# Patient Record
Sex: Male | Born: 1975 | Race: Asian | Hispanic: No | Marital: Married | Smoking: Former smoker
Health system: Southern US, Community
[De-identification: ages and names within clinical notes are randomized; demographics above are authoritative.]

## PROBLEM LIST (undated history)

## (undated) DIAGNOSIS — E785 Hyperlipidemia, unspecified: Secondary | ICD-10-CM

## (undated) DIAGNOSIS — I639 Cerebral infarction, unspecified: Secondary | ICD-10-CM

## (undated) DIAGNOSIS — J45909 Unspecified asthma, uncomplicated: Secondary | ICD-10-CM

## (undated) DIAGNOSIS — E119 Type 2 diabetes mellitus without complications: Secondary | ICD-10-CM

## (undated) HISTORY — PX: APPENDECTOMY: SHX54

## (undated) HISTORY — DX: Cerebral infarction, unspecified: I63.9

## (undated) HISTORY — PX: OPEN REDUCTION SHOULDER DISLOCATION: SUR900

## (undated) HISTORY — DX: Type 2 diabetes mellitus without complications: E11.9

## (undated) HISTORY — DX: Unspecified asthma, uncomplicated: J45.909

## (undated) HISTORY — PX: LEG SURGERY: SHX1003

## (undated) HISTORY — DX: Hyperlipidemia, unspecified: E78.5

---

## 2014-09-23 ENCOUNTER — Ambulatory Visit (INDEPENDENT_AMBULATORY_CARE_PROVIDER_SITE_OTHER): Payer: Managed Care, Other (non HMO) | Admitting: Urgent Care

## 2014-09-23 VITALS — BP 133/86 | HR 64 | Temp 97.9°F | Resp 20 | Ht 70.25 in | Wt 241.5 lb

## 2014-09-23 DIAGNOSIS — Z0000 Encounter for general adult medical examination without abnormal findings: Secondary | ICD-10-CM

## 2014-09-23 DIAGNOSIS — K5900 Constipation, unspecified: Secondary | ICD-10-CM

## 2014-09-23 DIAGNOSIS — M7631 Iliotibial band syndrome, right leg: Secondary | ICD-10-CM

## 2014-09-23 DIAGNOSIS — E669 Obesity, unspecified: Secondary | ICD-10-CM

## 2014-09-23 DIAGNOSIS — Z8679 Personal history of other diseases of the circulatory system: Secondary | ICD-10-CM

## 2014-09-23 LAB — COMPREHENSIVE METABOLIC PANEL
ALK PHOS: 41 U/L (ref 39–117)
ALT: 45 U/L (ref 0–53)
AST: 24 U/L (ref 0–37)
Albumin: 4.4 g/dL (ref 3.5–5.2)
BUN: 9 mg/dL (ref 6–23)
CO2: 30 mEq/L (ref 19–32)
Calcium: 8.9 mg/dL (ref 8.4–10.5)
Chloride: 103 mEq/L (ref 96–112)
Creat: 0.93 mg/dL (ref 0.50–1.35)
GLUCOSE: 97 mg/dL (ref 70–99)
Potassium: 3.9 mEq/L (ref 3.5–5.3)
SODIUM: 139 meq/L (ref 135–145)
Total Bilirubin: 0.5 mg/dL (ref 0.2–1.2)
Total Protein: 6.7 g/dL (ref 6.0–8.3)

## 2014-09-23 LAB — POCT URINALYSIS DIPSTICK
Bilirubin, UA: NEGATIVE
GLUCOSE UA: NEGATIVE
Ketones, UA: NEGATIVE
LEUKOCYTES UA: NEGATIVE
NITRITE UA: NEGATIVE
Protein, UA: NEGATIVE
Spec Grav, UA: 1.01
UROBILINOGEN UA: 0.2
pH, UA: 6

## 2014-09-23 LAB — CBC
HEMATOCRIT: 42.9 % (ref 39.0–52.0)
Hemoglobin: 14 g/dL (ref 13.0–17.0)
MCH: 26.4 pg (ref 26.0–34.0)
MCHC: 32.6 g/dL (ref 30.0–36.0)
MCV: 80.9 fL (ref 78.0–100.0)
MPV: 11.3 fL (ref 8.6–12.4)
Platelets: 202 10*3/uL (ref 150–400)
RBC: 5.3 MIL/uL (ref 4.22–5.81)
RDW: 13.6 % (ref 11.5–15.5)
WBC: 6.6 10*3/uL (ref 4.0–10.5)

## 2014-09-23 LAB — LIPID PANEL
CHOL/HDL RATIO: 6.3 ratio
Cholesterol: 200 mg/dL (ref 0–200)
HDL: 32 mg/dL — ABNORMAL LOW (ref >39)
LDL Cholesterol: 120 mg/dL — ABNORMAL HIGH (ref 0–99)
Triglycerides: 238 mg/dL — ABNORMAL HIGH (ref <150)
VLDL: 48 mg/dL — AB (ref 0–40)

## 2014-09-23 LAB — TSH: TSH: 1.943 u[IU]/mL (ref 0.350–4.500)

## 2014-09-23 NOTE — Progress Notes (Signed)
MRN: [id number]  Subjective:   Brian Reid is a 39 y.o. male presenting for annual physical exam, constipation, thigh pain and blood pressure.  Medical care team includes:  PCP: No PCP Per Patient, last annual physical exam was in 2015 in [place]. Vision: Last eye exam 1 year ago, no visual deficits Dental: Dental cleanings once a year, last time in [place], 2015 Specialists: None  [name] does not have any active problems on his problems list.  Concerns: Constipation - reports intermittent constipation, straining when he defecates, bloating, abdominal fullness/discomfort. Patient states his diet consists of [place] food: rice, curry, some vegetables. Denies history of hemorrhoids, bloody stool, family history of colon cancer.   Thigh pain - reports intermittent lateral right thigh pain, states that it gets worse while running on treadmill. Denies hip pain, back pain, shooting pain, numbness or tingling, previous back, hip or leg surgery. States that he tries to stretch out at times but has not generally felt relief.  History of high blood pressure - reports that ~2 years ago, patient was noted to have high blood pressure. He was under a lot of stress with his mother's passing. States that she had high blood pressure, diabetes and passed away from stroke at 39 y/o. Since then he has had full cardiac work up including ecg, ECHO and stress test all within normal limits. He denies dizziness, chest pain, shob, blurred vision, hematuria, heart racing, palpitations. He is currently taking a non-prescribed medicine from [place], Aten-25. States that this is for blood pressure and measures his BP multiple times a week at night, usually ranges 140's/90's. He is concerned about his blood pressure and whether or not to continue this medication.   Patient used to smoke 1ppd for 10 years, quit 2013. Drinks alcohol socially, 2 drinks per week. Lives with his wife, married since 2008, and has 2  children. Works IT at [employer].  Immunizations: Has gotten flu vaccines yearly, not this year due to moving from [place] to [place], last tetanus in 2015  Brian Reid's current medications include Aten-25. He has No Known Allergies  His past medical history includes hypertension, right shoulder dislocation and  has past surgical history that includes Open reduction shoulder dislocation.  ROS As in subjective.   Objective:   Vitals: BP 133/86 mmHg  Pulse 64  Temp(Src) 97.9 F (36.6 C) (Oral)  Resp 20  Ht 5' 10.25" (1.784 m)  Wt 241 lb 8 oz (109.544 kg)  BMI 34.42 kg/m2  SpO2 100%  Physical Exam  Constitutional: He is oriented to person, place, and time and well-developed, well-nourished, and in no distress.  HENT:  TM's intact bilaterally, no effusions or erythema. Nares patent, nasal turbinates pink and moist. Oropharynx clear, mucous membranes moist, dentition in good repair.   Eyes: Conjunctivae and EOM are normal. Pupils are equal, round, and reactive to light. Right eye exhibits no discharge. Left eye exhibits no discharge. No scleral icterus.  Neck: Normal range of motion. No thyromegaly present.  Cardiovascular: Normal rate, regular rhythm, normal heart sounds and intact distal pulses.  Exam reveals no gallop and no friction rub.   No murmur heard. Pulmonary/Chest: Effort normal and breath sounds normal. No stridor. No respiratory distress. He has no wheezes. He has no rales. He exhibits no tenderness.  Abdominal: Soft. Bowel sounds are normal. He exhibits no distension and no mass. There is no tenderness.  Genitourinary:  Patient declined GU exam.  Musculoskeletal: Normal range of motion. He exhibits  no edema or tenderness.  Lymphadenopathy:    He has no cervical adenopathy.  Neurological: He is alert and oriented to person, place, and time. He has normal reflexes.  Skin: Skin is warm and dry. No rash noted. No erythema.  Psychiatric: Mood and affect normal.   Results for orders  placed or performed in visit on 09/23/14 (from the past 24 hour(s))  CBC     Status: None   Collection Time: 09/23/14 10:31 AM  Result Value Ref Range   WBC 6.6 4.0 - 10.5 K/uL   RBC 5.30 4.22 - 5.81 MIL/uL   Hemoglobin 14.0 13.0 - 17.0 g/dL   HCT 42.9 39.0 - 52.0 %   MCV 80.9 78.0 - 100.0 fL   MCH 26.4 26.0 - 34.0 pg   MCHC 32.6 30.0 - 36.0 g/dL   RDW 13.6 11.5 - 15.5 %   Platelets 202 150 - 400 K/uL   MPV 11.3 8.6 - 12.4 fL   Narrative   Performed at:  [address]                [place], [place] [address]  TSH     Status: None   Collection Time: 09/23/14 10:31 AM  Result Value Ref Range   TSH 1.943 0.350 - 4.500 uIU/mL   Narrative   Performed at:  [address]                [place], [place] [address]  Comprehensive metabolic panel     Status: None   Collection Time: 09/23/14 10:31 AM  Result Value Ref Range   Sodium 139 135 - 145 mEq/L   Potassium 3.9 3.5 - 5.3 mEq/L   Chloride 103 96 - 112 mEq/L   CO2 30 19 - 32 mEq/L   Glucose, Bld 97 70 - 99 mg/dL   BUN 9 6 - 23 mg/dL   Creat 0.93 0.50 - 1.35 mg/dL   Total Bilirubin 0.5 0.2 - 1.2 mg/dL   Alkaline Phosphatase 41 39 - 117 U/L   AST 24 0 - 37 U/L   ALT 45 0 - 53 U/L   Total Protein 6.7 6.0 - 8.3 g/dL   Albumin 4.4 3.5 - 5.2 g/dL   Calcium 8.9 8.4 - 10.5 mg/dL   Narrative   Performed at:  [address]                [place], [place] [address]  Lipid panel     Status: Abnormal   Collection Time: 09/23/14 10:31 AM  Result Value Ref Range   Cholesterol 200 0 - 200 mg/dL   Triglycerides 238 (H) <150 mg/dL   HDL 32 (L) >39 mg/dL   Total CHOL/HDL Ratio 6.3 Ratio   VLDL 48 (H) 0 - 40 mg/dL   LDL Cholesterol 120 (H) 0 - 99 mg/dL   Narrative   Performed at:  [address]                [place],  [address]  POCT urinalysis  dipstick     Status: None   Collection Time: 09/23/14 10:32 AM  Result Value Ref Range   Color, UA yellow    Clarity, UA clear    Glucose, UA neg    Bilirubin, UA neg    Ketones, UA neg    Spec Grav, UA 1.010    Blood, UA trace    pH, UA 6.0    Protein, UA neg    Urobilinogen, UA 0.2    Nitrite, UA neg    Leukocytes, UA Negative    Assessment and Plan :   1. Annual physical exam - Patient is medically healthy, labs pending - Discussed healthy lifestyle, diet, exercise, preventative care, vaccinations, and addressed patient's concerns. Repeat annual exam in 1 year.  2. History of high blood pressure - Unreliable historian. Advised to stop Aten-25, check bp weekly, make dietary modifications, continue exercise within pain tolerance. Recheck BP in 1 month.  3. Obesity (BMI 30-39.9) - Diet and exercise as above.  4. Constipation, unspecified constipation type - Advised dietary modifications including increased fiber and water intake, regular exercise routine, use Miralax as needed for constipation  5. IT band syndrome, right - Advised sports rehab, consider PT if no improvement in 4 weeks   Jaynee Eagles, PA-C [facility] [phone] 09/23/2014 10:12 AM

## 2014-09-23 NOTE — Patient Instructions (Addendum)
- Please stop taking Aten 25 for blood pressure. I will recheck your blood pressure with you in 1 month or sooner if you start to have symptoms of chest pain, heart racing, dizziness, sudden persistent blurred vision. You may record your blood pressure measurements in the morning before breakfast once a week and bring your measurements to your follow up visit. Otherwise, I recommend a low salt diet, continue exercising (NOT ON THE TREADMILL) using the elliptical, exercise bike or swimming.   - For your constipation eat more fiber including leafy greens, green vegetables, drink at least 32 ounces of water daily. If your constipation continues, you may use MiraLax over the counter as needed. I will follow up with you in 1 month for this.  - Perform IT band exercises 5 sets with 10 repetitions each daily. You may use heat, cold pads, Tylenol or ibuprofen for discomfort. Make sure you take ibuprofen with food if you are going to use ibuprofen.  Iliotibial Band Syndrome with Rehab The iliotibial (IT) band is a tendon that connects the hip muscles to the shinbone (tibia) and to one of the bones of the pelvis (ileum). The IT band passes by the knee and is often irritated by the outer portion of the knee (lateral femoral condyle). A fluid filled sac (bursa) exists between the tendon and the bone, to cushion and reduce friction. Overuse of the tendon may cause excessive friction, which results in IT band syndrome. This condition involves inflammation of the bursa (bursitis) and/or inflammation of the IT band (tendinitis). SYMPTOMS   Pain, tenderness, swelling, warmth, or redness over the IT band, at the outer knee (above the joint).  Pain that travels up or down the thigh or leg.  Initially, pain at the beginning of an exercise, that decreases once warmed up. Eventually, pain throughout the activity, getting worse as the activity continues. May cause the athlete to stop in the middle of training or  competing.  Pain that gets worse when running down hills or stairs, on banked tracks, or next to the curb on the street.  Pain that increases when the foot of the affected leg hits the ground.  Possibly, a crackling sound (crepitation) when the tendon or bursa is moved or touched. CAUSES  IT band syndrome is caused by irritation of the IT band and the underlying bursa. This eventually results in inflammation and pain. IT band syndrome is an overuse injury.  RISK INCREASES WITH:  Sports with repetitive knee-bending activities (distance running, cycling).  Incorrect training techniques, including sudden changes in the intensity, frequency, or duration of training.  Not enough rest between workouts.  Poor strength and flexibility, especially a tight IT band.  Failure to warm up properly before activity.  Bow legs.  Arthritis of the knee. PREVENTION   Warm up and stretch properly before activity.  Allow for adequate recovery between workouts.  Maintain physical fitness:  Strength, flexibility, and endurance.  Cardiovascular fitness.  Learn and use proper training technique, including reducing running mileage, shortening stride, and avoiding running on hills and banked surfaces.  Wear arch supports (orthotics), if you have flat feet. PROGNOSIS  If treated properly, IT band syndrome usually goes away within 6 weeks of treatment. RELATED COMPLICATIONS   Longer healing time, if not properly treated, or if not given enough time to heal.  Recurring inflammation of the tendon and bursa, that may result in a chronic condition.  Recurring symptoms, if activity is resumed too soon, with overuse, with a  direct blow, or with poor training technique.  Inability to complete training or competition. TREATMENT  Treatment first involves the use of ice and medicine, to reduce pain and inflammation. The use of strengthening and stretching exercises may help reduce pain with activity.  These exercises may be performed at home or with a therapist. For individuals with flat feet, an arch support (orthotic) may be helpful. Some individuals find that wearing a knee sleeve or compression bandage around the knee during workouts provides some relief. Certain training techniques, such as adjusting stride length, avoiding running on hills or stairs, changing the direction you run on a circular or banked track, or changing the side of the road you run on, if you run next to the curb, may help decrease symptoms of IT band syndrome. Cyclists may need to change the seat height or foot position on their bicycles. An injection of cortisone into the bursa may be recommended. Surgery to remove the inflamed bursa and/or part of the IT band is only considered after at least 6 months of non-surgical treatment.  MEDICATION   If pain medicine is needed, nonsteroidal anti-inflammatory medicines (aspirin and ibuprofen), or other minor pain relievers (acetaminophen), are often advised.  Do not take pain medicine for 7 days before surgery.  Prescription pain relievers may be given, if your caregiver thinks they are needed. Use only as directed and only as much as you need.  Corticosteroid injections may be given by your caregiver. These injections should be reserved for the most serious cases, because they may only be given a certain number of times. HEAT AND COLD  Cold treatment (icing) should be applied for 10 to 15 minutes every 2 to 3 hours for inflammation and pain, and immediately after activity that aggravates your symptoms. Use ice packs or an ice massage.  Heat treatment may be used before performing stretching and strengthening activities prescribed by your caregiver, physical therapist, or athletic trainer. Use a heat pack or a warm water soak. SEEK MEDICAL CARE IF:   Symptoms get worse or do not improve in 2 to 4 weeks, despite treatment.  New, unexplained symptoms develop. (Drugs used in  treatment may produce side effects.) EXERCISES  RANGE OF MOTION (ROM) AND STRETCHING EXERCISES - Iliotibial Band Syndrome These exercises may help you when beginning to rehabilitate your injury. Your symptoms may go away with or without further involvement from your physician, physical therapist or athletic trainer. While completing these exercises, remember:   Restoring tissue flexibility helps normal motion to return to the joints. This allows healthier, less painful movement and activity.  An effective stretch should be held for at least 30 seconds.  A stretch should never be painful. You should only feel a gentle lengthening or release in the stretched tissue. STRETCH - Quadriceps, Prone   Lie on your stomach on a firm surface, such as a bed or padded floor.  Bend your right / left knee and grasp your ankle. If you are unable to reach your ankle or pant leg, use a belt around your foot to lengthen your reach.  Gently pull your heel toward your buttocks. Your knee should not slide out to the side. You should feel a stretch in the front of your thigh and knee.  Hold this position for __________ seconds. Repeat __________ times. Complete this stretch __________ times per day.  STRETCH - Iliotibial Band  On the floor or bed, lie on your side, so your right / left leg is on top.  Bend your knee and grab your ankle.  Slowly bring your knee back so that your thigh is in line with your trunk. Keep your heel at your buttocks and gently arch your back, so your head, shoulders and hips line up.  Slowly lower your leg so that your knee approaches the floor or bed, until you feel a gentle stretch on the outside of your right / left thigh. If you do not feel a stretch and your knee will not fall farther, place the heel of your opposite foot on top of your knee, and pull your thigh down farther.  Hold this stretch for __________ seconds. Repeat __________ times. Complete this stretch __________  times per day. STRENGTHENING EXERCISES - Iliotibial Band Syndrome Improving the flexibility of the IT band will best relieve your discomfort due to IT band syndrome. Strengthening exercises, however, can help improve both muscle endurance and joint mechanics, reducing the factors that can contribute to this condition. Your physician, physical therapist or athletic trainer may provide you with exercises that train specific muscle groups that are especially weak. The following exercises target muscles that are often weak in people who have IT band syndrome. STRENGTH - Hip Abductors, Straight Leg Raises  Be aware of your form throughout the entire exercise, so that you exercise the correct muscles. Poor form means that you are not strengthening the correct muscles.  Lie on your side, so that your head, shoulders, knee and hip line up. You may bend your lower knee to help maintain your balance. Your right / left leg should be on top.  Roll your hips slightly forward, so that your hips are stacked directly over each other and your right / left knee is facing forward.  Lift your top leg up 4-6 inches, leading with your heel. Be sure that your foot does not drift forward and that your knee does not roll toward the ceiling.  Hold this position for __________ seconds. You should feel the muscles in your outer hip lifting (you may not notice this until your leg begins to tire).  Slowly lower your leg to the starting position. Allow the muscles to fully relax before beginning the next repetition. Repeat __________ times. Complete this exercise __________ times per day.  STRENGTH - Quad/VMO, Isometric  Sit in a chair with your right / left knee slightly bent. With your fingertips, feel the VMO muscle (just above the inside of your knee). The VMO is important in controlling the position of your kneecap.  Keeping your fingertips on this muscle. Without actually moving your leg, attempt to drive your knee down,  as if straightening your leg. You should feel your VMO tense. If you have a difficult time, you may wish to try the same exercise on your healthy knee first.  Tense this muscle as hard as you can, without increasing any knee pain.  Hold for __________ seconds. Relax the muscles slowly and completely between each repetition. Repeat __________ times. Complete this exercise __________ times per day.  Document Released: 07/21/2005 Document Revised: 10/13/2011 Document Reviewed: 11/02/2008 [employer] Patient Information 2015 [place], [place]. This information is not intended to replace advice given to you by your health care provider. Make sure you discuss any questions you have with your health care provider.

## 2014-09-26 ENCOUNTER — Encounter: Payer: Self-pay | Admitting: Urgent Care

## 2014-10-10 ENCOUNTER — Telehealth: Payer: Self-pay

## 2014-10-10 NOTE — Telephone Encounter (Signed)
Pt LM on lab VM about labs Left message on machine to call back

## 2014-10-11 NOTE — Telephone Encounter (Signed)
Letter was sent 2/23 Spoke with pt. He received the letter yesterday and has no further questions.

## 2014-12-17 ENCOUNTER — Ambulatory Visit (INDEPENDENT_AMBULATORY_CARE_PROVIDER_SITE_OTHER): Payer: Managed Care, Other (non HMO) | Admitting: Emergency Medicine

## 2014-12-17 ENCOUNTER — Other Ambulatory Visit: Payer: Self-pay | Admitting: Physician Assistant

## 2014-12-17 ENCOUNTER — Ambulatory Visit (INDEPENDENT_AMBULATORY_CARE_PROVIDER_SITE_OTHER): Payer: Managed Care, Other (non HMO)

## 2014-12-17 VITALS — BP 146/84 | HR 59 | Temp 97.7°F | Resp 16 | Ht 69.75 in | Wt 243.0 lb

## 2014-12-17 DIAGNOSIS — M79671 Pain in right foot: Secondary | ICD-10-CM

## 2014-12-17 DIAGNOSIS — S93601A Unspecified sprain of right foot, initial encounter: Secondary | ICD-10-CM | POA: Diagnosis not present

## 2014-12-17 DIAGNOSIS — M79673 Pain in unspecified foot: Secondary | ICD-10-CM

## 2014-12-17 MED ORDER — TRAMADOL HCL 50 MG PO TABS: 50.0000 mg | ORAL_TABLET | Freq: Three times a day (TID) | ORAL | Status: DC | PRN

## 2014-12-17 MED ORDER — IBUPROFEN 600 MG PO TABS: 600.0000 mg | ORAL_TABLET | Freq: Three times a day (TID) | ORAL | Status: DC | PRN

## 2014-12-17 NOTE — Progress Notes (Signed)
   Subjective:    Patient ID: Brian Reid, male    DOB: [birth year], 39 y.o.   MRN: [id number]  HPI Patient presents for right foot pain. Does not recall any injury yesterday, but did play tennis yesterday morning. After game did not have any pain and did the days errands following game, however, woke up in the middle of the night with the lateral aspect and bottom of foot hurting. Pain does not radiate and denies numbness, weakness, swelling, gait change, or loss of fxn/ROM/sensation. Denies trauma or falls. Denies past surgeries or injuries to foot. NKDA.   Review of Systems  Constitutional: Negative.   Musculoskeletal: Positive for myalgias. Negative for joint swelling, arthralgias and gait problem.  Skin: Negative for color change.  Neurological: Negative for weakness and numbness.       Objective:   Physical Exam  Constitutional: He is oriented to person, place, and time. He appears well-developed and well-nourished. No distress.  Blood pressure 146/84, pulse 59, temperature 97.7 F (36.5 C), temperature source Oral, resp. rate 16, height 5' 9.75" (1.772 m), weight 243 lb (110.224 kg), SpO2 99 %.  HENT:  Head: Normocephalic and atraumatic.  Right Ear: External ear normal.  Left Ear: External ear normal.  Eyes: Conjunctivae are normal. Right eye exhibits no discharge. Left eye exhibits no discharge.  Pulmonary/Chest: Effort normal.  Musculoskeletal: Normal range of motion. He exhibits tenderness. He exhibits no edema.       Right ankle: Normal.       Left foot: There is tenderness. There is normal range of motion, no bony tenderness, no swelling, normal capillary refill, no crepitus, no deformity and no laceration.       Feet:  Neurological: He is alert and oriented to person, place, and time. He has normal strength and normal reflexes. He displays no atrophy. No cranial nerve deficit or sensory deficit. He exhibits normal muscle tone. Coordination normal.  Skin: Skin is warm and  dry. No rash noted. He is not diaphoretic. No erythema. No pallor.   [facility] reading (PRIMARY) by  Dr. Ouida Sills. No acute bony abnormalities.      Assessment & Plan:  1. Foot pain, right 2. Foot sprain, right, initial encounter Ice foot for 15-20 min 3-4x daily. Rest and elevated. Anticipatory guidance given. Patient and friend accompanying were very adamant that he received something stronger for pain or he would not be able to sleep. Did not think narcotic pain managment was appropriate for this Grade I sprain. PE benign save some tenderness. - DG Foot Complete Right; Future - ibuprofen (ADVIL,MOTRIN) 600 MG tablet; Take 1 tablet (600 mg total) by mouth every 8 (eight) hours as needed.  Dispense: 30 tablet; Refill: 0   Makinzee Durley PA-C  [facility] 12/17/2014 9:07 AM

## 2014-12-17 NOTE — Patient Instructions (Signed)
Foot Sprain The muscles and cord like structures which attach muscle to bone (tendons) that surround the feet are made up of units. A foot sprain can occur at the weakest spot in any of these units. This condition is most often caused by injury to or overuse of the foot, as from playing contact sports, or aggravating a previous injury, or from poor conditioning, or obesity. SYMPTOMS  Pain with movement of the foot.  Tenderness and swelling at the injury site.  Loss of strength is present in moderate or severe sprains. THE THREE GRADES OR SEVERITY OF FOOT SPRAIN ARE:  Mild (Grade I): Slightly pulled muscle without tearing of muscle or tendon fibers or loss of strength.  Moderate (Grade II): Tearing of fibers in a muscle, tendon, or at the attachment to bone, with small decrease in strength.  Severe (Grade III): Rupture of the muscle-tendon-bone attachment, with separation of fibers. Severe sprain requires surgical repair. Often repeating (chronic) sprains are caused by overuse. Sudden (acute) sprains are caused by direct injury or over-use. DIAGNOSIS  Diagnosis of this condition is usually by your own observation. If problems continue, a caregiver may be required for further evaluation and treatment. X-rays may be required to make sure there are not breaks in the bones (fractures) present. Continued problems may require physical therapy for treatment. PREVENTION  Use strength and conditioning exercises appropriate for your sport.  Warm up properly prior to working out.  Use athletic shoes that are made for the sport you are participating in.  Allow adequate time for healing. Early return to activities makes repeat injury more likely, and can lead to an unstable arthritic foot that can result in prolonged disability. Mild sprains generally heal in 3 to 10 days, with moderate and severe sprains taking 2 to 10 weeks. Your caregiver can help you determine the proper time required for  healing. HOME CARE INSTRUCTIONS   Apply ice to the injury for 15-20 minutes, 03-04 times per day. Put the ice in a plastic bag and place a towel between the bag of ice and your skin.  An elastic wrap (like an Ace bandage) may be used to keep swelling down.  Keep foot above the level of the heart, or at least raised on a footstool, when swelling and pain are present.  Try to avoid use other than gentle range of motion while the foot is painful. Do not resume use until instructed by your caregiver. Then begin use gradually, not increasing use to the point of pain. If pain does develop, decrease use and continue the above measures, gradually increasing activities that do not cause discomfort, until you gradually achieve normal use.  Use crutches if and as instructed, and for the length of time instructed.  Keep injured foot and ankle wrapped between treatments.  Massage foot and ankle for comfort and to keep swelling down. Massage from the toes up towards the knee.  Only take over-the-counter or prescription medicines for pain, discomfort, or fever as directed by your caregiver. SEEK IMMEDIATE MEDICAL CARE IF:   Your pain and swelling increase, or pain is not controlled with medications.  You have loss of feeling in your foot or your foot turns cold or blue.  You develop new, unexplained symptoms, or an increase of the symptoms that brought you to your caregiver. MAKE SURE YOU:   Understand these instructions.  Will watch your condition.  Will get help right away if you are not doing well or get worse. Document Released:  01/10/2002 Document Revised: 10/13/2011 Document Reviewed: 03/09/2008 ExitCare Patient Information 2015 [place], [place]. This information is not intended to replace advice given to you by your health care provider. Make sure you discuss any questions you have with your health care provider.

## 2014-12-19 NOTE — Progress Notes (Signed)
  Medical screening examination/treatment/procedure(s) were performed by non-physician practitioner and as supervising physician I was immediately available for consultation/collaboration.

## 2016-02-26 ENCOUNTER — Ambulatory Visit (INDEPENDENT_AMBULATORY_CARE_PROVIDER_SITE_OTHER): Payer: Managed Care, Other (non HMO) | Admitting: Physician Assistant

## 2016-02-26 ENCOUNTER — Encounter (HOSPITAL_COMMUNITY): Payer: Self-pay

## 2016-02-26 ENCOUNTER — Ambulatory Visit (HOSPITAL_COMMUNITY)
Admission: RE | Admit: 2016-02-26 | Discharge: 2016-02-26 | Disposition: A | Discharge status: Home / Self Care | Payer: Managed Care, Other (non HMO) | Source: Ambulatory Visit | Attending: Physician Assistant | Admitting: Physician Assistant

## 2016-02-26 VITALS — BP 126/80 | HR 72 | Temp 98.6°F | Resp 16 | Ht 70.0 in | Wt 244.0 lb

## 2016-02-26 DIAGNOSIS — Z1389 Encounter for screening for other disorder: Secondary | ICD-10-CM

## 2016-02-26 DIAGNOSIS — Z13220 Encounter for screening for lipoid disorders: Secondary | ICD-10-CM

## 2016-02-26 DIAGNOSIS — Z114 Encounter for screening for human immunodeficiency virus [HIV]: Secondary | ICD-10-CM

## 2016-02-26 DIAGNOSIS — G4482 Headache associated with sexual activity: Secondary | ICD-10-CM | POA: Diagnosis present

## 2016-02-26 DIAGNOSIS — Z0000 Encounter for general adult medical examination without abnormal findings: Secondary | ICD-10-CM | POA: Diagnosis not present

## 2016-02-26 DIAGNOSIS — Z131 Encounter for screening for diabetes mellitus: Secondary | ICD-10-CM

## 2016-02-26 DIAGNOSIS — R519 Headache, unspecified: Secondary | ICD-10-CM

## 2016-02-26 DIAGNOSIS — Z1329 Encounter for screening for other suspected endocrine disorder: Secondary | ICD-10-CM

## 2016-02-26 DIAGNOSIS — Z130 Encounter for screening for diseases of the blood and blood-forming organs and certain disorders involving the immune mechanism: Secondary | ICD-10-CM | POA: Diagnosis not present

## 2016-02-26 DIAGNOSIS — R51 Headache: Secondary | ICD-10-CM

## 2016-02-26 LAB — CBC
HCT: 44 % (ref 38.5–50.0)
HEMOGLOBIN: 14.8 g/dL (ref 13.2–17.1)
MCH: 26.7 pg — ABNORMAL LOW (ref 27.0–33.0)
MCHC: 33.6 g/dL (ref 32.0–36.0)
MCV: 79.3 fL — ABNORMAL LOW (ref 80.0–100.0)
MPV: 11.3 fL (ref 7.5–12.5)
PLATELETS: 200 10*3/uL (ref 140–400)
RBC: 5.55 MIL/uL (ref 4.20–5.80)
RDW: 13.5 % (ref 11.0–15.0)
WBC: 8.3 10*3/uL (ref 3.8–10.8)

## 2016-02-26 LAB — HIV ANTIBODY (ROUTINE TESTING W REFLEX): HIV 1&2 Ab, 4th Generation: NONREACTIVE

## 2016-02-26 LAB — LIPID PANEL
CHOL/HDL RATIO: 5.9 ratio — AB (ref <=5.0)
CHOLESTEROL: 199 mg/dL (ref 125–200)
HDL: 34 mg/dL — ABNORMAL LOW (ref >=40)
LDL Cholesterol: 125 mg/dL (ref <130)
Triglycerides: 200 mg/dL — ABNORMAL HIGH (ref <150)
VLDL: 40 mg/dL — ABNORMAL HIGH (ref <30)

## 2016-02-26 LAB — BASIC METABOLIC PANEL
BUN: 7 mg/dL (ref 7–25)
CALCIUM: 9.4 mg/dL (ref 8.6–10.3)
CO2: 28 mmol/L (ref 20–31)
CREATININE: 1.07 mg/dL (ref 0.60–1.35)
Chloride: 103 mmol/L (ref 98–110)
Glucose, Bld: 103 mg/dL — ABNORMAL HIGH (ref 65–99)
Potassium: 4.5 mmol/L (ref 3.5–5.3)
Sodium: 141 mmol/L (ref 135–146)

## 2016-02-26 LAB — TSH: TSH: 2.1 m[IU]/L (ref 0.40–4.50)

## 2016-02-26 MED ORDER — IOPAMIDOL (ISOVUE-370) INJECTION 76%
50.0000 mL | Freq: Once | INTRAVENOUS | Status: AC | PRN
  Administered 2016-02-26: 50 mL via INTRAVENOUS

## 2016-02-26 NOTE — Progress Notes (Signed)
02/26/2016 11:40 AM   DOB: 1976/05/03 / MRN: [id number]  SUBJECTIVE:  Brian Reid is a 40 y.o. male presenting for an annual physical.  He has some complaints he would like to cover today as well.   He complains of HAs waking him from sleep and reports having some post orgasmic HAs over the last three weeks. The early morning HA's he describes as an intense ache that last about 45 minutes then resolves.  He is able to go back to sleep after the HA.  The post orgasmic HA is similar in severity and presentation.  He has a family history of stroke in his grandmother. He denies any weakness and change in sensation with the HA. He has not tried any medication with regard to the HA.     Depression screen [facility] 2/9 02/26/2016  Decreased Interest 0  Down, Depressed, Hopeless 0  PHQ - 2 Score 0     He has No Known Allergies.   He  has no past medical history on file.    He  reports that he has quit smoking. He has never used smokeless tobacco. He reports that he drinks about 1.2 oz of alcohol per week . He reports that he does not use drugs. He  has no sexual activity history on file. The patient  has a past surgical history that includes Open reduction shoulder dislocation.  His family history includes Diabetes in his father and mother; Heart disease in his father; Hypertension in his mother and sister.  Review of Systems  Constitutional: Negative for chills and fever.  Eyes: Negative for blurred vision, double vision, photophobia and pain.  Gastrointestinal: Negative for nausea.  Neurological: Positive for dizziness (with HA only) and headaches. Negative for speech change and focal weakness.    Problem list and medications reviewed and updated by myself where necessary, and exist elsewhere in the encounter.   OBJECTIVE:  BP 126/80   Pulse 72   Temp 98.6 F (37 C) (Oral)   Resp 16   Ht 5' 10"  (1.778 m)   Wt 244 lb (110.7 kg)   SpO2 99%   BMI 35.01 kg/m   Physical Exam    Constitutional: He is oriented to person, place, and time. He appears well-developed and well-nourished. No distress.  Eyes: Conjunctivae and EOM are normal. Pupils are equal, round, and reactive to light.  Fundoscopic exam:      The right eye shows no AV nicking, no exudate, no hemorrhage and no papilledema. The right eye shows no red reflex.       The left eye shows no AV nicking, no exudate, no hemorrhage and no papilledema. The left eye shows no red reflex.  Cardiovascular: Normal rate, regular rhythm and normal heart sounds.   Pulmonary/Chest: Effort normal.  Abdominal: Soft. Bowel sounds are normal.  Musculoskeletal: Normal range of motion.  Neurological: He is alert and oriented to person, place, and time. He has normal reflexes. He displays no atrophy, no tremor and normal reflexes. No cranial nerve deficit or sensory deficit. He exhibits normal muscle tone. He displays no seizure activity. Coordination and gait normal. GCS eye subscore is 4. GCS verbal subscore is 5. GCS motor subscore is 6.  Reflex Scores:      Tricep reflexes are 2+ on the right side and 2+ on the left side.      Bicep reflexes are 2+ on the right side and 2+ on the left side.      Brachioradialis  reflexes are 2+ on the right side and 2+ on the left side.      Patellar reflexes are 2+ on the right side and 2+ on the left side.      Achilles reflexes are 2+ on the right side and 2+ on the left side. RAM, Heel to Shin, Heel and Toe walking intact.   Skin: Skin is warm and dry. He is not diaphoretic.  Psychiatric: He has a normal mood and affect.    Lab Results  Component Value Date   CHOL 200 09/23/2014   HDL 32 (L) 09/23/2014   LDLCALC 120 (H) 09/23/2014   TRIG 238 (H) 09/23/2014   CHOLHDL 6.3 09/23/2014   Lab Results  Component Value Date   CREATININE 0.93 09/23/2014   Lab Results  Component Value Date   TSH 1.943 09/23/2014   Lab Results  Component Value Date   WBC 6.6 09/23/2014   HGB 14.0  09/23/2014   HCT 42.9 09/23/2014   MCV 80.9 09/23/2014   PLT 202 09/23/2014   Lab Results  Component Value Date   NA 139 09/23/2014   K 3.9 09/23/2014   CL 103 09/23/2014   CO2 30 09/23/2014   Lab Results  Component Value Date   ALT 45 09/23/2014   AST 24 09/23/2014   ALKPHOS 41 09/23/2014   BILITOT 0.5 09/23/2014     No results found for this or any previous visit (from the past 72 hour(s)).  No results found.  ASSESSMENT AND PLAN  Tor was seen today for annual exam.  Diagnoses and all orders for this visit:  Annual physical exam  Screening for HIV (human immunodeficiency virus) -     HIV antibody  Screening for nephropathy -     Basic metabolic panel  Screening for deficiency anemia -     CBC  Thyroid disorder screening -     TSH  Lipid screening -     Lipid panel  Nonintractable episodic headache, unspecified headache type  Screening for diabetes mellitus -     Hemoglobin A1c  Orgasmic headache: He has a normal neurological exam and some concerning symptoms on HPI.  Will evaluate today with a neural image and will send him to neurology for further work up in about a week.   -     CT ANGIO HEAD W OR WO CONTRAST; Future -     Ambulatory referral to Neurology    The patient was advised to call or return to clinic if he does not see an improvement in symptoms, or to seek the care of the closest emergency department if he worsens with the above plan.   Philis Fendt, MHS, PA-C [facility] 02/26/2016 11:40 AM

## 2016-02-26 NOTE — Patient Instructions (Signed)
     IF you received an x-ray today, you will receive an invoice from [facility]. Please contact [facility] at [phone] with questions or concerns regarding your invoice.   IF you received labwork today, you will receive an invoice from [facility]. Please contact [facility] at [phone] with questions or concerns regarding your invoice.   Our billing staff will not be able to assist you with questions regarding bills from these companies.  You will be contacted with the lab results as soon as they are available. The fastest way to get your results is to activate your My Chart account. Instructions are located on the last page of this paperwork. If you have not heard from us regarding the results in 2 weeks, please contact this office.      

## 2016-02-27 LAB — HEMOGLOBIN A1C
Hgb A1c MFr Bld: 6 % — ABNORMAL HIGH (ref <5.7)
MEAN PLASMA GLUCOSE: 126 mg/dL

## 2016-02-28 ENCOUNTER — Encounter: Payer: Self-pay | Admitting: Emergency Medicine

## 2016-02-28 ENCOUNTER — Telehealth: Payer: Self-pay | Admitting: Emergency Medicine

## 2016-02-28 NOTE — Telephone Encounter (Signed)
-----   Message from Tereasa Coop, PA-C sent at 02/27/2016  1:48 PM EDT ----- Please call and advise that patient is pre-diabetic and at risk of becoming diabetic.  Please advise weight loss, prudent diet, and 150 mins per week of moderate physical activity, comparable to brisk walking. Will will need to monitor your lipid panel. Recheck in 6 months.  Philis Fendt, MS, PA-C 1:48 PM, 02/27/2016

## 2016-03-05 ENCOUNTER — Ambulatory Visit (INDEPENDENT_AMBULATORY_CARE_PROVIDER_SITE_OTHER): Payer: Managed Care, Other (non HMO) | Admitting: Diagnostic Neuroimaging

## 2016-03-05 ENCOUNTER — Encounter: Payer: Self-pay | Admitting: Diagnostic Neuroimaging

## 2016-03-05 VITALS — BP 134/79 | HR 64 | Ht 70.0 in | Wt 243.4 lb

## 2016-03-05 DIAGNOSIS — R0683 Snoring: Secondary | ICD-10-CM | POA: Diagnosis not present

## 2016-03-05 DIAGNOSIS — M542 Cervicalgia: Secondary | ICD-10-CM

## 2016-03-05 DIAGNOSIS — G4719 Other hypersomnia: Secondary | ICD-10-CM

## 2016-03-05 DIAGNOSIS — M5416 Radiculopathy, lumbar region: Secondary | ICD-10-CM

## 2016-03-05 DIAGNOSIS — R51 Headache: Secondary | ICD-10-CM | POA: Diagnosis not present

## 2016-03-05 DIAGNOSIS — G4486 Cervicogenic headache: Secondary | ICD-10-CM

## 2016-03-05 DIAGNOSIS — G4484 Primary exertional headache: Secondary | ICD-10-CM

## 2016-03-05 NOTE — Patient Instructions (Signed)
Thank you for coming to see [place] at [facility]. I hope we have been able to provide you high quality care today.  You may receive a patient satisfaction survey over the next few weeks. We would appreciate your feedback and comments so that we may continue to improve ourselves and the health of our patients.  - I will check sleep study consult - gradually increase activities - consider MRI brain if symptoms continue   ~~~~~~~~~~~~~~~~~~~~~~~~~~~~~~~~~~~~~~~~~~~~~~~~~~~~~~~~~~~~~~~~~  DR. Saylor Murry'S GUIDE TO HAPPY AND HEALTHY LIVING These are some of my general health and wellness recommendations. Some of them may apply to you better than others. Please use common sense as you try these suggestions and feel free to ask me any questions.   ACTIVITY/FITNESS Mental, social, emotional and physical stimulation are very important for brain and body health. Try learning a new activity (arts, music, language, sports, games).  Keep moving your body to the best of your abilities. You can do this at home, inside or outside, the park, community center, gym or anywhere you like. Consider a physical therapist or personal trainer to get started. Consider the app Sworkit. Fitness trackers such as smart-watches, smart-phones or Fitbits can help as well.   NUTRITION Eat more plants: colorful vegetables, nuts, seeds and berries.  Eat less sugar, salt, preservatives and processed foods.  Avoid toxins such as cigarettes and alcohol.  Drink water when you are thirsty. Warm water with a slice of lemon is an excellent morning drink to start the day.  Consider these websites for more information The Nutrition Source (https://www.Kdyn-hernandez.biz/) Precision Nutrition (WindowBlog.ch)   RELAXATION Consider practicing mindfulness meditation or other relaxation techniques such as deep breathing, prayer, yoga, tai chi, massage. See website  mindful.org or the apps Headspace or Calm to help get started.   SLEEP Try to get at least 7-8+ hours sleep per day. Regular exercise and reduced caffeine will help you sleep better. Practice good sleep hygeine techniques. See website sleep.org for more information.   PLANNING Prepare estate planning, living will, healthcare POA documents. Sometimes this is best planned with the help of an attorney. Theconversationproject.org and agingwithdignity.org are excellent resources.

## 2016-03-05 NOTE — Progress Notes (Signed)
[facility]  PATIENT: Brian Reid DOB: Jul 07, 1976  REFERRING CLINICIAN: Philis Fendt, PA-c HISTORY FROM: patient  REASON FOR VISIT: new consult    HISTORICAL  CHIEF COMPLAINT:  Chief Complaint  Patient presents with  . Headache    rm 7, New Pt, "headache (back of head thru front) usually during heavy work outs; invloved with my neck and back; woke up a couple of times with heavy headache"    HISTORY OF PRESENT ILLNESS:   40 year old male with new onset headaches starting 3 weeks ago. Patient reports intermittent episodes of pain radiating from neck, occipital region and chin to the top of his head. At aches last 10-20 minutes a time. He said 4-5 events, 3 provoked with exertion and 2 unprovoked. No vision changes, nausea, vomiting, sensitivity to light or sound. One event occurred with sexual activity. 2 other events occurred with exercise activity.  6-7 months ago patient started having increasing neck pain. For past 3 months patient has been doing increased driving commute, over one hour each way to [facility].  Patient has trying some mild exercise up to 20 minutes at a time and has been able to tolerate this without triggering headaches.  Patient had a different type of headache in 2015 which was self-limited.  No other recent traumas, accidents, infections, change in sleep or stress. Patient does have history of snoring and intermittent daytime fatigue.   REVIEW OF SYSTEMS: Full 14 system review of systems performed and negative with exception of: Only as per history of present illness.  ALLERGIES: No Known Allergies  HOME MEDICATIONS: No outpatient prescriptions prior to visit.   No facility-administered medications prior to visit.     PAST MEDICAL HISTORY: No past medical history on file.  PAST SURGICAL HISTORY: Past Surgical History:  Procedure Laterality Date  . LEG SURGERY     age 20  . OPEN REDUCTION SHOULDER DISLOCATION      age 29    FAMILY HISTORY: Family History  Problem Relation Age of Onset  . Diabetes Mother   . Hypertension Mother   . Diabetes Father   . Heart disease Father   . Hypertension Sister     SOCIAL HISTORY:  Social History   Social History  . Marital status: Married    Spouse name: N/A  . Number of children: 2  . Years of education: post grad   Occupational History  .      IT professional   Social History Main Topics  . Smoking status: Former Research scientist (life sciences)  . Smokeless tobacco: Never Used     Comment: 03/05/16 "very occasional now"  . Alcohol use 1.2 oz/week    2 Standard drinks or equivalent per week     Comment: social  . Drug use: No  . Sexual activity: Not on file   Other Topics Concern  . Not on file   Social History Narrative   Lives with family   No caffeine     PHYSICAL EXAM  GENERAL EXAM/CONSTITUTIONAL: Vitals:  Vitals:   03/05/16 0938 03/05/16 0949  BP: 131/90 134/79  Pulse: 65 64  Weight: 243 lb 6.4 oz (110.4 kg)   Height: 5' 10"  (1.778 m)      Body mass index is 34.92 kg/m.  Visual Acuity Screening   Right eye Left eye Both eyes  Without correction: 20/30 20/20   With correction:        Patient is in no distress; well developed, nourished and groomed; neck  is supple  CARDIOVASCULAR:  Examination of carotid arteries is normal; no carotid bruits  Regular rate and rhythm, no murmurs  Examination of peripheral vascular system by observation and palpation is normal  EYES:  Ophthalmoscopic exam of optic discs and posterior segments is normal; no papilledema or hemorrhages  MUSCULOSKELETAL:  Gait, strength, tone, movements noted in Neurologic exam below  NEUROLOGIC: MENTAL STATUS:  No flowsheet data found.  awake, alert, oriented to person, place and time  recent and remote memory intact  normal attention and concentration  language fluent, comprehension intact, naming intact,   fund of knowledge appropriate  CRANIAL  NERVE:   2nd - no papilledema on fundoscopic exam  2nd, 3rd, 4th, 6th - pupils equal and reactive to light, visual fields full to confrontation, extraocular muscles intact, no nystagmus  5th - facial sensation symmetric  7th - facial strength symmetric  8th - hearing intact  9th - palate elevates symmetrically, uvula midline  11th - shoulder shrug symmetric  12th - tongue protrusion midline  MOTOR:   normal bulk and tone, full strength in the BUE, BLE  SENSORY:   normal and symmetric to light touch, temperature, vibration; EXCEPT DECR PP AND TEMP IN LEFT FOOT  COORDINATION:   finger-nose-finger, fine finger movements normal  REFLEXES:   deep tendon reflexes present and symmetric  GAIT/STATION:   narrow based gait    DIAGNOSTIC DATA (LABS, IMAGING, TESTING) - I reviewed patient records, labs, notes, testing and imaging myself where available.  Lab Results  Component Value Date   WBC 8.3 02/26/2016   HGB 14.8 02/26/2016   HCT 44.0 02/26/2016   MCV 79.3 (L) 02/26/2016   PLT 200 02/26/2016      Component Value Date/Time   NA 141 02/26/2016 1047   K 4.5 02/26/2016 1047   CL 103 02/26/2016 1047   CO2 28 02/26/2016 1047   GLUCOSE 103 (H) 02/26/2016 1047   BUN 7 02/26/2016 1047   CREATININE 1.07 02/26/2016 1047   CALCIUM 9.4 02/26/2016 1047   PROT 6.7 09/23/2014 1031   ALBUMIN 4.4 09/23/2014 1031   AST 24 09/23/2014 1031   ALT 45 09/23/2014 1031   ALKPHOS 41 09/23/2014 1031   BILITOT 0.5 09/23/2014 1031   Lab Results  Component Value Date   CHOL 199 02/26/2016   HDL 34 (L) 02/26/2016   LDLCALC 125 02/26/2016   TRIG 200 (H) 02/26/2016   CHOLHDL 5.9 (H) 02/26/2016   Lab Results  Component Value Date   HGBA1C 6.0 (H) 02/26/2016   No results found for: [id number] Lab Results  Component Value Date   TSH 2.10 02/26/2016    02/26/16 CTA head [I reviewed images myself and agree with interpretation. -VRP]  Negative head CTA.  No evidence of  aneurysm or mass.    ASSESSMENT AND PLAN  40 y.o. year old male here with new onset headaches since past 3 weeks, with several events occurring with exercise or sexual activity exertion. Neurologic examination otherwise unremarkable. Patient now able to tolerate physical activity without triggering headaches. Reassured patient encouraged to gradually increase activity. We'll check sleep study to rule out obstructive sleep apnea. If headaches or symptoms continue then may check MRI brain and other testing.   Ddx: primary exertional headaches vs sleep apnea vs cervicogenic headache  1. Exertional headache   2. Neck pain   3. Cervicogenic headache   4. Snoring   5. Excessive daytime sleepiness   6. Lumbar radiculopathy      PLAN: -  gradually increase activity - check sleep study - if HA continue, then will check MRI brain  Orders Placed This Encounter  Procedures  . Ambulatory referral to Sleep Studies   Return in about 3 months (around 06/05/2016).  I reviewed images, labs, notes, records myself. I summarized findings and reviewed with patient, for this high risk condition (new onset exertional headache) requiring high complexity decision making.    Penni Bombard, MD 02/03/2201, 54:27 AM Certified in Neurology, Neurophysiology and Neuroimaging  [facility] [address], [place], [place] [address] [phone]

## 2016-03-26 ENCOUNTER — Institutional Professional Consult (permissible substitution): Payer: Managed Care, Other (non HMO) | Admitting: Neurology

## 2016-03-27 ENCOUNTER — Ambulatory Visit (INDEPENDENT_AMBULATORY_CARE_PROVIDER_SITE_OTHER): Payer: Managed Care, Other (non HMO) | Admitting: Neurology

## 2016-03-27 ENCOUNTER — Encounter: Payer: Self-pay | Admitting: Neurology

## 2016-03-27 VITALS — BP 140/78 | HR 78 | Resp 18 | Ht 70.0 in | Wt 241.0 lb

## 2016-03-27 DIAGNOSIS — E669 Obesity, unspecified: Secondary | ICD-10-CM

## 2016-03-27 DIAGNOSIS — R519 Headache, unspecified: Secondary | ICD-10-CM

## 2016-03-27 DIAGNOSIS — G4710 Hypersomnia, unspecified: Secondary | ICD-10-CM | POA: Diagnosis not present

## 2016-03-27 DIAGNOSIS — R0683 Snoring: Secondary | ICD-10-CM | POA: Diagnosis not present

## 2016-03-27 DIAGNOSIS — G4486 Cervicogenic headache: Secondary | ICD-10-CM

## 2016-03-27 DIAGNOSIS — R51 Headache: Secondary | ICD-10-CM

## 2016-03-27 NOTE — Progress Notes (Signed)
Subjective:    Patient ID: Haaris Metallo is a 40 y.o. male.  HPI     Star Age, MD, PhD [facility] [address], [address]O. [place], [place] [address]  Dear Bonnita Levan,   I saw your patient, Zaidin Blyden, upon your kind request in my clinic today for initial consultation of his sleep disorder, in particular, concern for underlying obstructive sleep apnea. The patient is unaccompanied today. As you know, Mr. Mcwherter is a 40 year old right-handed gentleman with an underlying medical history of neck pain, exertional headache, lumbar radiculopathy and obesity, who reports snoring and morning headaches. I reviewed your office note from 03/05/2016. He reports that in the last 3 weeks things have improved some as he started sleeping on the floor with a thin pillow. Neck pain is about the same however. He has some radiation to the top of the head and a abnormal sensation under her scalp sometimes. He still snores but not as loud he reports, he is not aware of any apneic pauses while asleep, his wife has not reported recently but has reported apneas in the past. He is restless but denies any restless leg syndrome, he used to wake up a lot with jerking of his body but not so much since he has been sleeping a little better on the ground. He works in [place], he has a 1 hour and 20 minute commute one-way typically, bedtime between 10:30 and 11:30 on average, wake time around 6:30. His Epworth sleepiness score is 4 out of 24 today, his fatigue score is 32 out of 63. He is not aware of any family history of OSA. He quit smoking in 2012, drinks alcohol very occasionally, coffee once a day. He denies any nighttime reflux, nocturia, or postnasal drip. He lives at home with his wife and 2 young children, ages 40 and 40. He works in Engineer, technical sales. He does not typically like to take any naps.  His Past Medical History Is Significant For: Past Medical History:  Diagnosis Date  . Hyperlipidemia      His Past Surgical History Is Significant For: Past Surgical History:  Procedure Laterality Date  . LEG SURGERY     age 40  . OPEN REDUCTION SHOULDER DISLOCATION     age 40    His Family History Is Significant For: Family History  Problem Relation Age of Onset  . Diabetes Mother   . Hypertension Mother   . Stroke Mother   . Diabetes Father   . Heart disease Father   . Hypertension Sister     His Social History Is Significant For: Social History   Social History  . Marital status: Married    Spouse name: N/A  . Number of children: 2  . Years of education: post grad   Occupational History  .      IT professional   Social History Main Topics  . Smoking status: Former Research scientist (life sciences)  . Smokeless tobacco: Never Used     Comment: 03/05/16 "very occasional now"  . Alcohol use 1.2 oz/week    2 Standard drinks or equivalent per week     Comment: social  . Drug use: No  . Sexual activity: Not Asked   Other Topics Concern  . None   Social History Narrative   Lives with family   Drinks 1 cup of coffee a day     His Allergies Are:  No Known Allergies:   His Current Medications Are:  Outpatient Encounter Prescriptions as  of 03/27/2016  Medication Sig  . Omega-3 Fatty Acids (FISH OIL CONCENTRATE PO) Take by mouth.   No facility-administered encounter medications on file as of 03/27/2016.   :  Review of Systems:  Out of a complete 14 point review of systems, all are reviewed and negative with the exception of these symptoms as listed below: Review of Systems  Neurological:       Patient reports that in the last month he has started to sleep on the floor due to uncomfortable mattress. Snoring reports, headache/pain, neck pain, wakes up feeling tired, daytime tiredness.   Epworth Sleepiness Scale 0= would never doze 1= slight chance of dozing 2= moderate chance of dozing 3= high chance of dozing  Sitting and reading:1 Watching TV:0 Sitting inactive in a public place  (ex. Theater or meeting):0 As a passenger in a car for an hour without a break:1 Lying down to rest in the afternoon:1 Sitting and talking to someone:0 Sitting quietly after lunch (no alcohol):1 In a car, while stopped in traffic: 0 Total:4   Objective:  Neurologic Exam  Physical Exam Physical Examination:   Vitals:   03/27/16 1406  BP: 140/78  Pulse: 78  Resp: 18   General Examination: The patient is a very pleasant 40 y.o. male in no acute distress. He appears well-developed and well-nourished and well groomed.   HEENT: Normocephalic, atraumatic, pupils are equal, round and reactive to light and accommodation. Funduscopic exam is normal with sharp disc margins noted. Extraocular tracking is good without limitation to gaze excursion or nystagmus noted. Normal smooth pursuit is noted. Hearing is grossly intact. Tympanic membranes are clear bilaterally. Face is symmetric with normal facial animation and normal facial sensation. Speech is clear with no dysarthria noted. There is no hypophonia. There is no lip, neck/head, jaw or voice tremor. Neck is supple with full range of passive and active motion. There are no carotid bruits on auscultation. Oropharynx exam reveals: mild mouth dryness, good dental hygiene and moderate airway crowding, due to redundant small palate and larger uvula. Mallampati is class II. Tongue protrudes centrally and palate elevates symmetrically. Tonsils are 1+ in size. Neck size is 17 3/8 inches. He has a Mild overbite. Nasal inspection reveals no significant nasal mucosal bogginess or redness and no septal deviation.   Chest: Clear to auscultation without wheezing, rhonchi or crackles noted.  Heart: S1+S2+0, regular and normal without murmurs, rubs or gallops noted.   Abdomen: Soft, non-tender and non-distended with normal bowel sounds appreciated on auscultation.  Extremities: There is no pitting edema in the distal lower extremities bilaterally. Pedal pulses  are intact.  Skin: Warm and dry without trophic changes noted. There are no varicose veins.  Musculoskeletal: exam reveals no obvious joint deformities, tenderness or joint swelling or erythema.   Neurologically:  Mental status: The patient is awake, alert and oriented in all 4 spheres. His immediate and remote memory, attention, language skills and fund of knowledge are appropriate. There is no evidence of aphasia, agnosia, apraxia or anomia. Speech is clear with normal prosody and enunciation. Thought process is linear. Mood is normal and affect is normal.  Cranial nerves II - XII are as described above under HEENT exam. In addition: shoulder shrug is normal with equal shoulder height noted. Motor exam: Normal bulk, strength and tone is noted. There is no drift, tremor or rebound. Romberg is negative. Reflexes are 1-2+ throughout. Fine motor skills and coordination: intact in the UEs and LEs. Cerebellar testing: No dysmetria or  intention tremor. There is no truncal or gait ataxia.  Sensory exam: intact to light touch in the upper and lower extremities.  Gait, station and balance: He stands easily. No veering to one side is noted. No leaning to one side is noted. Posture is age-appropriate and stance is narrow based. Gait shows normal stride length and normal pace. No problems turning are noted. Tandem walk is unremarkable.   Assessment and Plan:  In summary, Tru Rana is a very pleasant 40 y.o.-year old male  with an underlying medical history of neck pain, exertional headache, lumbar radiculopathy and obesity, whose history and physical exam are concerning for obstructive sleep apnea (OSA). I had a long chat with the patient about my findings and the diagnosis of OSA, its prognosis and treatment options. We talked about medical treatments, surgical interventions and non-pharmacological approaches. I explained in particular the risks and ramifications of untreated moderate to severe OSA,  especially with respect to developing cardiovascular disease down the Road, including congestive heart failure, difficult to treat hypertension, cardiac arrhythmias, or stroke. Even type 2 diabetes has, in part, been linked to untreated OSA. Symptoms of untreated OSA include daytime sleepiness, memory problems, mood irritability and mood disorder such as depression and anxiety, lack of energy, as well as recurrent headaches, especially morning headaches. We talked about trying to maintain a healthy lifestyle in general, as well as the importance of weight control. I encouraged the patient to eat healthy, exercise daily and keep well hydrated, to keep a scheduled bedtime and wake time routine, to not skip any meals and eat healthy snacks in between meals. I advised the patient not to drive when feeling sleepy. I recommended the following at this time: sleep study with potential positive airway pressure titration. (We will score hypopneas at 4% and split the sleep study into diagnostic and treatment portion, if the estimated. 2 hour AHI is >15/h).   However, the patient would like to wait a little before scheduling his sleep study as he feels that he is beginning to feel better.  I explained the sleep test procedure to the patient and also outlined possible surgical and non-surgical treatment options of OSA, including the use of a custom-made dental device (which would require a referral to a specialist dentist or oral surgeon), upper airway surgical options, such as pillar implants, radiofrequency surgery, tongue base surgery, and UPPP (which would involve a referral to an ENT surgeon). Rarely, jaw surgery such as mandibular advancement may be considered.  I also explained the CPAP treatment option to the patient, who indicated that he would be willing to try CPAP if the need arises. I explained the importance of being compliant with PAP treatment, not only for insurance purposes but primarily to improve His  symptoms, and for the patient's long term health benefit, including to reduce His cardiovascular risks. I answered all his questions today and the patient was in agreement. I would like to see him back after the sleep study is completed and encouraged him to call with any interim questions, concerns, problems or updates.   Thank you very much for allowing me to participate in the care of this nice patient. If I can be of any further assistance to you please do not hesitate to talk to me.  Sincerely,   Star Age, MD, PhD

## 2016-03-27 NOTE — Patient Instructions (Signed)

## 2016-04-02 ENCOUNTER — Ambulatory Visit (HOSPITAL_COMMUNITY)
Admission: RE | Admit: 2016-04-02 | Discharge: 2016-04-02 | Disposition: A | Discharge status: Home / Self Care | Payer: Managed Care, Other (non HMO) | Source: Ambulatory Visit | Attending: Physician Assistant | Admitting: Physician Assistant

## 2016-04-02 ENCOUNTER — Ambulatory Visit (INDEPENDENT_AMBULATORY_CARE_PROVIDER_SITE_OTHER): Payer: Managed Care, Other (non HMO) | Admitting: Physician Assistant

## 2016-04-02 ENCOUNTER — Observation Stay (HOSPITAL_COMMUNITY)
Admission: EM | Admit: 2016-04-02 | Discharge: 2016-04-03 | Disposition: A | Discharge status: Home / Self Care | Payer: Managed Care, Other (non HMO) | Attending: General Surgery | Admitting: General Surgery

## 2016-04-02 ENCOUNTER — Encounter: Payer: Self-pay | Admitting: Physician Assistant

## 2016-04-02 ENCOUNTER — Emergency Department (HOSPITAL_COMMUNITY): Payer: Managed Care, Other (non HMO) | Admitting: Certified Registered"

## 2016-04-02 ENCOUNTER — Encounter (HOSPITAL_COMMUNITY): Payer: Self-pay

## 2016-04-02 ENCOUNTER — Ambulatory Visit (INDEPENDENT_AMBULATORY_CARE_PROVIDER_SITE_OTHER): Payer: Managed Care, Other (non HMO)

## 2016-04-02 ENCOUNTER — Encounter (HOSPITAL_COMMUNITY)
Admission: EM | Disposition: A | Discharge status: Home / Self Care | Payer: Self-pay | Source: Home / Self Care | Attending: Emergency Medicine

## 2016-04-02 VITALS — BP 136/84 | HR 93 | Temp 100.4°F | Resp 17 | Ht 70.5 in | Wt 240.0 lb

## 2016-04-02 DIAGNOSIS — K353 Acute appendicitis with localized peritonitis, without perforation or gangrene: Secondary | ICD-10-CM

## 2016-04-02 DIAGNOSIS — D72828 Other elevated white blood cell count: Secondary | ICD-10-CM | POA: Diagnosis not present

## 2016-04-02 DIAGNOSIS — D72829 Elevated white blood cell count, unspecified: Secondary | ICD-10-CM

## 2016-04-02 DIAGNOSIS — R1084 Generalized abdominal pain: Secondary | ICD-10-CM

## 2016-04-02 DIAGNOSIS — Z87891 Personal history of nicotine dependence: Secondary | ICD-10-CM | POA: Diagnosis not present

## 2016-04-02 DIAGNOSIS — K3580 Unspecified acute appendicitis: Secondary | ICD-10-CM | POA: Diagnosis present

## 2016-04-02 HISTORY — PX: LAPAROSCOPIC APPENDECTOMY: SHX408

## 2016-04-02 LAB — POCT CBC
Granulocyte percent: 81.3 %G — AB (ref 37–80)
HCT, POC: 42.2 % — AB (ref 43.5–53.7)
HEMOGLOBIN: 14.6 g/dL (ref 14.1–18.1)
LYMPH, POC: 2.2 (ref 0.6–3.4)
MCH, POC: 26.9 pg — AB (ref 27–31.2)
MCHC: 34.6 g/dL (ref 31.8–35.4)
MCV: 77.8 fL — AB (ref 80–97)
MID (cbc): 0.9 (ref 0–0.9)
MPV: 9 fL (ref 0–99.8)
POC Granulocyte: 13.4 — AB (ref 2–6.9)
POC LYMPH PERCENT: 13.1 %L (ref 10–50)
POC MID %: 5.6 %M (ref 0–12)
Platelet Count, POC: 166 10*3/uL (ref 142–424)
RBC: 5.43 M/uL (ref 4.69–6.13)
RDW, POC: 13.3 %
WBC: 16.5 10*3/uL — AB (ref 4.6–10.2)

## 2016-04-02 LAB — COMPLETE METABOLIC PANEL WITH GFR
ALT: 32 U/L (ref 9–46)
AST: 18 U/L (ref 10–40)
Albumin: 4.3 g/dL (ref 3.6–5.1)
Alkaline Phosphatase: 53 U/L (ref 40–115)
BUN: 7 mg/dL (ref 7–25)
CHLORIDE: 99 mmol/L (ref 98–110)
CO2: 26 mmol/L (ref 20–31)
CREATININE: 0.97 mg/dL (ref 0.60–1.35)
Calcium: 9.1 mg/dL (ref 8.6–10.3)
GFR, Est African American: >89 mL/min (ref >=60)
GFR, Est Non African American: >89 mL/min (ref >=60)
GLUCOSE: 135 mg/dL — AB (ref 65–99)
Potassium: 4.2 mmol/L (ref 3.5–5.3)
SODIUM: 137 mmol/L (ref 135–146)
Total Bilirubin: 1.3 mg/dL — ABNORMAL HIGH (ref 0.2–1.2)
Total Protein: 6.8 g/dL (ref 6.1–8.1)

## 2016-04-02 LAB — I-STAT CHEM 8, ED
BUN: 7 mg/dL (ref 6–20)
CALCIUM ION: 0.98 mmol/L — AB (ref 1.15–1.40)
Chloride: 99 mmol/L — ABNORMAL LOW (ref 101–111)
Creatinine, Ser: 1 mg/dL (ref 0.61–1.24)
GLUCOSE: 120 mg/dL — AB (ref 65–99)
HCT: 47 % (ref 39.0–52.0)
HEMOGLOBIN: 16 g/dL (ref 13.0–17.0)
POTASSIUM: 3.7 mmol/L (ref 3.5–5.1)
SODIUM: 138 mmol/L (ref 135–145)
TCO2: 27 mmol/L (ref 0–100)

## 2016-04-02 LAB — LIPASE: LIPASE: 15 U/L (ref 7–60)

## 2016-04-02 SURGERY — APPENDECTOMY, LAPAROSCOPIC
Anesthesia: General | Site: Abdomen

## 2016-04-02 MED ORDER — ONDANSETRON HCL 4 MG/2ML IJ SOLN
INTRAMUSCULAR | Status: DC | PRN
  Administered 2016-04-02: 4 mg via INTRAVENOUS

## 2016-04-02 MED ORDER — LACTATED RINGERS IV SOLN: INTRAVENOUS | Status: DC

## 2016-04-02 MED ORDER — PROMETHAZINE HCL 25 MG/ML IJ SOLN: 6.2500 mg | INTRAMUSCULAR | Status: DC | PRN

## 2016-04-02 MED ORDER — PROPOFOL 10 MG/ML IV BOLUS
INTRAVENOUS | Status: AC
  Filled 2016-04-02: qty 20

## 2016-04-02 MED ORDER — FENTANYL CITRATE (PF) 250 MCG/5ML IJ SOLN
INTRAMUSCULAR | Status: AC
  Filled 2016-04-02: qty 5

## 2016-04-02 MED ORDER — LACTATED RINGERS IV SOLN
INTRAVENOUS | Status: DC | PRN
  Administered 2016-04-02: 23:00:00 via INTRAVENOUS

## 2016-04-02 MED ORDER — FENTANYL CITRATE (PF) 100 MCG/2ML IJ SOLN
INTRAMUSCULAR | Status: DC | PRN
  Administered 2016-04-02: 50 ug via INTRAVENOUS
  Administered 2016-04-02 (×2): 100 ug via INTRAVENOUS
  Administered 2016-04-02: 50 ug via INTRAVENOUS

## 2016-04-02 MED ORDER — DEXAMETHASONE SODIUM PHOSPHATE 10 MG/ML IJ SOLN
INTRAMUSCULAR | Status: DC | PRN
  Administered 2016-04-02: 10 mg via INTRAVENOUS

## 2016-04-02 MED ORDER — DEXTROSE 5 % IV SOLN
2.0000 g | Freq: Once | INTRAVENOUS | Status: AC
  Administered 2016-04-02: 2 g via INTRAVENOUS
  Filled 2016-04-02: qty 2

## 2016-04-02 MED ORDER — METRONIDAZOLE IN NACL 5-0.79 MG/ML-% IV SOLN
500.0000 mg | Freq: Once | INTRAVENOUS | Status: AC
  Administered 2016-04-02: 500 mg via INTRAVENOUS
  Filled 2016-04-02: qty 100

## 2016-04-02 MED ORDER — ROCURONIUM BROMIDE 100 MG/10ML IV SOLN
INTRAVENOUS | Status: DC | PRN
  Administered 2016-04-02: 50 mg via INTRAVENOUS

## 2016-04-02 MED ORDER — BUPIVACAINE-EPINEPHRINE 0.25% -1:200000 IJ SOLN
INTRAMUSCULAR | Status: DC | PRN
  Administered 2016-04-02: 20 mL

## 2016-04-02 MED ORDER — SUGAMMADEX SODIUM 200 MG/2ML IV SOLN
INTRAVENOUS | Status: DC | PRN
  Administered 2016-04-02: 200 mg via INTRAVENOUS

## 2016-04-02 MED ORDER — ROCURONIUM BROMIDE 100 MG/10ML IV SOLN
INTRAVENOUS | Status: AC
  Filled 2016-04-02: qty 1

## 2016-04-02 MED ORDER — DEXAMETHASONE SODIUM PHOSPHATE 10 MG/ML IJ SOLN
INTRAMUSCULAR | Status: AC
  Filled 2016-04-02: qty 1

## 2016-04-02 MED ORDER — SUCCINYLCHOLINE CHLORIDE 20 MG/ML IJ SOLN
INTRAMUSCULAR | Status: DC | PRN
  Administered 2016-04-02: 140 mg via INTRAVENOUS

## 2016-04-02 MED ORDER — MEPERIDINE HCL 50 MG/ML IJ SOLN: 6.2500 mg | INTRAMUSCULAR | Status: DC | PRN

## 2016-04-02 MED ORDER — HYDROMORPHONE HCL 1 MG/ML IJ SOLN
0.2500 mg | INTRAMUSCULAR | Status: DC | PRN
  Administered 2016-04-03: 0.5 mg via INTRAVENOUS

## 2016-04-02 MED ORDER — ONDANSETRON HCL 4 MG/2ML IJ SOLN
INTRAMUSCULAR | Status: AC
  Filled 2016-04-02: qty 2

## 2016-04-02 MED ORDER — LABETALOL HCL 5 MG/ML IV SOLN
INTRAVENOUS | Status: DC | PRN
  Administered 2016-04-02 (×2): 5 mg via INTRAVENOUS

## 2016-04-02 MED ORDER — LACTATED RINGERS IR SOLN
Status: DC | PRN
  Administered 2016-04-02: 1000 mL

## 2016-04-02 MED ORDER — IOPAMIDOL (ISOVUE-300) INJECTION 61%
100.0000 mL | Freq: Once | INTRAVENOUS | Status: AC | PRN
  Administered 2016-04-02: 100 mL via INTRAVENOUS

## 2016-04-02 MED ORDER — LACTATED RINGERS IV SOLN: INTRAVENOUS | Status: DC | PRN

## 2016-04-02 MED ORDER — DIATRIZOATE MEGLUMINE & SODIUM 66-10 % PO SOLN: 15.0000 mL | Freq: Once | ORAL | Status: DC

## 2016-04-02 MED ORDER — SODIUM CHLORIDE 0.9 % IV SOLN
INTRAVENOUS | Status: DC | PRN
  Administered 2016-04-02: 22:00:00 via INTRAVENOUS

## 2016-04-02 MED ORDER — SODIUM CHLORIDE 0.9 % IV BOLUS (SEPSIS)
500.0000 mL | Freq: Once | INTRAVENOUS | Status: AC
  Administered 2016-04-02: 500 mL via INTRAVENOUS

## 2016-04-02 MED ORDER — PROPOFOL 10 MG/ML IV BOLUS
INTRAVENOUS | Status: DC | PRN
  Administered 2016-04-02: 50 mg via INTRAVENOUS
  Administered 2016-04-02: 250 mg via INTRAVENOUS
  Administered 2016-04-02: 50 mg via INTRAVENOUS

## 2016-04-02 MED ORDER — 0.9 % SODIUM CHLORIDE (POUR BTL) OPTIME
TOPICAL | Status: DC | PRN
  Administered 2016-04-02: 1000 mL

## 2016-04-02 MED ORDER — MIDAZOLAM HCL 2 MG/2ML IJ SOLN
INTRAMUSCULAR | Status: AC
  Filled 2016-04-02: qty 2

## 2016-04-02 MED ORDER — MIDAZOLAM HCL 5 MG/5ML IJ SOLN
INTRAMUSCULAR | Status: DC | PRN
  Administered 2016-04-02: 2 mg via INTRAVENOUS

## 2016-04-02 MED ORDER — BUPIVACAINE-EPINEPHRINE (PF) 0.25% -1:200000 IJ SOLN
INTRAMUSCULAR | Status: AC
  Filled 2016-04-02: qty 30

## 2016-04-02 SURGICAL SUPPLY — 35 items
APPLIER CLIP ROT 10 11.4 M/L (STAPLE)
CLIP APPLIE ROT 10 11.4 M/L (STAPLE) IMPLANT
COVER SURGICAL LIGHT HANDLE (MISCELLANEOUS) ×2 IMPLANT
CUTTER FLEX LINEAR 45M (STAPLE) ×2 IMPLANT
DECANTER SPIKE VIAL GLASS SM (MISCELLANEOUS) ×2 IMPLANT
DRAPE LAPAROSCOPIC ABDOMINAL (DRAPES) ×2 IMPLANT
DRAPE UTILITY XL STRL (DRAPES) ×2 IMPLANT
ELECT PENCIL ROCKER SW 15FT (MISCELLANEOUS) ×2 IMPLANT
ELECT REM PT RETURN 9FT ADLT (ELECTROSURGICAL) ×2
ELECTRODE REM PT RTRN 9FT ADLT (ELECTROSURGICAL) ×1 IMPLANT
ENDOLOOP SUT PDS II  0 18 (SUTURE)
ENDOLOOP SUT PDS II 0 18 (SUTURE) IMPLANT
GLOVE BIO SURGEON STRL SZ7.5 (GLOVE) ×2 IMPLANT
GOWN STRL REUS W/ TWL XL LVL3 (GOWN DISPOSABLE) ×1 IMPLANT
GOWN STRL REUS W/TWL XL LVL3 (GOWN DISPOSABLE) ×5 IMPLANT
IRRIG SUCT STRYKERFLOW 2 WTIP (MISCELLANEOUS) ×2
IRRIGATION SUCT STRKRFLW 2 WTP (MISCELLANEOUS) ×1 IMPLANT
IV LACTATED RINGERS 1000ML (IV SOLUTION) ×2 IMPLANT
KIT BASIN OR (CUSTOM PROCEDURE TRAY) ×2 IMPLANT
LIQUID BAND (GAUZE/BANDAGES/DRESSINGS) ×2 IMPLANT
NS IRRIG 1000ML POUR BTL (IV SOLUTION) ×2 IMPLANT
POUCH SPECIMEN RETRIEVAL 10MM (ENDOMECHANICALS) ×2 IMPLANT
RELOAD 45 VASCULAR/THIN (ENDOMECHANICALS) IMPLANT
RELOAD STAPLE TA45 3.5 REG BLU (ENDOMECHANICALS) IMPLANT
SHEARS HARMONIC ACE PLUS 36CM (ENDOMECHANICALS) ×2 IMPLANT
SOLUTION ANTI FOG 6CC (MISCELLANEOUS) ×2 IMPLANT
SUT MNCRL AB 4-0 PS2 18 (SUTURE) ×2 IMPLANT
TOWEL OR 17X26 10 PK STRL BLUE (TOWEL DISPOSABLE) ×2 IMPLANT
TRAY FOLEY W/METER SILVER 14FR (SET/KITS/TRAYS/PACK) IMPLANT
TRAY FOLEY W/METER SILVER 16FR (SET/KITS/TRAYS/PACK) ×2 IMPLANT
TRAY LAPAROSCOPIC (CUSTOM PROCEDURE TRAY) ×2 IMPLANT
TROCAR BLADELESS OPT 5 100 (ENDOMECHANICALS) ×2 IMPLANT
TROCAR XCEL BLUNT TIP 100MML (ENDOMECHANICALS) ×2 IMPLANT
TROCAR XCEL NON-BLD 5MMX100MML (ENDOMECHANICALS) ×2 IMPLANT
TUBING INSUF HEATED (TUBING) ×2 IMPLANT

## 2016-04-02 NOTE — Op Note (Signed)
04/02/2016  11:50 PM  PATIENT:  Brian Reid  40 y.o. male  PRE-OPERATIVE DIAGNOSIS:  acute appendicitis  POST-OPERATIVE DIAGNOSIS:  acute appendicitis  PROCEDURE:  Procedure(s): APPENDECTOMY LAPAROSCOPIC (N/A)  SURGEON:  Surgeon(s) and Role:    * Jovita Kussmaul, MD - Primary  PHYSICIAN ASSISTANT:   ASSISTANTS: none   ANESTHESIA:   general  EBL:  Total I/O In: 500 [I.V.:500] Out: 150 [Urine:125; Blood:25]  BLOOD ADMINISTERED:none  DRAINS: none   LOCAL MEDICATIONS USED:  MARCAINE     SPECIMEN:  Source of Specimen:  appendix  DISPOSITION OF SPECIMEN:  PATHOLOGY  COUNTS:  YES  TOURNIQUET:  * No tourniquets in log *  DICTATION: .Dragon Dictation   After informed consent was obtained patient was brought to the operating room placed in the supine position on the operating room table. After adequate induction of general anesthesia the patient's abdomen was prepped with ChloraPrep, allowed to dry, and draped in usual sterile manner. The area below the umbilicus was infiltrated with quarter percent Marcaine. A small incision was made with a 15 blade knife. This incision was carried down through the subcutaneous tissue bluntly with a hemostat and Army-Navy retractors until the linea alba was identified. The linea alba was incised with a 15 blade knife. Each side was grasped Coker clamps and elevated anteriorly. The preperitoneal space was probed bluntly with a hemostat until the peritoneum was opened and access was gained to the abdominal cavity. A 0 Vicryl purse string stitch was placed in the fascia surrounding the opening. A Hassan cannula was placed through the opening and anchored in place with the previously placed Vicryl purse string stitch. The laparoscope was placed through the [facility] cannula. The abdomen was insufflated with carbon dioxide without difficulty. Next the suprapubic area was infiltrated with quarter percent Marcaine. A small incision was made with a 15 blade  knife. A 5 mm port was placed bluntly through this incision into the abdominal cavity. A site was then chosen in the upper abdomen for placement of a 5 mm port. The area was infiltrated with quarter percent Marcaine. A small stab incision was made with a 15 blade knife. A 5 mm port was placed bluntly through this incision and the abdominal cavity under direct vision. The laparoscope was then moved to the suprapubic port. Using a Glassman grasper and harmonic scalpel the right lower quadrant was inspected. The appendix was readily identified. The appendix was elevated anteriorly and the mesoappendix was taken down sharply with the harmonic scalpel. Once the base of the appendix where it joined the cecum was identified and cleared of any tissue then a laparoscopic GIA blue load 6 row stapler was placed through the [facility] cannula. The stapler was placed across the base of the appendix clamped and fired thereby dividing the base of the appendix between staple lines. A laparoscopic bag was then inserted through the [facility] cannula. The appendix was placed within the bag and the bag was sealed. The abdomen was then irrigated with copious amounts of saline until the effluent was clear. No other abnormalities were noted. The appendix and bag were removed with the [facility] cannula through the infraumbilical port without difficulty. The fascial defect was closed with the previously placed Vicryl pursestring stitch as well as with another interrupted 0 Vicryl figure-of-eight stitch. The rest of the ports were removed under direct vision and were found to be hemostatic. The gas was allowed to escape. The skin incisions were closed with interrupted 4-0 Monocryl subcuticular stitches.  Dermabond dressings were applied. The patient tolerated the procedure well. At the end of the case all needle sponge and instrument counts were correct. The patient was then awakened and taken to recovery in stable condition.  PLAN OF CARE: Admit for  overnight observation  PATIENT DISPOSITION:  PACU - hemodynamically stable.   Delay start of Pharmacological VTE agent (>24hrs) due to surgical blood loss or risk of bleeding: no

## 2016-04-02 NOTE — ED Provider Notes (Signed)
[facility] DEPT Provider Note   CSN: [id number] Arrival date & time: 04/02/16  1854     History   Chief Complaint Chief Complaint  Patient presents with  . appendicitis    HPI Brian Reid is a 40 y.o. male.  The history is provided by the patient.  Patient presents with appendicitis. Outpatient CT shows appendicitis no abscess. He's had 4 day history of abdominal pain with some constipation. States he also has had some pain with bowel movements. He's had fevers. His had nausea. Last meal was yesterday afternoon. Has had no appetite. Seen at urgent care and had lab work that showed elevated white count of 17 CT scan done and was sent here. Former smoker. Denies drug allergies. Pain started and upper abdomen and now is more right lower quadrant.  Past Medical History:  Diagnosis Date  . Hyperlipidemia     There are no active problems to display for this patient.   Past Surgical History:  Procedure Laterality Date  . LEG SURGERY     age 69  . OPEN REDUCTION SHOULDER DISLOCATION     age 60       Home Medications    Prior to Admission medications   Medication Sig Start Date End Date Taking? Authorizing Provider  Omega-3 Fatty Acids (FISH OIL CONCENTRATE PO) Take by mouth.    Historical Provider, MD    Family History Family History  Problem Relation Age of Onset  . Diabetes Mother   . Hypertension Mother   . Stroke Mother   . Diabetes Father   . Heart disease Father   . Hypertension Sister     Social History Social History  Substance Use Topics  . Smoking status: Former Research scientist (life sciences)  . Smokeless tobacco: Never Used     Comment: 03/05/16 "very occasional now"  . Alcohol use 1.2 oz/week    2 Standard drinks or equivalent per week     Comment: social     Allergies   Review of patient's allergies indicates no known allergies.   Review of Systems Review of Systems  Constitutional: Positive for appetite change, fatigue and fever.  HENT: Negative for  trouble swallowing.   Respiratory: Negative for cough and shortness of breath.   Cardiovascular: Negative for chest pain.  Gastrointestinal: Positive for abdominal pain, constipation and nausea. Negative for anal bleeding and vomiting.  Endocrine: Negative for polyuria.  Genitourinary: Negative for dysuria and flank pain.  Musculoskeletal: Negative for back pain.  Neurological: Negative for weakness and headaches.  Psychiatric/Behavioral: Negative for confusion.     Physical Exam Updated Vital Signs BP 130/73 (BP Location: Right Arm)   Pulse 109   Temp 100.9 F (38.3 C) (Oral)   Resp 18   Ht 5' 10"  (1.778 m)   Wt 240 lb (108.9 kg)   SpO2 100%   BMI 34.44 kg/m   Physical Exam  Constitutional: He appears well-developed.  HENT:  Head: Atraumatic.  Eyes: EOM are normal.  Neck: Neck supple.  Cardiovascular: Normal rate.   Pulmonary/Chest: Effort normal.  Abdominal: There is tenderness.  Tenderness in right lower quadrant and left lower quadrant. No hernias palpated.  Musculoskeletal: Normal range of motion.  Neurological: He is alert.  Skin: Skin is warm.  Psychiatric: He has a normal mood and affect.     ED Treatments / Results  Labs (all labs ordered are listed, but only abnormal results are displayed) Labs Reviewed  I-STAT CHEM 8, ED    EKG  EKG Interpretation  None       Radiology Ct Abdomen Pelvis W Contrast  Result Date: 04/02/2016 CLINICAL DATA:  Abdominal pain. EXAM: CT ABDOMEN AND PELVIS WITH CONTRAST TECHNIQUE: Multidetector CT imaging of the abdomen and pelvis was performed using the standard protocol following bolus administration of intravenous contrast. CONTRAST:  129m ISOVUE-300 IOPAMIDOL (ISOVUE-300) INJECTION 61% COMPARISON:  None. FINDINGS: Lower chest:  No acute findings. Hepatobiliary: No masses or other significant abnormality. Pancreas: No mass, inflammatory changes, or other significant abnormality. Spleen: Within normal limits in size and  appearance. Adrenals/Urinary Tract: Normal adrenalNo masses identified. No evidence of hydronephrosis. Stomach/Bowel: No bowel dilatation or bowel wall thickening. Dilated appendix with mucosal enhancement measuring 15 mm in diameter. Severe surrounding periappendiceal inflammatory changes. No focal fluid collection. No pneumatosis, pneumoperitoneum or portal venous gas. Vascular/Lymphatic: No pathologically enlarged lymph nodes. No evidence of abdominal aortic aneurysm. Reproductive: No mass or other significant abnormality. Other: None. Musculoskeletal:  No suspicious bone lesions identified. IMPRESSION: 1. Findings consistent with acute appendicitis. No focal fluid collection to suggest an abscess. Electronically Signed   By: HKathreen Devoid  On: 04/02/2016 18:36   Dg Abd 2 Views  Result Date: 04/02/2016 CLINICAL DATA:  Left upper and lower quadrant abdominal pain. Tightness in the abdomen. EXAM: ABDOMEN - 2 VIEW COMPARISON:  None. FINDINGS: The bowel gas pattern is normal. There is no evidence of free air. No radio-opaque calculi or other significant radiographic abnormality is seen. IMPRESSION: Negative abdominal radiographs. Electronically Signed   By: CSan MorelleM.D.   On: 04/02/2016 14:37    Procedures Procedures (including critical care time)  Medications Ordered in ED Medications  sodium chloride 0.9 % bolus 500 mL (not administered)  cefTRIAXone (ROCEPHIN) 2 g in dextrose 5 % 50 mL IVPB (not administered)    And  metroNIDAZOLE (FLAGYL) IVPB 500 mg (not administered)     Initial Impression / Assessment and Plan / ED Course  I have reviewed the triage vital signs and the nursing notes.  Pertinent labs & imaging results that were available during my care of the patient were reviewed by me and considered in my medical decision making (see chart for details).  Clinical Course    Patient with CT proven appendicitis. White count elevated. Discussed with Dr. TMarlou Starks Who will see  the patient in the ER.  Final Clinical Impressions(s) / ED Diagnoses   Final diagnoses:  Acute appendicitis with localized peritonitis    New Prescriptions New Prescriptions   No medications on file     NDavonna Belling MD 04/02/16 2000

## 2016-04-02 NOTE — H&P (Signed)
Brian Reid is an 40 y.o. male.   Chief Complaint: abdominal pain HPI:  The patient is a 40 year old male who presents with abdominal pain that started yesterday afternoon. The pain was located across his lower abdomen. The pain worsened during the night and is now localized to the right lower quadrant. He has had some low-grade fevers over the last 24 hours. He is also had some nausea and vomiting. In the emergency department and a CT scan was obtained that shows acute appendicitis but no evidence of perforation.  Past Medical History:  Diagnosis Date  . Hyperlipidemia     Past Surgical History:  Procedure Laterality Date  . LEG SURGERY     age 89  . OPEN REDUCTION SHOULDER DISLOCATION     age 89    Family History  Problem Relation Age of Onset  . Diabetes Mother   . Hypertension Mother   . Stroke Mother   . Diabetes Father   . Heart disease Father   . Hypertension Sister    Social History:  reports that he has quit smoking. He has never used smokeless tobacco. He reports that he drinks about 1.2 oz of alcohol per week . He reports that he does not use drugs.  Allergies: No Known Allergies   (Not in a hospital admission)  Results for orders placed or performed during the hospital encounter of 04/02/16 (from the past 48 hour(s))  I-stat Chem 8, ED     Status: Abnormal   Collection Time: 04/02/16  8:25 PM  Result Value Ref Range   Sodium 138 135 - 145 mmol/L   Potassium 3.7 3.5 - 5.1 mmol/L   Chloride 99 (L) 101 - 111 mmol/L   BUN 7 6 - 20 mg/dL   Creatinine, Ser 1.00 0.61 - 1.24 mg/dL   Glucose, Bld 120 (H) 65 - 99 mg/dL   Calcium, Ion 0.98 (L) 1.15 - 1.40 mmol/L   TCO2 27 0 - 100 mmol/L   Hemoglobin 16.0 13.0 - 17.0 g/dL   HCT 47.0 39.0 - 52.0 %   Ct Abdomen Pelvis W Contrast  Result Date: 04/02/2016 CLINICAL DATA:  Abdominal pain. EXAM: CT ABDOMEN AND PELVIS WITH CONTRAST TECHNIQUE: Multidetector CT imaging of the abdomen and pelvis was performed using the  standard protocol following bolus administration of intravenous contrast. CONTRAST:  12m ISOVUE-300 IOPAMIDOL (ISOVUE-300) INJECTION 61% COMPARISON:  None. FINDINGS: Lower chest:  No acute findings. Hepatobiliary: No masses or other significant abnormality. Pancreas: No mass, inflammatory changes, or other significant abnormality. Spleen: Within normal limits in size and appearance. Adrenals/Urinary Tract: Normal adrenalNo masses identified. No evidence of hydronephrosis. Stomach/Bowel: No bowel dilatation or bowel wall thickening. Dilated appendix with mucosal enhancement measuring 15 mm in diameter. Severe surrounding periappendiceal inflammatory changes. No focal fluid collection. No pneumatosis, pneumoperitoneum or portal venous gas. Vascular/Lymphatic: No pathologically enlarged lymph nodes. No evidence of abdominal aortic aneurysm. Reproductive: No mass or other significant abnormality. Other: None. Musculoskeletal:  No suspicious bone lesions identified. IMPRESSION: 1. Findings consistent with acute appendicitis. No focal fluid collection to suggest an abscess. Electronically Signed   By: HKathreen Devoid  On: 04/02/2016 18:36   Dg Abd 2 Views  Result Date: 04/02/2016 CLINICAL DATA:  Left upper and lower quadrant abdominal pain. Tightness in the abdomen. EXAM: ABDOMEN - 2 VIEW COMPARISON:  None. FINDINGS: The bowel gas pattern is normal. There is no evidence of free air. No radio-opaque calculi or other significant radiographic abnormality is seen. IMPRESSION: Negative abdominal  radiographs. Electronically Signed   By: San Morelle M.D.   On: 04/02/2016 14:37    Review of Systems  Constitutional: Positive for fever.  HENT: Negative.   Eyes: Negative.   Respiratory: Negative.   Cardiovascular: Negative.   Gastrointestinal: Positive for abdominal pain, nausea and vomiting.  Genitourinary: Negative.   Musculoskeletal: Negative.   Skin: Negative.   Neurological: Negative.    Endo/Heme/Allergies: Negative.   Psychiatric/Behavioral: Negative.     Blood pressure 130/73, pulse 109, temperature 100.9 F (38.3 C), temperature source Oral, resp. rate 18, height 5' 10"  (1.778 m), weight 108.9 kg (240 lb), SpO2 100 %. Physical Exam  Constitutional: He is oriented to person, place, and time. He appears well-developed and well-nourished.  HENT:  Head: Normocephalic and atraumatic.  Eyes: Conjunctivae and EOM are normal. Pupils are equal, round, and reactive to light.  Neck: Normal range of motion. Neck supple.  Cardiovascular: Normal rate, regular rhythm and normal heart sounds.   Respiratory: Effort normal and breath sounds normal.  GI: Soft.  There is moderate tenderness in the RLQ  Musculoskeletal: Normal range of motion.  Neurological: He is alert and oriented to person, place, and time.  Skin: Skin is warm and dry.  Psychiatric: He has a normal mood and affect. His behavior is normal.     Assessment/Plan  The patient appears to have acute appendicitis. Because of the risk of perforation and sepsis I think he would benefit from having the appendix removed. I have discussed with him in detail the risks and benefits of the operation to remove the appendix as well as some of the technical aspects and he understands and wishes to proceed.  [name], MD 04/02/2016, 9:35 PM

## 2016-04-02 NOTE — Anesthesia Preprocedure Evaluation (Addendum)
Anesthesia Evaluation  Patient identified by MRN, date of birth, ID band Patient awake    Reviewed: Allergy & Precautions, NPO status , Patient's Chart, lab work & pertinent test results  Airway Mallampati: I  TM Distance: >3 FB Neck ROM: Full    Dental  (+) Teeth Intact, Dental Advisory Given   Pulmonary former smoker,    breath sounds clear to auscultation       Cardiovascular negative cardio ROS   Rhythm:Regular Rate:Normal     Neuro/Psych negative neurological ROS  negative psych ROS   GI/Hepatic negative GI ROS, Neg liver ROS,   Endo/Other  negative endocrine ROS  Renal/GU negative Renal ROS  negative genitourinary   Musculoskeletal negative musculoskeletal ROS (+)   Abdominal   Peds negative pediatric ROS (+)  Hematology negative hematology ROS (+)   Anesthesia Other Findings   Reproductive/Obstetrics negative OB ROS                            Lab Results  Component Value Date   WBC 16.5 (A) 04/02/2016   HGB 16.0 04/02/2016   HCT 47.0 04/02/2016   MCV 77.8 (A) 04/02/2016   PLT 200 02/26/2016   Lab Results  Component Value Date   CREATININE 1.00 04/02/2016   BUN 7 04/02/2016   NA 138 04/02/2016   K 3.7 04/02/2016   CL 99 (L) 04/02/2016   CO2 28 02/26/2016   No results found for: INR, PROTIME   Anesthesia Physical Anesthesia Plan  ASA: II and emergent  Anesthesia Plan: General   Post-op Pain Management:    Induction: Intravenous, Rapid sequence and Cricoid pressure planned  Airway Management Planned: Oral ETT  Additional Equipment:   Intra-op Plan:   Post-operative Plan: Extubation in OR  Informed Consent: I have reviewed the patients History and Physical, chart, labs and discussed the procedure including the risks, benefits and alternatives for the proposed anesthesia with the patient or authorized representative who has indicated his/her understanding  and acceptance.   Dental advisory given  Plan Discussed with: CRNA  Anesthesia Plan Comments:         Anesthesia Quick Evaluation

## 2016-04-02 NOTE — Progress Notes (Signed)
04/02/2016 3:34 PM   DOB: 1976-02-16 / MRN: [id number]  SUBJECTIVE:  Brian Reid is a 40 y.o. male presenting for abdominal pain left upper and lower quadrant belly pain that started last night. He describes the pain as a "tightness."  Associates defecatory urgency but then is unable to go and this started 4 days ago. He also associates tactile fever, nausea, and emesis.  No other family members involved.   He has No Known Allergies.   He  has a past medical history of Hyperlipidemia.    He  reports that he has quit smoking. He has never used smokeless tobacco. He reports that he drinks about 1.2 oz of alcohol per week . He reports that he does not use drugs. He  reports that he does not engage in sexual activity. The patient  has a past surgical history that includes Open reduction shoulder dislocation and Leg Surgery.  His family history includes Diabetes in his father and mother; Heart disease in his father; Hypertension in his mother and sister; Stroke in his mother.  Review of Systems  Constitutional: Negative for fever.  Cardiovascular: Negative for chest pain.  Gastrointestinal: Positive for abdominal pain, constipation, nausea and vomiting. Negative for blood in stool, diarrhea, heartburn and melena.  Genitourinary: Negative for dysuria.  Musculoskeletal: Negative for myalgias.  Skin: Negative for rash.  Neurological: Negative for dizziness and headaches.    The problem list and medications were reviewed and updated by myself where necessary and exist elsewhere in the encounter.   OBJECTIVE:  BP 136/84 (BP Location: Right Arm, Patient Position: Sitting, Cuff Size: Normal)   Pulse 93   Temp (!) 100.4 F (38 C) (Oral)   Resp 17   Ht 5' 10.5" (1.791 m)   Wt 240 lb (108.9 kg)   SpO2 98%   BMI 33.95 kg/m   Physical Exam  Constitutional: He is oriented to person, place, and time.  Cardiovascular: Normal rate and regular rhythm.   Pulmonary/Chest: Effort normal and breath  sounds normal.  Abdominal: Soft. Bowel sounds are normal. He exhibits no distension and no mass. There is tenderness (right lower quadrant, left lower quadrant). There is no rebound and no guarding.  Musculoskeletal: Normal range of motion.  Neurological: He is alert and oriented to person, place, and time. He has normal reflexes. No cranial nerve deficit. Coordination normal.  Skin: Skin is warm and dry.  Psychiatric: He has a normal mood and affect.    Results for orders placed or performed in visit on 04/02/16 (from the past 72 hour(s))  POCT CBC     Status: Abnormal   Collection Time: 04/02/16  2:23 PM  Result Value Ref Range   WBC 16.5 (A) 4.6 - 10.2 K/uL   Lymph, poc 2.2 0.6 - 3.4   POC LYMPH PERCENT 13.1 10 - 50 %L   MID (cbc) 0.9 0 - 0.9   POC MID % 5.6 0 - 12 %M   POC Granulocyte 13.4 (A) 2 - 6.9   Granulocyte percent 81.3 (A) 37 - 80 %G   RBC 5.43 4.69 - 6.13 M/uL   Hemoglobin 14.6 14.1 - 18.1 g/dL   HCT, POC 42.2 (A) 43.5 - 53.7 %   MCV 77.8 (A) 80 - 97 fL   MCH, POC 26.9 (A) 27 - 31.2 pg   MCHC 34.6 31.8 - 35.4 g/dL   RDW, POC 13.3 %   Platelet Count, POC 166 142 - 424 K/uL   MPV 9.0 0 -  99.8 fL    Dg Abd 2 Views  Result Date: 04/02/2016 CLINICAL DATA:  Left upper and lower quadrant abdominal pain. Tightness in the abdomen. EXAM: ABDOMEN - 2 VIEW COMPARISON:  None. FINDINGS: The bowel gas pattern is normal. There is no evidence of free air. No radio-opaque calculi or other significant radiographic abnormality is seen. IMPRESSION: Negative abdominal radiographs. Electronically Signed   By: San Morelle M.D.   On: 04/02/2016 14:37    ASSESSMENT AND PLAN  Brian Reid was seen today for abdominal pain and fever.  Diagnoses and all orders for this visit:  Generalized abdominal pain: I am particularly concerned for an appedicitis.  Will CT his abdomen today.  If surgical will advise the ED.  Will treat for constipation if normal.  -     POCT CBC -     Lipase -      COMPLETE METABOLIC PANEL WITH GFR -     DG Abd 2 Views; Future -     CT Abdomen Pelvis W Contrast; Future  Granulocytosis -     CT Abdomen Pelvis W Contrast; Future  Leukocytosis -     CT Abdomen Pelvis W Contrast; Future    The patient is advised to call or return to clinic if he does not see an improvement in symptoms, or to seek the care of the closest emergency department if he worsens with the above plan.   Philis Fendt, MHS, PA-C [facility] 04/02/2016 3:34 PM

## 2016-04-02 NOTE — ED Triage Notes (Signed)
Pt here from MD office.  Pt started having abd pain yesterday with fever.  Pt had CT scan which showed acute appendicitis.  Pt sent here POV with 20 g LAC in place.

## 2016-04-02 NOTE — Patient Instructions (Addendum)
You are to go over to [facility] now for your scan.  Nothing to eat or drink.  [facility]  Address: [address], [place], [place] [address] [phone]    IF you received an x-ray today, you will receive an invoice from [facility]. Please contact [facility] at [phone] with questions or concerns regarding your invoice.   IF you received labwork today, you will receive an invoice from [employer]. Please contact [facility] at [phone] with questions or concerns regarding your invoice.   Our billing staff will not be able to assist you with questions regarding bills from these companies.  You will be contacted with the lab results as soon as they are available. The fastest way to get your results is to activate your My Chart account. Instructions are located on the last page of this paperwork. If you have not heard from [place] regarding the results in 2 weeks, please contact this office.

## 2016-04-02 NOTE — Anesthesia Procedure Notes (Addendum)
Procedure Name: Intubation Date/Time: 04/02/2016 10:39 PM Performed by: Lissa Morales Pre-anesthesia Checklist: Patient identified, Emergency Drugs available, Suction available and Patient being monitored Patient Re-evaluated:Patient Re-evaluated prior to inductionOxygen Delivery Method: Circle system utilized Preoxygenation: Pre-oxygenation with 100% oxygen Intubation Type: IV induction, Cricoid Pressure applied and Rapid sequence Ventilation: Mask ventilation without difficulty Laryngoscope Size: Mac and 4 Grade View: Grade II Tube type: Oral Tube size: 8.0 mm Number of attempts: 1 Airway Equipment and Method: Stylet and Oral airway Placement Confirmation: ETT inserted through vocal cords under direct vision,  positive ETCO2 and breath sounds checked- equal and bilateral Secured at: 22 cm Tube secured with: Tape Dental Injury: Teeth and Oropharynx as per pre-operative assessment

## 2016-04-02 NOTE — Progress Notes (Signed)
Per Philis Fendt, PA-C, pt was diagnosed with acute appendisitis. Pt was advised to go to [facility] ED from imaging with nurse escort.

## 2016-04-03 ENCOUNTER — Encounter: Payer: Self-pay | Admitting: General Surgery

## 2016-04-03 ENCOUNTER — Encounter (HOSPITAL_COMMUNITY): Payer: Self-pay | Admitting: General Surgery

## 2016-04-03 DIAGNOSIS — K3580 Unspecified acute appendicitis: Secondary | ICD-10-CM | POA: Diagnosis present

## 2016-04-03 MED ORDER — OXYCODONE-ACETAMINOPHEN 5-325 MG PO TABS: 1.0000 | ORAL_TABLET | ORAL | 0 refills | Status: DC | PRN

## 2016-04-03 MED ORDER — OXYCODONE-ACETAMINOPHEN 5-325 MG PO TABS
1.0000 | ORAL_TABLET | ORAL | Status: DC | PRN
  Administered 2016-04-03: 1 via ORAL
  Filled 2016-04-03: qty 1

## 2016-04-03 MED ORDER — HYDROMORPHONE HCL 1 MG/ML IJ SOLN
INTRAMUSCULAR | Status: AC
  Filled 2016-04-03: qty 1

## 2016-04-03 MED ORDER — KCL IN DEXTROSE-NACL 20-5-0.9 MEQ/L-%-% IV SOLN
INTRAVENOUS | Status: DC
  Administered 2016-04-03: 02:00:00 via INTRAVENOUS
  Filled 2016-04-03 (×2): qty 1000

## 2016-04-03 MED ORDER — IBUPROFEN 200 MG PO TABS: ORAL_TABLET | ORAL | Status: DC

## 2016-04-03 MED ORDER — HEPARIN SODIUM (PORCINE) 5000 UNIT/ML IJ SOLN
5000.0000 [IU] | Freq: Three times a day (TID) | INTRAMUSCULAR | Status: DC
Start: 2016-04-03 — End: 2016-04-03
  Administered 2016-04-03: 5000 [IU] via SUBCUTANEOUS
  Filled 2016-04-03: qty 1

## 2016-04-03 MED ORDER — LIDOCAINE 2% (20 MG/ML) 5 ML SYRINGE
INTRAMUSCULAR | Status: AC
  Filled 2016-04-03: qty 5

## 2016-04-03 MED ORDER — LABETALOL HCL 5 MG/ML IV SOLN
INTRAVENOUS | Status: AC
  Filled 2016-04-03: qty 4

## 2016-04-03 MED ORDER — ACETAMINOPHEN 10 MG/ML IV SOLN
1000.0000 mg | Freq: Once | INTRAVENOUS | Status: AC
  Administered 2016-04-03: 1000 mg via INTRAVENOUS

## 2016-04-03 MED ORDER — ONDANSETRON HCL 4 MG/2ML IJ SOLN: 4.0000 mg | Freq: Four times a day (QID) | INTRAMUSCULAR | Status: DC | PRN

## 2016-04-03 MED ORDER — ROCURONIUM BROMIDE 100 MG/10ML IV SOLN
INTRAVENOUS | Status: AC
  Filled 2016-04-03: qty 1

## 2016-04-03 MED ORDER — MORPHINE SULFATE (PF) 2 MG/ML IV SOLN
1.0000 mg | INTRAVENOUS | Status: DC | PRN
  Administered 2016-04-03: 2 mg via INTRAVENOUS
  Filled 2016-04-03: qty 1

## 2016-04-03 MED ORDER — METRONIDAZOLE IN NACL 5-0.79 MG/ML-% IV SOLN
500.0000 mg | Freq: Three times a day (TID) | INTRAVENOUS | Status: DC
  Administered 2016-04-03: 500 mg via INTRAVENOUS
  Filled 2016-04-03: qty 100

## 2016-04-03 MED ORDER — CEFTRIAXONE SODIUM 2 G IJ SOLR
2.0000 g | INTRAMUSCULAR | Status: DC
  Filled 2016-04-03: qty 2

## 2016-04-03 MED ORDER — ACETAMINOPHEN 10 MG/ML IV SOLN
INTRAVENOUS | Status: AC
  Filled 2016-04-03: qty 100

## 2016-04-03 MED ORDER — IBUPROFEN 200 MG PO TABS
600.0000 mg | ORAL_TABLET | Freq: Four times a day (QID) | ORAL | Status: DC | PRN
  Administered 2016-04-03: 600 mg via ORAL
  Filled 2016-04-03: qty 3

## 2016-04-03 MED ORDER — ONDANSETRON 4 MG PO TBDP: 4.0000 mg | ORAL_TABLET | Freq: Four times a day (QID) | ORAL | Status: DC | PRN

## 2016-04-03 MED ORDER — ACETAMINOPHEN 325 MG PO TABS: ORAL_TABLET | ORAL | Status: DC

## 2016-04-03 MED ORDER — FAMOTIDINE IN NACL 20-0.9 MG/50ML-% IV SOLN
20.0000 mg | Freq: Two times a day (BID) | INTRAVENOUS | Status: DC
  Administered 2016-04-03 (×2): 20 mg via INTRAVENOUS
  Filled 2016-04-03 (×3): qty 50

## 2016-04-03 NOTE — Transfer of Care (Signed)
Immediate Anesthesia Transfer of Care Note  Patient: Brian Reid  Procedure(s) Performed: Procedure(s): APPENDECTOMY LAPAROSCOPIC (N/A)  Patient Location: PACU  Anesthesia Type:General  Level of Consciousness: awake, alert , oriented, sedated and patient cooperative  Airway & Oxygen Therapy: Patient Spontanous Breathing and Patient connected to face mask oxygen  Post-op Assessment: Report given to RN, Post -op Vital signs reviewed and stable and Patient moving all extremities X 4  Post vital signs: stable  Last Vitals:  Vitals:   04/02/16 2356 04/03/16 0000  BP: (!) 155/90 (!) 150/93  Pulse: 95 96  Resp: (!) 29 (!) 26  Temp: (!) 38.6 C     Last Pain:  Vitals:   04/02/16 1947  TempSrc:   PainSc: 6          Complications: No apparent anesthesia complications

## 2016-04-03 NOTE — Discharge Instructions (Signed)
Laparoscopic Appendectomy, Adult, Care After Refer to this sheet in the next few weeks. These instructions provide you with information on caring for yourself after your procedure. Your caregiver may also give you more specific instructions. Your treatment has been planned according to current medical practices, but problems sometimes occur. Call your caregiver if you have any problems or questions after your procedure. HOME CARE INSTRUCTIONS  Do not drive while taking narcotic pain medicines.  Use stool softener if you become constipated from your pain medicines.  Change your bandages (dressings) as directed.  Keep your wounds clean and dry. You may wash the wounds gently with soap and water. Gently pat the wounds dry with a clean towel.  Do not take baths, swim, or use hot tubs for 10 days, or as instructed by your caregiver.  Only take over-the-counter or prescription medicines for pain, discomfort, or fever as directed by your caregiver.  You may continue your normal diet as directed.  Do not lift more than 10 pounds (4.5 kg) or play contact sports for 3 weeks, or as directed.  Slowly increase your activity after surgery.  Take deep breaths to avoid getting a lung infection (pneumonia). SEEK MEDICAL CARE IF:  You have redness, swelling, or increasing pain in your wounds.  You have pus coming from your wounds.  You have drainage from a wound that lasts longer than 1 day.  You notice a bad smell coming from the wounds or dressing.  Your wound edges break open after stitches (sutures) have been removed.  You notice increasing pain in the shoulders (shoulder strap areas) or near your shoulder blades.  You develop dizzy episodes or fainting while standing.  You develop shortness of breath.  You develop persistent nausea or vomiting.  You cannot control your bowel functions or lose your appetite.  You develop diarrhea. SEEK IMMEDIATE MEDICAL CARE IF:   You have a  fever.  You develop a rash.  You have difficulty breathing or sharp pains in your chest.  You develop any reaction or side effects to medicines given. MAKE SURE YOU:  Understand these instructions.  Will watch your condition.  Will get help right away if you are not doing well or get worse.   This information is not intended to replace advice given to you by your health care provider. Make sure you discuss any questions you have with your health care provider.   Document Released: 07/21/2005 Document Revised: 12/05/2014 Document Reviewed: 01/08/2015 Elsevier Interactive Patient Education 2016 [place] ______[employer] LAPAROSCOPIC SURGERY: POST OP INSTRUCTIONS Always review your discharge instruction sheet given to you by the facility where your surgery was performed. IF YOU HAVE DISABILITY OR FAMILY LEAVE FORMS, YOU MUST BRING THEM TO THE OFFICE FOR PROCESSING.   DO NOT GIVE THEM TO YOUR DOCTOR.  1. A prescription for pain medication may be given to you upon discharge.  Take your pain medication as prescribed, if needed.  If narcotic pain medicine is not needed, then you may take acetaminophen (Tylenol) or ibuprofen (Advil) as needed. 2. Take your usually prescribed medications unless otherwise directed. 3. If you need a refill on your pain medication, please contact your pharmacy.  They will contact our office to request authorization. Prescriptions will not be filled after 5pm or on week-ends. 4. You should follow a light diet the first few days after arrival home, such as soup and crackers, etc.  Be sure to include lots of fluids daily. 5. Most  patients will experience some swelling and bruising in the area of the incisions.  Ice packs will help.  Swelling and bruising can take several days to resolve.  6. It is common to experience some constipation if taking pain medication after surgery.  Increasing fluid intake and taking a stool softener (such as  Colace) will usually help or prevent this problem from occurring.  A mild laxative (Milk of Magnesia or Miralax) should be taken according to package instructions if there are no bowel movements after 48 hours. 7. Unless discharge instructions indicate otherwise, you may remove your bandages 24-48 hours after surgery, and you may shower at that time.  You may have steri-strips (small skin tapes) in place directly over the incision.  These strips should be left on the skin for 7-10 days.  If your surgeon used skin glue on the incision, you may shower in 24 hours.  The glue will flake off over the next 2-3 weeks.  Any sutures or staples will be removed at the office during your follow-up visit. 8. ACTIVITIES:  You may resume regular (light) daily activities beginning the next day--such as daily self-care, walking, climbing stairs--gradually increasing activities as tolerated.  You may have sexual intercourse when it is comfortable.  Refrain from any heavy lifting or straining until approved by your doctor. a. You may drive when you are no longer taking prescription pain medication, you can comfortably wear a seatbelt, and you can safely maneuver your car and apply brakes. b. RETURN TO WORK:  __________________________________________________________ 9. You should see your doctor in the office for a follow-up appointment approximately 2-3 weeks after your surgery.  Make sure that you call for this appointment within a day or two after you arrive home to insure a convenient appointment time. 10. OTHER INSTRUCTIONS: __________________________________________________________________________________________________________________________ __________________________________________________________________________________________________________________________ WHEN TO CALL YOUR DOCTOR: 1. Fever over 101.0 2. Inability to urinate 3. Continued bleeding from incision. 4. Increased pain, redness, or drainage from the  incision. 5. Increasing abdominal pain  The clinic staff is available to answer your questions during regular business hours.  Please dont hesitate to call and ask to speak to one of the nurses for clinical concerns.  If you have a medical emergency, go to the nearest emergency room or call 911.  A surgeon from [facility] is always on call at the hospital. [address] [place], [place], [place]  [address] ? [address], [place], [place]   [address] [phone] ? [phone] ? FAX [phone] Web site: [email or web address]

## 2016-04-03 NOTE — Progress Notes (Signed)
1 Day Post-Op  Subjective: He looks good and is hungry.  I plan to advance diet, get him up in the halls, and if OK let him go home after lunch.  Objective: Vital signs in last 24 hours: Temp:  [98.1 F (36.7 C)-101.5 F (38.6 C)] 98.1 F (36.7 C) (08/31 0551) Pulse Rate:  [65-109] 65 (08/31 0551) Resp:  [16-31] 16 (08/31 0551) BP: (100-155)/(59-93) 100/63 (08/31 0551) SpO2:  [97 %-100 %] 99 % (08/31 0551) Weight:  [108.9 kg (240 lb)] 108.9 kg (240 lb) (08/30 1923)   Urine 925 IV fluids 1910 TM 101.5 at 11 PM post op?, down this AM, VSS NO labs/films this AM     Intake/Output from previous day: 08/30 0701 - 08/31 0700 In: 1910 [I.V.:1810; IV Piggyback:100] Out: 950 [Urine:925; Blood:25] Intake/Output this shift: No intake/output data recorded.  General appearance: alert, cooperative and no distress Resp: clear to auscultation bilaterally GI: soft, sore, sites look very good.  Lab Results:   Recent Labs  04/02/16 1423 04/02/16 2025  WBC 16.5*  --   HGB 14.6 16.0  HCT 42.2* 47.0    BMET  Recent Labs  04/02/16 1410 04/02/16 2025  NA 137 138  K 4.2 3.7  CL 99 99*  CO2 26  --   GLUCOSE 135* 120*  BUN 7 7  CREATININE 0.97 1.00  CALCIUM 9.1  --    PT/INR No results for input(s): LABPROT, INR in the last 72 hours.   Recent Labs Lab 04/02/16 1410  AST 18  ALT 32  ALKPHOS 53  BILITOT 1.3*  PROT 6.8  ALBUMIN 4.3     Lipase     Component Value Date/Time   LIPASE 15 04/02/2016 1410     Studies/Results: Ct Abdomen Pelvis W Contrast  Result Date: 04/02/2016 CLINICAL DATA:  Abdominal pain. EXAM: CT ABDOMEN AND PELVIS WITH CONTRAST TECHNIQUE: Multidetector CT imaging of the abdomen and pelvis was performed using the standard protocol following bolus administration of intravenous contrast. CONTRAST:  175m ISOVUE-300 IOPAMIDOL (ISOVUE-300) INJECTION 61% COMPARISON:  None. FINDINGS: Lower chest:  No acute findings. Hepatobiliary: No masses or other  significant abnormality. Pancreas: No mass, inflammatory changes, or other significant abnormality. Spleen: Within normal limits in size and appearance. Adrenals/Urinary Tract: Normal adrenalNo masses identified. No evidence of hydronephrosis. Stomach/Bowel: No bowel dilatation or bowel wall thickening. Dilated appendix with mucosal enhancement measuring 15 mm in diameter. Severe surrounding periappendiceal inflammatory changes. No focal fluid collection. No pneumatosis, pneumoperitoneum or portal venous gas. Vascular/Lymphatic: No pathologically enlarged lymph nodes. No evidence of abdominal aortic aneurysm. Reproductive: No mass or other significant abnormality. Other: None. Musculoskeletal:  No suspicious bone lesions identified. IMPRESSION: 1. Findings consistent with acute appendicitis. No focal fluid collection to suggest an abscess. Electronically Signed   By: HKathreen Devoid  On: 04/02/2016 18:36   Dg Abd 2 Views  Result Date: 04/02/2016 CLINICAL DATA:  Left upper and lower quadrant abdominal pain. Tightness in the abdomen. EXAM: ABDOMEN - 2 VIEW COMPARISON:  None. FINDINGS: The bowel gas pattern is normal. There is no evidence of free air. No radio-opaque calculi or other significant radiographic abnormality is seen. IMPRESSION: Negative abdominal radiographs. Electronically Signed   By: CSan MorelleM.D.   On: 04/02/2016 14:37   Prior to Admission medications   Medication Sig Start Date End Date Taking? Authorizing Provider  Phenylephrine-DM-GG-APAP (TYLENOL COLD MULTI-SYMPTOM) 5-10-200-325 MG TABS Take 2 tablets by mouth daily as needed (cold symptoms).   Yes Historical Provider,  MD    Medications: . acetaminophen      . cefTRIAXone (ROCEPHIN)  IV  2 g Intravenous Q24H   And  . metronidazole  500 mg Intravenous Q8H  . famotidine (PEPCID) IV  20 mg Intravenous Q12H  . heparin  5,000 Units Subcutaneous Q8H  . HYDROmorphone       . dextrose 5 % and 0.9 % NaCl with KCl 20 mEq/L 100  mL/hr at 04/03/16 5643   Assessment/Plan Acute appendicitis S/p laparoscopic appendectomy, 04/02/16, Dr. Fredrik Cove FEN:  IV fluids/clear diet ID: flagyl/Metronidazole day 2 DVT:  heparin    Plan:  Advance diet and mobilize.  If he does well home after lunch.   LOS: 0 days    Brian Reid 04/03/2016 [phone]

## 2016-04-03 NOTE — Progress Notes (Signed)
Assessment unchanged.  Scripts were given per MD order.  All questions pertaining to D/C we answered.  Pt was D/C'd via ambulation and accompanied by NT.  Note for work given.  Roselind Rily

## 2016-04-08 NOTE — Anesthesia Postprocedure Evaluation (Signed)
Anesthesia Post Note  Patient: [name]  Procedure(s) Performed: Procedure(s) (LRB): APPENDECTOMY LAPAROSCOPIC (N/A)  Patient location during evaluation: PACU Anesthesia Type: General Level of consciousness: awake and alert Pain management: pain level controlled Vital Signs Assessment: post-procedure vital signs reviewed and stable Respiratory status: spontaneous breathing, nonlabored ventilation, respiratory function stable and patient connected to nasal cannula oxygen Cardiovascular status: blood pressure returned to baseline and stable Postop Assessment: no signs of nausea or vomiting Anesthetic complications: no    Last Vitals:  Vitals:   04/03/16 0941 04/03/16 1520  BP: (!) 112/59 105/62  Pulse: 81 76  Resp: 16 16  Temp: 36.6 C 36.8 C    Last Pain:  Vitals:   04/03/16 1520  TempSrc: Oral  PainSc:                  Effie Berkshire

## 2016-04-09 NOTE — Discharge Summary (Signed)
Physician Discharge Summary  Patient ID: Everest Brod MRN: [id number] DOB/AGE: 1976-07-13 40 y.o.  Admit date: 04/02/2016 Discharge date: 04/03/2016  Admission Diagnoses: acute appendicitis   Discharge Diagnoses:  Same   Principal Problem:   Acute appendicitis s/p lap appendectomy 04/02/2016   PROCEDURES: S/p laparoscopic appendectomy, 04/02/16, Dr. Tobe Sos Course: The patient is a 40 year old male who presents with abdominal pain that started yesterday afternoon. The pain was located across his lower abdomen. The pain worsened during the night and is now localized to the right lower quadrant. He has had some low-grade fevers over the last 24 hours. He is also had some nausea and vomiting. In the emergency department and a CT scan was obtained that shows acute appendicitis but no evidence of perforation.  He was taken to the OR that evening by Dr. Marlou Starks.  He did well post op and was ready for discharge the following day after lunch.  CBC Latest Ref Rng & Units 04/02/2016 04/02/2016 02/26/2016  WBC 4.6 - 10.2 K/uL 16.5(A) - 8.3  Hemoglobin 13.0 - 17.0 g/dL 16.0 14.6 14.8  Hematocrit 39.0 - 52.0 % 47.0 42.2(A) 44.0  Platelets 140 - 400 K/uL - - 200   CMP Latest Ref Rng & Units 04/02/2016 04/02/2016 02/26/2016  Glucose 65 - 99 mg/dL 120(H) 135(H) 103(H)  BUN 6 - 20 mg/dL 7 7 7   Creatinine 0.61 - 1.24 mg/dL 1.00 0.97 1.07  Sodium 135 - 145 mmol/L 138 137 141  Potassium 3.5 - 5.1 mmol/L 3.7 4.2 4.5  Chloride 101 - 111 mmol/L 99(L) 99 103  CO2 20 - 31 mmol/L - 26 28  Calcium 8.6 - 10.3 mg/dL - 9.1 9.4  Total Protein 6.1 - 8.1 g/dL - 6.8 -  Total Bilirubin 0.2 - 1.2 mg/dL - 1.3(H) -  Alkaline Phos 40 - 115 U/L - 53 -  AST 10 - 40 U/L - 18 -  ALT 9 - 46 U/L - 32 -    Condition on d/c:  Improved    Disposition: 01-Home or Self Care     Medication List    TAKE these medications   acetaminophen 325 MG tablet Commonly known as:  TYLENOL You can take 2 every 4 hours for pain as  needed.  Use this or Ibuprofen as first line pain medicines.  Use Prescription medicine as second line.  Your prescription medicine also has this medicine in it.  You cannot take more than 4000 mg of Tylenol(acetaminophen) per day.   ibuprofen 200 MG tablet Commonly known as:  ADVIL,MOTRIN You can take 2-3 tablets every 6 hours as needed for pain. Use this or Tylenol, (acetaminophen) as first line pain medication.  Use prescription as second line.   oxyCODONE-acetaminophen 5-325 MG tablet Commonly known as:  PERCOCET/ROXICET Take 1-2 tablets by mouth every 4 (four) hours as needed for moderate pain.   TYLENOL COLD MULTI-SYMPTOM 5-10-200-325 MG Tabs Generic drug:  Phenylephrine-DM-GG-APAP Take 2 tablets by mouth daily as needed (cold symptoms).      Follow-up Information    [facility] Follow up on 04/23/2016.   Specialty:  General Surgery Why:  Your appointment is at 08:30 AM be at the office 30 minutes early for check in.- Contact information: [place] [address] [phone]           Signed: Earnstine Regal 04/09/2016, 2:12 PM

## 2016-05-14 ENCOUNTER — Telehealth: Payer: Self-pay | Admitting: Neurology

## 2016-05-14 DIAGNOSIS — R0683 Snoring: Secondary | ICD-10-CM

## 2016-05-14 DIAGNOSIS — R51 Headache: Secondary | ICD-10-CM

## 2016-05-14 DIAGNOSIS — R519 Headache, unspecified: Secondary | ICD-10-CM

## 2016-05-14 DIAGNOSIS — G4710 Hypersomnia, unspecified: Secondary | ICD-10-CM

## 2016-05-14 DIAGNOSIS — G4486 Cervicogenic headache: Secondary | ICD-10-CM

## 2016-05-14 NOTE — Telephone Encounter (Signed)
Please order hst

## 2016-05-14 NOTE — Telephone Encounter (Signed)
[employer] denied Split sleep study however did approve HST.  Can I get an order for HST

## 2016-05-15 NOTE — Addendum Note (Signed)
Addended by: Laurence Spates on: 05/15/2016 07:48 AM   Modules accepted: Orders

## 2016-05-15 NOTE — Telephone Encounter (Signed)
Order has been placed.

## 2016-06-06 ENCOUNTER — Encounter: Payer: Self-pay | Admitting: Diagnostic Neuroimaging

## 2016-06-06 ENCOUNTER — Ambulatory Visit (INDEPENDENT_AMBULATORY_CARE_PROVIDER_SITE_OTHER): Payer: Managed Care, Other (non HMO) | Admitting: Diagnostic Neuroimaging

## 2016-06-06 VITALS — BP 130/86 | HR 57 | Wt 240.6 lb

## 2016-06-06 DIAGNOSIS — G4484 Primary exertional headache: Secondary | ICD-10-CM | POA: Diagnosis not present

## 2016-06-06 DIAGNOSIS — M542 Cervicalgia: Secondary | ICD-10-CM | POA: Diagnosis not present

## 2016-06-06 NOTE — Progress Notes (Signed)
[facility]  PATIENT: Brian Reid DOB: 08-23-1975  REFERRING CLINICIAN: Philis Fendt, PA-c HISTORY FROM: patient  REASON FOR VISIT: follow up   HISTORICAL  CHIEF COMPLAINT:  Chief Complaint  Patient presents with  . Headache    rm 6, "no headaches in past 2 months if I sleep on the floor, I do not snore if I sleep on the floor; did not doo sleep study but will ocnsider in future"  . Follow-up    3 month    HISTORY OF PRESENT ILLNESS:   UPDATE 06/06/16: Since last visit patient's headaches have resolved. Patient has found that sleeping on the floor or other firm surface decreases his snoring and headaches. When he sleeps on his usual mattress his headaches and snoring return. Patient having intermittent left posterior neck pain when he turns his head to the right. Patient has been more physically active lately and trying to improve his general health.  PRIOR HPI (03/05/16): 40 year old male with new onset headaches starting 3 weeks ago. Patient reports intermittent episodes of pain radiating from neck, occipital region and chin to the top of his head. At aches last 10-20 minutes a time. He said 4-5 events, 3 provoked with exertion and 2 unprovoked. No vision changes, nausea, vomiting, sensitivity to light or sound. One event occurred with sexual activity. 2 other events occurred with exercise activity. 6-7 months ago patient started having increasing neck pain. For past 3 months patient has been doing increased driving commute, over one hour each way to [facility]. Patient has trying some mild exercise up to 20 minutes at a time and has been able to tolerate this without triggering headaches.  Patient had a different type of headache in 2015 which was self-limited. No other recent traumas, accidents, infections, change in sleep or stress. Patient does have history of snoring and intermittent daytime fatigue.   REVIEW OF SYSTEMS: Full 14 system review of  systems performed and negative with exception of: Only as per history of present illness.  ALLERGIES: No Known Allergies  HOME MEDICATIONS: Outpatient Medications Prior to Visit  Medication Sig Dispense Refill  . acetaminophen (TYLENOL) 325 MG tablet You can take 2 every 4 hours for pain as needed.  Use this or Ibuprofen as first line pain medicines.  Use Prescription medicine as second line.  Your prescription medicine also has this medicine in it.  You cannot take more than 4000 mg of Tylenol(acetaminophen) per day.    . ibuprofen (ADVIL,MOTRIN) 200 MG tablet You can take 2-3 tablets every 6 hours as needed for pain. Use this or Tylenol, (acetaminophen) as first line pain medication.  Use prescription as second line.    Marland Kitchen oxyCODONE-acetaminophen (PERCOCET/ROXICET) 5-325 MG tablet Take 1-2 tablets by mouth every 4 (four) hours as needed for moderate pain. 40 tablet 0  . Phenylephrine-DM-GG-APAP (TYLENOL COLD MULTI-SYMPTOM) 5-10-200-325 MG TABS Take 2 tablets by mouth daily as needed (cold symptoms).     No facility-administered medications prior to visit.     PAST MEDICAL HISTORY: Past Medical History:  Diagnosis Date  . Hyperlipidemia     PAST SURGICAL HISTORY: Past Surgical History:  Procedure Laterality Date  . LAPAROSCOPIC APPENDECTOMY N/A 04/02/2016   Procedure: APPENDECTOMY LAPAROSCOPIC;  Surgeon: Autumn Messing III, MD;  Location: [facility];  Service: General;  Laterality: N/A;  . LEG SURGERY     age 82  . OPEN REDUCTION SHOULDER DISLOCATION     age 5    FAMILY HISTORY: Family History  Problem Relation Age of Onset  . Diabetes Mother   . Hypertension Mother   . Stroke Mother   . Diabetes Father   . Heart disease Father   . Hypertension Sister     SOCIAL HISTORY:  Social History   Social History  . Marital status: Married    Spouse name: N/A  . Number of children: 2  . Years of education: post grad   Occupational History  .      IT professional   Social  History Main Topics  . Smoking status: Former Research scientist (life sciences)  . Smokeless tobacco: Never Used     Comment: 03/05/16 "very occasional now", 06/06/16 not smoking  . Alcohol use 1.2 oz/week    2 Standard drinks or equivalent per week     Comment: social  . Drug use: No  . Sexual activity: No   Other Topics Concern  . Not on file   Social History Narrative   Lives with family   Drinks 1 cup of coffee a day      PHYSICAL EXAM  GENERAL EXAM/CONSTITUTIONAL: Vitals:  Vitals:   06/06/16 1138 06/06/16 1139  BP: 136/90 130/86  Pulse: (!) 59 (!) 57  Weight: 240 lb 9.6 oz (109.1 kg)    Wt Readings from Last 3 Encounters:  06/06/16 240 lb 9.6 oz (109.1 kg)  04/02/16 240 lb (108.9 kg)  04/02/16 240 lb (108.9 kg)    Body mass index is 34.52 kg/m. No exam data present  Patient is in no distress; well developed, nourished and groomed; neck is supple  CARDIOVASCULAR:  Examination of carotid arteries is normal; no carotid bruits  Regular rate and rhythm, no murmurs  Examination of peripheral vascular system by observation and palpation is normal  EYES:  Ophthalmoscopic exam of optic discs and posterior segments is normal; no papilledema or hemorrhages  MUSCULOSKELETAL:  Gait, strength, tone, movements noted in Neurologic exam below  NEUROLOGIC: MENTAL STATUS:  No flowsheet data found.  awake, alert, oriented to person, place and time  recent and remote memory intact  normal attention and concentration  language fluent, comprehension intact, naming intact,   fund of knowledge appropriate  CRANIAL NERVE:   2nd - no papilledema on fundoscopic exam  2nd, 3rd, 4th, 6th - pupils equal and reactive to light, visual fields full to confrontation, extraocular muscles intact, no nystagmus  5th - facial sensation symmetric  7th - facial strength symmetric  8th - hearing intact  9th - palate elevates symmetrically, uvula midline  11th - shoulder shrug symmetric  12th -  tongue protrusion midline  MOTOR:   normal bulk and tone, full strength in the BUE, BLE  SENSORY:   normal and symmetric to light touch,  COORDINATION:   finger-nose-finger, fine finger movements normal  REFLEXES:   deep tendon reflexes present and symmetric  GAIT/STATION:   narrow based gait    DIAGNOSTIC DATA (LABS, IMAGING, TESTING) - I reviewed patient records, labs, notes, testing and imaging myself where available.  Lab Results  Component Value Date   WBC 16.5 (A) 04/02/2016   HGB 16.0 04/02/2016   HCT 47.0 04/02/2016   MCV 77.8 (A) 04/02/2016   PLT 200 02/26/2016      Component Value Date/Time   NA 138 04/02/2016 2025   K 3.7 04/02/2016 2025   CL 99 (L) 04/02/2016 2025   CO2 26 04/02/2016 1410   GLUCOSE 120 (H) 04/02/2016 2025   BUN 7 04/02/2016 2025   CREATININE 1.00 04/02/2016  2025   CREATININE 0.97 04/02/2016 1410   CALCIUM 9.1 04/02/2016 1410   PROT 6.8 04/02/2016 1410   ALBUMIN 4.3 04/02/2016 1410   AST 18 04/02/2016 1410   ALT 32 04/02/2016 1410   ALKPHOS 53 04/02/2016 1410   BILITOT 1.3 (H) 04/02/2016 1410   GFRNONAA >89 04/02/2016 1410   GFRAA >89 04/02/2016 1410   Lab Results  Component Value Date   CHOL 199 02/26/2016   HDL 34 (L) 02/26/2016   LDLCALC 125 02/26/2016   TRIG 200 (H) 02/26/2016   CHOLHDL 5.9 (H) 02/26/2016   Lab Results  Component Value Date   HGBA1C 6.0 (H) 02/26/2016   No results found for: [id number] Lab Results  Component Value Date   TSH 2.10 02/26/2016    02/26/16 CTA head [I reviewed images myself and agree with interpretation. -VRP]  - Negative head CTA.  No evidence of aneurysm or mass.    ASSESSMENT AND PLAN  40 y.o. year old male here with new onset headaches in August 2017, with several events occurring with exercise or sexual activity exertion. Neurologic examination otherwise unremarkable. Patient now able to tolerate physical activity without triggering headaches. Reassured patient and  encouraged to gradually increase activity.    Ddx: primary exertional headaches vs sleep apnea vs cervicogenic headache  1. Exertional headache   2. Neck pain      PLAN: - gradually increase activity - consider sleep study to rule out obstructive sleep apnea - consider MRI brain If headaches or symptoms continue - consider MRI cervical spine if neck pain increases  Return if symptoms worsen or fail to improve, for return to PCP.    Penni Bombard, MD 19/08/4780, 95:62 PM Certified in Neurology, Neurophysiology and Neuroimaging  [facility] [address] [place], [place] [address] [phone]

## 2019-02-15 ENCOUNTER — Other Ambulatory Visit: Payer: Self-pay | Admitting: Pain Medicine

## 2019-02-15 DIAGNOSIS — M25519 Pain in unspecified shoulder: Secondary | ICD-10-CM

## 2019-02-15 DIAGNOSIS — M542 Cervicalgia: Secondary | ICD-10-CM

## 2019-03-12 ENCOUNTER — Other Ambulatory Visit: Payer: Self-pay

## 2019-03-12 ENCOUNTER — Ambulatory Visit
Admission: RE | Admit: 2019-03-12 | Discharge: 2019-03-12 | Disposition: A | Discharge status: Home / Self Care | Payer: Managed Care, Other (non HMO) | Source: Ambulatory Visit | Attending: Pain Medicine | Admitting: Pain Medicine

## 2019-03-12 DIAGNOSIS — M542 Cervicalgia: Secondary | ICD-10-CM

## 2019-03-12 DIAGNOSIS — M25519 Pain in unspecified shoulder: Secondary | ICD-10-CM

## 2020-04-06 ENCOUNTER — Ambulatory Visit: Payer: Managed Care, Other (non HMO) | Admitting: Family Medicine

## 2020-06-27 ENCOUNTER — Other Ambulatory Visit: Payer: Self-pay

## 2020-06-27 ENCOUNTER — Ambulatory Visit (INDEPENDENT_AMBULATORY_CARE_PROVIDER_SITE_OTHER): Payer: Managed Care, Other (non HMO)

## 2020-06-27 ENCOUNTER — Ambulatory Visit: Payer: Managed Care, Other (non HMO) | Admitting: Family Medicine

## 2020-06-27 VITALS — BP 136/86 | HR 71 | Temp 98.2°F | Ht 70.0 in | Wt 237.0 lb

## 2020-06-27 DIAGNOSIS — M542 Cervicalgia: Secondary | ICD-10-CM

## 2020-06-27 DIAGNOSIS — M25511 Pain in right shoulder: Secondary | ICD-10-CM

## 2020-06-27 DIAGNOSIS — M545 Low back pain, unspecified: Secondary | ICD-10-CM

## 2020-06-27 DIAGNOSIS — M7712 Lateral epicondylitis, left elbow: Secondary | ICD-10-CM

## 2020-06-27 MED ORDER — MELOXICAM 7.5 MG PO TABS: 7.5000 mg | ORAL_TABLET | Freq: Every day | ORAL | 0 refills | Status: DC

## 2020-06-27 NOTE — Progress Notes (Signed)
Subjective:  Patient ID: Brian Reid, male    DOB: January 16, 1976  Age: 44 y.o. MRN: [id number]  CC:  Chief Complaint  Patient presents with   Establish Care    Pt reports he feels ok, but having some pain in his neck. pt states 80% of the time the pt wakes up with pain in his neck. pt is unsure if he has had any trama to his neck in the past. pt reports that he may have hit it getting into his car a few years ago.    HPI Brian Reid presents for   New pt with concerns above   Neck pain: Left side, past few years. No known injury. May have been getting into car in 2019 and may have bumped left head, turning neck to right, but no initial pain. Unknown timing of soreness after. Was driving 1 hour to work at that time (commute to [employer] no longer commuting).  initially intermittent, now persistent pain in left neck.pain in left shoulder and elbow after workout. Feels slight weakness only when shoulder and elbow in pain, otherwise has strength.  Saw chiropracter in 2019, then in 2020 - no changes.  Had some xrays - unknown results. No injections, no operations.  Seen by Dr. Vira Blanco in 03/2019. Had MRI of cpsine 03/12/19:  IMPRESSION: 1. 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 2. Right eccentric disc bulging with uncovertebral hypertrophy at C4-5 and C5-6 with resultant mild canal, with moderate right C5 and C6 foraminal stenosis. 3. Mild right C4 foraminal narrowing related to disc bulge and uncovertebral hypertrophy. 4. Small central disc protrusions at C2-3 and C7-T1 without stenosis or impingement. Option of injection by Dr. Vira Blanco possibly, but not sure and did not want to get injection.  Has seen chiropracter again since that visit -  No change after 20 visits.   Low back pain at times. Past month. No fall/injury. Sore after sitting for awhile.  No bowel or bladder incontinence, no saddle anesthesia, no  lower extremity weakness.  Pain in R buttocks at times past month.   Tx: none  Hyperglycemia  Not fasting today, ate 3 hrs ago.  Had prediabetes prior - pt of Dr. Juanell Fairly  No hx of kidney disease.   Had R shoulder surgery in 2004. Click in area 6 months ago. Some pain in area 6 months ago. Sore with workouts only.          History Patient Active Problem List   Diagnosis Date Noted   Acute appendicitis s/p lap appendectomy 04/02/2016 04/03/2016   History reviewed. No pertinent past medical history. Past Surgical History:  Procedure Laterality Date   APPENDECTOMY N/A    [employer] 06/27/2020   LAPAROSCOPIC APPENDECTOMY N/A 04/02/2016   Procedure: APPENDECTOMY LAPAROSCOPIC;  Surgeon: Autumn Messing III, MD;  Location: [facility];  Service: General;  Laterality: N/A;   LEG SURGERY     age 64   OPEN REDUCTION SHOULDER DISLOCATION     age 33   No Known Allergies Prior to Admission medications   Medication Sig Start Date End Date Taking? Authorizing Provider  Omega-3 Fatty Acids (FISH OIL) 1000 MG CAPS Take by mouth daily. Patient not taking: Reported on 06/27/2020    [provider]   Social History   Socioeconomic History   Marital status: Married    Spouse name: Not on file   Number of children: 2   Years of education: post grad  Highest education level: Not on file  Occupational History    Comment: IT professional  Tobacco Use   Smoking status: Former Smoker   Smokeless tobacco: Never Used   Tobacco comment: 03/05/16 "very occasional now", 06/06/16 not smoking  Vaping Use   Vaping Use: Never used  Substance and Sexual Activity   Alcohol use: Yes    Alcohol/week: 2.0 standard drinks    Types: 2 Standard drinks or equivalent per week    Comment: social   Drug use: No   Sexual activity: Yes  Other Topics Concern   Not on file  Social History Narrative   Lives with family   Drinks 1 cup of coffee a day    Social Determinants of Health   Financial  Resource Strain:    Difficulty of Paying Living Expenses: Not on file  Food Insecurity:    Worried About Charity fundraiser in the Last Year: Not on file   [employer] of Food in the Last Year: Not on file  Transportation Needs:    Lack of Transportation (Medical): Not on file   Lack of Transportation (Non-Medical): Not on file  Physical Activity:    Days of Exercise per Week: Not on file   Minutes of Exercise per Session: Not on file  Stress:    Feeling of Stress : Not on file  Social Connections:    Frequency of Communication with Friends and Family: Not on file   Frequency of Social Gatherings with Friends and Family: Not on file   Attends Religious Services: Not on file   Active Member of Clubs or Organizations: Not on file   Attends Archivist Meetings: Not on file   Marital Status: Not on file  Intimate Partner Violence:    Fear of Current or Ex-Partner: Not on file   Emotionally Abused: Not on file   Physically Abused: Not on file   Sexually Abused: Not on file    Review of Systems   Objective:   Vitals:   06/27/20 1514 06/27/20 1523  BP: (!) 142/89 136/86  Pulse: 71   Temp: 98.2 F (36.8 C)   TempSrc: Temporal   SpO2: 99%   Weight: 237 lb (107.5 kg)   Height: 5' 10"  (1.778 m)      Physical Exam Constitutional:      General: He is not in acute distress.    Appearance: He is well-developed.  HENT:     Head: Normocephalic and atraumatic.  Cardiovascular:     Rate and Rhythm: Normal rate.  Pulmonary:     Effort: Pulmonary effort is normal.  Musculoskeletal:     Comments: C-spine.  No midline bony tenderness, area of discomfort located near the left paraspinals.  No appreciable spasm.  Decreased rotation left greater than right, slight decreased extension and lateral flexion bilaterally. Upper extremity reflexes, regular radialis 1+, some difficulty with biceps, triceps reflex bilaterally.  Left shoulder: No AC/Bressler tenderness, full  range of motion, full rotator cuff strength.  No focal bony tenderness appreciated.  Left elbow full range of motion, tender to palpation over lateral epicondyle.  Upper extremity strength equal bilaterally, grip strength equal bilaterally.  Lumbar spine decreased flexion to approximately 70 to 80 degrees, no midline bony tenderness.  Negative seated straight leg raise.  Lower extremity reflexes, Achilles 1+ bilaterally, difficulty with patella reflex bilaterally.  Ambulates without difficulty.  Neurological:     Mental Status: He is alert and oriented to person, place, and time.  43 minutes spent during visit, greater than 50% counseling and assimilation of information, chart review, and discussion of plan.   DG Cervical Spine Complete  Result Date: 06/27/2020 CLINICAL DATA:  Neck pain EXAM: CERVICAL SPINE - COMPLETE 4+ VIEW COMPARISON:  MRI 03/12/2019, 01/04/2019 FINDINGS: Straightening of the cervical spine, suboptimal visualization of cervicothoracic junction. Vertebral body heights are maintained. Mild degenerative changes C5-C6 and C6-C7. Possible mild right foraminal narrowing at C3-C4 and C4-C5. The dens and lateral masses are within normal limits. IMPRESSION: Straightening of the cervical spine with mild degenerative changes as described above. Electronically Signed   By: Donavan Foil M.D.   On: 06/27/2020 17:07   DG Lumbar Spine Complete  Result Date: 06/27/2020 CLINICAL DATA:  Back pain EXAM: LUMBAR SPINE - COMPLETE 4+ VIEW COMPARISON:  Report 01/07/2019 FINDINGS: Alignment is normal. Vertebral body heights are maintained. Disc spaces appear within normal limits. Mild osteophyte at L3-L4. IMPRESSION: Negative. Electronically Signed   By: Donavan Foil M.D.   On: 06/27/2020 17:08   DG Shoulder Right  Result Date: 06/27/2020 CLINICAL DATA:  Shoulder pain EXAM: RIGHT SHOULDER - 2+ VIEW COMPARISON:  None. FINDINGS: AC joint is intact. No fracture or malalignment. Fractured screw  within the glenoid. IMPRESSION: No acute osseous abnormality. Fractured screw within the glenoid of the scapula. Electronically Signed   By: Donavan Foil M.D.   On: 06/27/2020 17:09     Assessment & Plan:  Gregrey Bloyd is a 44 y.o. male . Pain in joint of right shoulder - Plan: DG Shoulder Right  -Prior surgery with fractured screw noted on imaging. Refer to orthopedics. Range of motion and strength reassuring on exam at this time.  Neck pain - Plan: DG Cervical Spine Complete, meloxicam (MOBIC) 7.5 MG tablet, Ambulatory referral to Spine Surgery  -Degenerative changes as above. Longstanding symptoms, refer to orthopedic spine specialist. Short-term meloxicam provided if needed.  Bilateral low back pain without sciatica, unspecified chronicity - Plan: DG Lumbar Spine Complete, meloxicam (MOBIC) 7.5 MG tablet, Ambulatory referral to Spine Surgery  -Reassuring imaging, handout given on back pain, short-term meloxicam as above.  Lateral epicondylitis of left elbow - Plan: Apply other splint  -Counterforce brace applied as well as discussed use. Handout given.  Plan follow-up in the next 6 weeks for continued establish care, lab work likely at that visit.  Meds ordered this encounter  Medications   meloxicam (MOBIC) 7.5 MG tablet    Sig: Take 1 tablet (7.5 mg total) by mouth daily. As needed.    Dispense:  30 tablet    Refill:  0   Patient Instructions    I will refer you to Dr. Ron Agee for the neck pain and back pain.  See information below on back pain. Meloxicam once per day as needed for pain.  Do not combine with other anti-inflammatories such as Advil or Aleve.  I will let you know if there are any concerns on your neck or back x-rays.  I did obtain an x-ray of the right shoulder as well, but that may need to be discussed with orthopedics.  Can discuss with Dr. Ron Agee, but I will let you know if there are any concerns on the imaging from today.  Left elbow pain is likely  lateral epicondylitis.  Try using the counterforce brace and avoid repetitive activities as we discussed, see info below.   Follow-up in the next 6 weeks for blood work and can discuss your health history further at that time but please  let me know if there are questions sooner and thank you for coming in today.   Tennis Elbow  Tennis elbow (lateral epicondylitis) is inflammation of tendons in your outer forearm, near your elbow. Tendons are tissues that connect muscle to bone. When you have tennis elbow, inflammation affects the tendons that you use to bend your wrist and move your hand up. Inflammation occurs in the lower part of the upper arm bone (humerus), where the tendons connect to the bone (lateral epicondyle). Tennis elbow often affects people who play tennis, but anyone may get the condition from repeatedly extending the wrist or turning the forearm. What are the causes? This condition is usually caused by repeatedly extending the wrist, turning the forearm, and using the hands. It can result from sports or work that requires repetitive forearm movements. In some cases, it may be caused by a sudden injury. What increases the risk? You are more likely to develop tennis elbow if you play tennis or another racket sport. You also have a higher risk if you frequently use your hands for work. Besides people who play tennis, others at greater risk include:  Musicians.  Carpenters, painters, and plumbers.  Cooks.  Cashiers.  People who work in [employer].  Architect workers.  Butchers.  People who use computers. What are the signs or symptoms? Symptoms of this condition include:  Pain and tenderness in the forearm and the outer part of the elbow. Pain may be felt only when using the arm, or it may be there all the time.  A burning feeling that starts in the elbow and spreads down the forearm.  A weak grip in the hand. How is this diagnosed? This condition may be diagnosed  based on:  Your symptoms and medical history.  A physical exam.  X-rays.  MRI. How is this treated? Resting and icing your arm is often the first treatment. Your health care provider may also recommend:  Medicines to reduce pain and inflammation. These may be in the form of a pill, topical gels, or shots of a steroid medicine (cortisone).  An elbow strap to reduce stress on the area.  Physical therapy. This may include massage or exercises.  An elbow brace to restrict the movements that cause symptoms. If these treatments do not help relieve your symptoms, your health care provider may recommend surgery to remove damaged muscle and reattach healthy muscle to bone. Follow these instructions at home: Activity  Rest your elbow and wrist and avoid activities that cause symptoms, as told by your health care provider.  Do physical therapy exercises as instructed.  If you lift an object, lift it with your palm facing up. This reduces stress on your elbow. Lifestyle  If your tennis elbow is caused by sports, check your equipment and make sure that: ? You are using it correctly. ? It is the best fit for you.  If your tennis elbow is caused by work or computer use, take frequent breaks to stretch your arm. Talk with your manager about ways to manage your condition at work. If you have a brace:  Wear the brace or strap as told by your health care provider. Remove it only as told by your health care provider.  Loosen the brace if your fingers tingle, become numb, or turn cold and blue.  Keep the brace clean.  If the brace is not waterproof, ask if you may remove it for bathing. If you must keep the brace on while bathing: ?  Do not let it get wet. ? Cover it with a watertight covering when you take a bath or a shower. General instructions   If directed, put ice on the painful area: ? Put ice in a plastic bag. ? Place a towel between your skin and the bag. ? Leave the ice on for  20 minutes, 2-3 times a day.  Take over-the-counter and prescription medicines only as told by your health care provider.  Keep all follow-up visits as told by your health care provider. This is important. Contact a health care provider if:  You have pain that gets worse or does not get better with treatment.  You have numbness or weakness in your forearm, hand, or fingers. Summary  Tennis elbow (lateral epicondylitis) is inflammation of tendons in your outer forearm, near your elbow.  Common symptoms include pain and tenderness in your forearm and the outer part of your elbow.  This condition is usually caused by repeatedly extending your wrist, turning your forearm, and using your hands.  The first treatment is often resting and icing your arm to relieve symptoms. Further treatment may include taking medicine, getting physical therapy, wearing a brace or strap, or having surgery. This information is not intended to replace advice given to you by your health care provider. Make sure you discuss any questions you have with your health care provider. Document Revised: 04/16/2018 Document Reviewed: 05/05/2017 Elsevier Patient Education  [place].   Acute Back Pain, Adult Acute back pain is sudden and usually short-lived. It is often caused by an injury to the muscles and tissues in the back. The injury may result from:  A muscle or ligament getting overstretched or torn (strained). Ligaments are tissues that connect bones to each other. Lifting something improperly can cause a back strain.  Wear and tear (degeneration) of the spinal disks. Spinal disks are circular tissue that provides cushioning between the bones of the spine (vertebrae).  Twisting motions, such as while playing sports or doing yard work.  A hit to the back.  Arthritis. You may have a physical exam, lab tests, and imaging tests to find the cause of your pain. Acute back pain usually goes away with rest  and home care. Follow these instructions at home: Managing pain, stiffness, and swelling  Take over-the-counter and prescription medicines only as told by your health care provider.  Your health care provider may recommend applying ice during the first 24-48 hours after your pain starts. To do this: ? Put ice in a plastic bag. ? Place a towel between your skin and the bag. ? Leave the ice on for 20 minutes, 2-3 times a day.  If directed, apply heat to the affected area as often as told by your health care provider. Use the heat source that your health care provider recommends, such as a moist heat pack or a heating pad. ? Place a towel between your skin and the heat source. ? Leave the heat on for 20-30 minutes. ? Remove the heat if your skin turns bright red. This is especially important if you are unable to feel pain, heat, or cold. You have a greater risk of getting burned. Activity   Do not stay in bed. Staying in bed for more than 1-2 days can delay your recovery.  Sit up and stand up straight. Avoid leaning forward when you sit, or hunching over when you stand. ? If you work at a desk, sit close to it so you do  not need to lean over. Keep your chin tucked in. Keep your neck drawn back, and keep your elbows bent at a right angle. Your arms should look like the letter "L." ? Sit high and close to the steering wheel when you drive. Add lower back (lumbar) support to your car seat, if needed.  Take short walks on even surfaces as soon as you are able. Try to increase the length of time you walk each day.  Do not sit, drive, or stand in one place for more than 30 minutes at a time. Sitting or standing for long periods of time can put stress on your back.  Do not drive or use heavy machinery while taking prescription pain medicine.  Use proper lifting techniques. When you bend and lift, use positions that put less stress on your back: ? [place] your knees. ? Keep the load close to your  body. ? Avoid twisting.  Exercise regularly as told by your health care provider. Exercising helps your back heal faster and helps prevent back injuries by keeping muscles strong and flexible.  Work with a physical therapist to make a safe exercise program, as recommended by your health care provider. Do any exercises as told by your physical therapist. Lifestyle  Maintain a healthy weight. Extra weight puts stress on your back and makes it difficult to have good posture.  Avoid activities or situations that make you feel anxious or stressed. Stress and anxiety increase muscle tension and can make back pain worse. Learn ways to manage anxiety and stress, such as through exercise. General instructions  Sleep on a firm mattress in a comfortable position. Try lying on your side with your knees slightly bent. If you lie on your back, put a pillow under your knees.  Follow your treatment plan as told by your health care provider. This may include: ? Cognitive or behavioral therapy. ? Acupuncture or massage therapy. ? Meditation or yoga. Contact a health care provider if:  You have pain that is not relieved with rest or medicine.  You have increasing pain going down into your legs or buttocks.  Your pain does not improve after 2 weeks.  You have pain at night.  You lose weight without trying.  You have a fever or chills. Get help right away if:  You develop new bowel or bladder control problems.  You have unusual weakness or numbness in your arms or legs.  You develop nausea or vomiting.  You develop abdominal pain.  You feel faint. Summary  Acute back pain is sudden and usually short-lived.  Use proper lifting techniques. When you bend and lift, use positions that put less stress on your back.  Take over-the-counter and prescription medicines and apply heat or ice as directed by your health care provider. This information is not intended to replace advice given to you by  your health care provider. Make sure you discuss any questions you have with your health care provider. Document Revised: 11/09/2018 Document Reviewed: 03/04/2017 Elsevier Patient Education  [employer].    If you have lab work done today you will be contacted with your lab results within the next 2 weeks.  If you have not heard from [place] then please contact us. The fastest way to get your results is to register for My Chart.   IF you received an x-ray today, you will receive an invoice from [facility]. Please contact [facility] at [phone] with questions or concerns regarding your invoice.  IF you received labwork today, you will receive an invoice from [facility]. Please contact [facility] at [phone] with questions or concerns regarding your invoice.   Our billing staff will not be able to assist you with questions regarding bills from these companies.  You will be contacted with the lab results as soon as they are available. The fastest way to get your results is to activate your My Chart account. Instructions are located on the last page of this paperwork. If you have not heard from [place] regarding the results in 2 weeks, please contact this office.         Signed, Merri Ray, MD [facility]

## 2020-06-27 NOTE — Patient Instructions (Addendum)
I will refer you to Dr. Ron Agee for the neck pain and back pain.  See information below on back pain. Meloxicam once per day as needed for pain.  Do not combine with other anti-inflammatories such as Advil or Aleve.  I will let you know if there are any concerns on your neck or back x-rays.  I did obtain an x-ray of the right shoulder as well, but that may need to be discussed with orthopedics.  Can discuss with Dr. Ron Agee, but I will let you know if there are any concerns on the imaging from today.  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  Try using the counterforce brace and avoid repetitive activities as we discussed, see info below.   Follow-up in the next 6 weeks for blood work and can discuss your health history further at that time but please let me know if there are questions sooner and thank you for coming in today.   Tennis Elbow  Tennis elbow (lateral epicondylitis) is inflammation of tendons in your outer forearm, near your elbow. Tendons are tissues that connect muscle to bone. When you have tennis elbow, inflammation affects the tendons that you use to bend your wrist and move your hand up. Inflammation occurs in the lower part of the upper arm bone (humerus), where the tendons connect to the bone (lateral epicondyle). Tennis elbow often affects people who play tennis, but anyone may get the condition from repeatedly extending the wrist or turning the forearm. What are the causes? This condition is usually caused by repeatedly extending the wrist, turning the forearm, and using the hands. It can result from sports or work that requires repetitive forearm movements. In some cases, it may be caused by a sudden injury. What increases the risk? You are more likely to develop tennis elbow if you play tennis or another racket sport. You also have a higher risk if you frequently use your hands for work. Besides people who play tennis, others at greater risk  include:  Musicians.  Carpenters, painters, and plumbers.  Cooks.  Cashiers.  People who work in [employer].  Architect workers.  Butchers.  People who use computers. What are the signs or symptoms? Symptoms of this condition include:  Pain and tenderness in the forearm and the outer part of the elbow. Pain may be felt only when using the arm, or it may be there all the time.  A burning feeling that starts in the elbow and spreads down the forearm.  A weak grip in the hand. How is this diagnosed? This condition may be diagnosed based on:  Your symptoms and medical history.  A physical exam.  X-rays.  MRI. How is this treated? Resting and icing your arm is often the first treatment. Your health care provider may also recommend:  Medicines to reduce pain and inflammation. These may be in the form of a pill, topical gels, or shots of a steroid medicine (cortisone).  An elbow strap to reduce stress on the area.  Physical therapy. This may include massage or exercises.  An elbow brace to restrict the movements that cause symptoms. If these treatments do not help relieve your symptoms, your health care provider may recommend surgery to remove damaged muscle and reattach healthy muscle to bone. Follow these instructions at home: Activity  Rest your elbow and wrist and avoid activities that cause symptoms, as told by your health care provider.  Do physical therapy exercises as instructed.  If you lift an object,  lift it with your palm facing up. This reduces stress on your elbow. Lifestyle  If your tennis elbow is caused by sports, check your equipment and make sure that: ? You are using it correctly. ? It is the best fit for you.  If your tennis elbow is caused by work or computer use, take frequent breaks to stretch your arm. Talk with your manager about ways to manage your condition at work. If you have a brace:  Wear the brace or strap as told by your  health care provider. Remove it only as told by your health care provider.  Loosen the brace if your fingers tingle, become numb, or turn cold and blue.  Keep the brace clean.  If the brace is not waterproof, ask if you may remove it for bathing. If you must keep the brace on while bathing: ? Do not let it get wet. ? Cover it with a watertight covering when you take a bath or a shower. General instructions   If directed, put ice on the painful area: ? Put ice in a plastic bag. ? Place a towel between your skin and the bag. ? Leave the ice on for 20 minutes, 2-3 times a day.  Take over-the-counter and prescription medicines only as told by your health care provider.  Keep all follow-up visits as told by your health care provider. This is important. Contact a health care provider if:  You have pain that gets worse or does not get better with treatment.  You have numbness or weakness in your forearm, hand, or fingers. Summary  Tennis elbow (lateral epicondylitis) is inflammation of tendons in your outer forearm, near your elbow.  Common symptoms include pain and tenderness in your forearm and the outer part of your elbow.  This condition is usually caused by repeatedly extending your wrist, turning your forearm, and using your hands.  The first treatment is often resting and icing your arm to relieve symptoms. Further treatment may include taking medicine, getting physical therapy, wearing a brace or strap, or having surgery. This information is not intended to replace advice given to you by your health care provider. Make sure you discuss any questions you have with your health care provider. Document Revised: 04/16/2018 Document Reviewed: 05/05/2017 Elsevier Patient Education  [place].   Acute Back Pain, Adult Acute back pain is sudden and usually short-lived. It is often caused by an injury to the muscles and tissues in the back. The injury may result from:  A  muscle or ligament getting overstretched or torn (strained). Ligaments are tissues that connect bones to each other. Lifting something improperly can cause a back strain.  Wear and tear (degeneration) of the spinal disks. Spinal disks are circular tissue that provides cushioning between the bones of the spine (vertebrae).  Twisting motions, such as while playing sports or doing yard work.  A hit to the back.  Arthritis. You may have a physical exam, lab tests, and imaging tests to find the cause of your pain. Acute back pain usually goes away with rest and home care. Follow these instructions at home: Managing pain, stiffness, and swelling  Take over-the-counter and prescription medicines only as told by your health care provider.  Your health care provider may recommend applying ice during the first 24-48 hours after your pain starts. To do this: ? Put ice in a plastic bag. ? Place a towel between your skin and the bag. ? Leave the ice on for  20 minutes, 2-3 times a day.  If directed, apply heat to the affected area as often as told by your health care provider. Use the heat source that your health care provider recommends, such as a moist heat pack or a heating pad. ? Place a towel between your skin and the heat source. ? Leave the heat on for 20-30 minutes. ? Remove the heat if your skin turns bright red. This is especially important if you are unable to feel pain, heat, or cold. You have a greater risk of getting burned. Activity   Do not stay in bed. Staying in bed for more than 1-2 days can delay your recovery.  Sit up and stand up straight. Avoid leaning forward when you sit, or hunching over when you stand. ? If you work at a desk, sit close to it so you do not need to lean over. Keep your chin tucked in. Keep your neck drawn back, and keep your elbows bent at a right angle. Your arms should look like the letter "L." ? Sit high and close to the steering wheel when you drive.  Add lower back (lumbar) support to your car seat, if needed.  Take short walks on even surfaces as soon as you are able. Try to increase the length of time you walk each day.  Do not sit, drive, or stand in one place for more than 30 minutes at a time. Sitting or standing for long periods of time can put stress on your back.  Do not drive or use heavy machinery while taking prescription pain medicine.  Use proper lifting techniques. When you bend and lift, use positions that put less stress on your back: ? [place] your knees. ? Keep the load close to your body. ? Avoid twisting.  Exercise regularly as told by your health care provider. Exercising helps your back heal faster and helps prevent back injuries by keeping muscles strong and flexible.  Work with a physical therapist to make a safe exercise program, as recommended by your health care provider. Do any exercises as told by your physical therapist. Lifestyle  Maintain a healthy weight. Extra weight puts stress on your back and makes it difficult to have good posture.  Avoid activities or situations that make you feel anxious or stressed. Stress and anxiety increase muscle tension and can make back pain worse. Learn ways to manage anxiety and stress, such as through exercise. General instructions  Sleep on a firm mattress in a comfortable position. Try lying on your side with your knees slightly bent. If you lie on your back, put a pillow under your knees.  Follow your treatment plan as told by your health care provider. This may include: ? Cognitive or behavioral therapy. ? Acupuncture or massage therapy. ? Meditation or yoga. Contact a health care provider if:  You have pain that is not relieved with rest or medicine.  You have increasing pain going down into your legs or buttocks.  Your pain does not improve after 2 weeks.  You have pain at night.  You lose weight without trying.  You have a fever or chills. Get help  right away if:  You develop new bowel or bladder control problems.  You have unusual weakness or numbness in your arms or legs.  You develop nausea or vomiting.  You develop abdominal pain.  You feel faint. Summary  Acute back pain is sudden and usually short-lived.  Use proper lifting techniques. When you bend and  lift, use positions that put less stress on your back.  Take over-the-counter and prescription medicines and apply heat or ice as directed by your health care provider. This information is not intended to replace advice given to you by your health care provider. Make sure you discuss any questions you have with your health care provider. Document Revised: 11/09/2018 Document Reviewed: 03/04/2017 Elsevier Patient Education  [employer].    If you have lab work done today you will be contacted with your lab results within the next 2 weeks.  If you have not heard from [place] then please contact us. The fastest way to get your results is to register for My Chart.   IF you received an x-ray today, you will receive an invoice from [facility]. Please contact [facility] at [phone] with questions or concerns regarding your invoice.   IF you received labwork today, you will receive an invoice from [place]. Please contact [facility] at [phone] with questions or concerns regarding your invoice.   Our billing staff will not be able to assist you with questions regarding bills from these companies.  You will be contacted with the lab results as soon as they are available. The fastest way to get your results is to activate your My Chart account. Instructions are located on the last page of this paperwork. If you have not heard from [place] regarding the results in 2 weeks, please contact this office.

## 2020-07-02 ENCOUNTER — Telehealth: Payer: Self-pay

## 2020-07-02 NOTE — Telephone Encounter (Signed)
Pt request for records has been faxed successfully on 07/02/2020 copy sent for scan

## 2020-07-12 ENCOUNTER — Encounter: Payer: Self-pay | Admitting: Family Medicine

## 2020-10-12 ENCOUNTER — Ambulatory Visit: Payer: Managed Care, Other (non HMO) | Admitting: Family Medicine

## 2020-10-12 ENCOUNTER — Other Ambulatory Visit: Payer: Self-pay

## 2020-10-12 ENCOUNTER — Encounter: Payer: Self-pay | Admitting: Family Medicine

## 2020-10-12 VITALS — BP 136/84 | HR 73 | Temp 97.6°F | Ht 70.0 in | Wt 231.0 lb

## 2020-10-12 DIAGNOSIS — M542 Cervicalgia: Secondary | ICD-10-CM

## 2020-10-12 DIAGNOSIS — M5481 Occipital neuralgia: Secondary | ICD-10-CM

## 2020-10-12 DIAGNOSIS — H5789 Other specified disorders of eye and adnexa: Secondary | ICD-10-CM | POA: Diagnosis not present

## 2020-10-12 DIAGNOSIS — M5412 Radiculopathy, cervical region: Secondary | ICD-10-CM

## 2020-10-12 DIAGNOSIS — E781 Pure hyperglyceridemia: Secondary | ICD-10-CM

## 2020-10-12 DIAGNOSIS — R7303 Prediabetes: Secondary | ICD-10-CM

## 2020-10-12 LAB — LIPID PANEL

## 2020-10-12 LAB — COMPREHENSIVE METABOLIC PANEL
Alkaline Phosphatase: 62 IU/L (ref 44–121)
Calcium: 9 mg/dL (ref 8.7–10.2)

## 2020-10-12 MED ORDER — AZELASTINE HCL 0.05 % OP SOLN: 1.0000 [drp] | Freq: Two times a day (BID) | OPHTHALMIC | 12 refills | Status: DC

## 2020-10-12 NOTE — Patient Instructions (Addendum)
  Try synthetic eye drops such as Systane.  If no relief - can try optivar allergy drops, but keep follow up with eye doctor.  Return to the clinic or go to the nearest emergency room if any of your symptoms worsen or new symptoms occur.    If you have lab work done today you will be contacted with your lab results within the next 2 weeks.  If you have not heard from [place] then please contact us. The fastest way to get your results is to register for My Chart.   IF you received an x-ray today, you will receive an invoice from [facility]. Please contact [facility] at [phone] with questions or concerns regarding your invoice.   IF you received labwork today, you will receive an invoice from [place]. Please contact [facility] at [phone] with questions or concerns regarding your invoice.   Our billing staff will not be able to assist you with questions regarding bills from these companies.  You will be contacted with the lab results as soon as they are available. The fastest way to get your results is to activate your My Chart account. Instructions are located on the last page of this paperwork. If you have not heard from [place] regarding the results in 2 weeks, please contact this office.

## 2020-10-12 NOTE — Progress Notes (Signed)
Subjective:  Patient ID: Brian Reid, male    DOB: [birth year]  Age: 45 y.o. MRN: [id number]  CC:  Chief Complaint  Patient presents with  . Neck Pain    Pt reports seeing Dr.Lando for this neck pain and was given meloxicam and tizaniden. Pt reports he feels better with the medication, but gets a tingling sensation up his neck and over his skull. Pt is wondering if something eles is going on. Pt reports weakness at times and burning in his eyes at times.     HPI Brian Reid presents for   Neck pain Has been treated by Dr. Ron Agee and treated with meloxicam and tizanidine. Some relief of symptoms. Tingling down left arm at times, some into back of neck and skull.  No neck injections or recent prednisone. Improving.  Left upper arm feels weak at time, no loss of grip strength.  Has also been undergoing neck PT, traction. Option of home traction.  Tingling on top of left scalp as well on left side-  Happens on and off.  MRI discussed, plan of having done in [place] - later this year. Follow up as needed with orhto.   Burning in eyes: Notes in am, no drainage/discharge. Burning in am only. Improves during day.  Has appt with optho on 3/20.  Does look at computer frequently.  No sneezing/cough/runny nose.  Off and on for months.   History of hypertriglyceridemia, hyperglycemia  - prediabetes last labs in 2017.  Fasting today.  Lab Results  Component Value Date   HGBA1C 6.0 (H) 02/26/2016   Lab Results  Component Value Date   CHOL 199 02/26/2016   HDL 34 (L) 02/26/2016   LDLCALC 125 02/26/2016   TRIG 200 (H) 02/26/2016   CHOLHDL 5.9 (H) 02/26/2016     History Patient Active Problem List   Diagnosis Date Noted  . Acute appendicitis s/p lap appendectomy 04/02/2016 04/03/2016   No past medical history on file. Past Surgical History:  Procedure Laterality Date  . APPENDECTOMY N/A    Phreesia 06/27/2020  . LAPAROSCOPIC APPENDECTOMY N/A 04/02/2016   Procedure: APPENDECTOMY  LAPAROSCOPIC;  Surgeon: Autumn Messing III, MD;  Location: [facility];  Service: General;  Laterality: N/A;  . LEG SURGERY     age 62  . OPEN REDUCTION SHOULDER DISLOCATION     age 30   No Known Allergies Prior to Admission medications   Medication Sig Start Date End Date Taking? Authorizing Provider  meloxicam (MOBIC) 7.5 MG tablet Take 1 tablet (7.5 mg total) by mouth daily. As needed. 06/27/20   Wendie Agreste, MD  tiZANidine (ZANAFLEX) 4 MG tablet TAKE 1/2 TABLET BY MOUTH THREE TIMES DAILY AS NEEDED FOR PAIN 09/24/20   [provider]   Social History   Socioeconomic History  . Marital status: Married    Spouse name: Not on file  . Number of children: 2  . Years of education: post grad  . Highest education level: Not on file  Occupational History    Comment: IT professional  Tobacco Use  . Smoking status: Former Research scientist (life sciences)  . Smokeless tobacco: Never Used  . Tobacco comment: 03/05/16 "very occasional now", 06/06/16 not smoking  Vaping Use  . Vaping Use: Never used  Substance and Sexual Activity  . Alcohol use: Yes    Alcohol/week: 2.0 standard drinks    Types: 2 Standard drinks or equivalent per week    Comment: social  . Drug use: No  . Sexual activity: Yes  Other Topics Concern  . Not on file  Social History Narrative   Lives with family   Drinks 1 cup of coffee a day    Social Determinants of Health   Financial Resource Strain: Not on file  Food Insecurity: Not on file  Transportation Needs: Not on file  Physical Activity: Not on file  Stress: Not on file  Social Connections: Not on file  Intimate Partner Violence: Not on file    Review of Systems   Objective:   Vitals:   10/12/20 0858 10/12/20 0923  BP: (!) 153/96 136/84  Pulse: 73   Temp: 97.6 F (36.4 C)   TempSrc: Temporal   Weight: 231 lb (104.8 kg)   Height: 5' 10"  (1.778 m)      Physical Exam Vitals reviewed.  Constitutional:      General: He is not in acute distress.    Appearance: He  is well-developed.  HENT:     Head: Normocephalic and atraumatic.  Eyes:     General:        Right eye: No discharge.        Left eye: No discharge.     Extraocular Movements: Extraocular movements intact.     Conjunctiva/sclera: Conjunctivae normal.     Pupils: Pupils are equal, round, and reactive to light.  Cardiovascular:     Rate and Rhythm: Normal rate.  Pulmonary:     Effort: Pulmonary effort is normal.     Breath sounds: Normal breath sounds.  Musculoskeletal:     Comments: Equal extremity strength, grip strength intact equally, no focal weakness.  Reflexes difficult to obtain but bilateral. Slight decreased rotation, extension of C-spine slight discomfort to the dorsal left shoulder with right lateral neck flexion.  No arm radicular symptoms during exam.   Skin:    General: Skin is warm and dry.  Neurological:     General: No focal deficit present.     Mental Status: He is alert and oriented to person, place, and time.     Comments: Scalp nontender, no rash.        Assessment & Plan:  Brian Reid is a 45 y.o. male . Occipital neuralgia of left side - Plan: Ambulatory referral to Neurology Cervical radiculopathy Neck pain  -Continue same treatment plan from orthopedics, but will refer to neurology as possible component of occipital neuralgia, could consider meds such as gabapentin.  Hold on new meds at this time.  Eye irritation - Plan: azelastine (OPTIVAR) 0.05 % ophthalmic solution  -Dry eyes versus allergies.  Trial of over-the-counter rewetting drops/synthetic tears, with option of Optivar and keep follow-up with ophthalmology.  Hypertriglyceridemia - Plan: Comprehensive metabolic panel, Lipid panel Prediabetes - Plan: Hemoglobin A1c  -Check labs, 6 months follow-up for physical, possibly sooner depending on results above.  Meds ordered this encounter  Medications  . azelastine (OPTIVAR) 0.05 % ophthalmic solution    Sig: Place 1 drop into both eyes 2 (two)  times daily.    Dispense:  6 mL    Refill:  12   Patient Instructions    Try synthetic eye drops such as Systane.  If no relief - can try optivar allergy drops, but keep follow up with eye doctor.  Return to the clinic or go to the nearest emergency room if any of your symptoms worsen or new symptoms occur.    If you have lab work done today you will be contacted with your lab results within the next 2 weeks.  If you have not heard from [place] then please contact us. The fastest way to get your results is to register for My Chart.   IF you received an x-ray today, you will receive an invoice from [facility]. Please contact [facility] at [phone] with questions or concerns regarding your invoice.   IF you received labwork today, you will receive an invoice from [place]. Please contact [facility] at [phone] with questions or concerns regarding your invoice.   Our billing staff will not be able to assist you with questions regarding bills from these companies.  You will be contacted with the lab results as soon as they are available. The fastest way to get your results is to activate your My Chart account. Instructions are located on the last page of this paperwork. If you have not heard from [place] regarding the results in 2 weeks, please contact this office.         Signed, Merri Ray, MD [facility]

## 2020-10-13 LAB — COMPREHENSIVE METABOLIC PANEL
ALT: 35 IU/L (ref 0–44)
AST: 17 IU/L (ref 0–40)
Albumin/Globulin Ratio: 2 (ref 1.2–2.2)
Albumin: 4.6 g/dL (ref 4.0–5.0)
BUN/Creatinine Ratio: 13 (ref 9–20)
BUN: 13 mg/dL (ref 6–24)
Bilirubin Total: 0.5 mg/dL (ref 0.0–1.2)
CO2: 27 mmol/L (ref 20–29)
Chloride: 102 mmol/L (ref 96–106)
Creatinine, Ser: 0.98 mg/dL (ref 0.76–1.27)
Globulin, Total: 2.3 g/dL (ref 1.5–4.5)
Glucose: 106 mg/dL — ABNORMAL HIGH (ref 65–99)
Potassium: 4.3 mmol/L (ref 3.5–5.2)
Sodium: 140 mmol/L (ref 134–144)
Total Protein: 6.9 g/dL (ref 6.0–8.5)
eGFR: 98 mL/min/{1.73_m2} (ref >59)

## 2020-10-13 LAB — LIPID PANEL
Cholesterol, Total: 226 mg/dL — ABNORMAL HIGH (ref 100–199)
HDL: 37 mg/dL — ABNORMAL LOW (ref >39)
LDL Chol Calc (NIH): 161 mg/dL — ABNORMAL HIGH (ref 0–99)
Triglycerides: 154 mg/dL — ABNORMAL HIGH (ref 0–149)

## 2020-10-13 LAB — HEMOGLOBIN A1C
Est. average glucose Bld gHb Est-mCnc: 131 mg/dL
Hgb A1c MFr Bld: 6.2 % — ABNORMAL HIGH (ref 4.8–5.6)

## 2020-11-13 ENCOUNTER — Ambulatory Visit (INDEPENDENT_AMBULATORY_CARE_PROVIDER_SITE_OTHER): Payer: Managed Care, Other (non HMO) | Admitting: Diagnostic Neuroimaging

## 2020-11-13 ENCOUNTER — Encounter: Payer: Self-pay | Admitting: Diagnostic Neuroimaging

## 2020-11-13 ENCOUNTER — Other Ambulatory Visit: Payer: Self-pay

## 2020-11-13 VITALS — BP 144/92 | HR 63 | Ht 70.0 in | Wt 235.0 lb

## 2020-11-13 DIAGNOSIS — M5481 Occipital neuralgia: Secondary | ICD-10-CM | POA: Diagnosis not present

## 2020-11-13 NOTE — Progress Notes (Signed)
[facility]  PATIENT: Brian Reid DOB: [birth year]  REFERRING CLINICIAN: Philis Fendt, PA-c HISTORY FROM: patient  REASON FOR VISIT: follow up   HISTORICAL  CHIEF COMPLAINT:  Chief Complaint  Patient presents with  . Occipital neuralgia vs cervical radiculopathy    Rm 6 New Pt "neck pain constantly, severity varies, Mobic and tizanidine helped some but pain is always there, no history of injury"    HISTORY OF PRESENT ILLNESS:   UPDATE (11/13/20, VRP): Since last visit, exertional HA resolved. Some intermittent left neck pain and left occ neuralgia. Seeing pain clinic (Dr. Ron Agee) and planning for MRI cervical spine in summer 2022. Also with recurrent nosebleeding with exercise. Also with increased gastritis and left occ pain when he has less sleep.   UPDATE 06/06/16: Since last visit patient's headaches have resolved. Patient has found that sleeping on the floor or other firm surface decreases his snoring and headaches. When he sleeps on his usual mattress his headaches and snoring return. Patient having intermittent left posterior neck pain when he turns his head to the right. Patient has been more physically active lately and trying to improve his general health.  PRIOR HPI (03/05/16): 45 year old male with new onset headaches starting 3 weeks ago. Patient reports intermittent episodes of pain radiating from neck, occipital region and chin to the top of his head. At aches last 10-20 minutes a time. He said 4-5 events, 3 provoked with exertion and 2 unprovoked. No vision changes, nausea, vomiting, sensitivity to light or sound. One event occurred with sexual activity. 2 other events occurred with exercise activity. 6-7 months ago patient started having increasing neck pain. For past 3 months patient has been doing increased driving commute, over one hour each way to [facility]. Patient has trying some mild exercise up to 20 minutes at a time and has been able  to tolerate this without triggering headaches.  Patient had a different type of headache in 2015 which was self-limited. No other recent traumas, accidents, infections, change in sleep or stress. Patient does have history of snoring and intermittent daytime fatigue.   REVIEW OF SYSTEMS: Full 14 system review of systems performed and negative with exception of: Only as per history of present illness.  ALLERGIES: No Known Allergies  HOME MEDICATIONS: Outpatient Medications Prior to Visit  Medication Sig Dispense Refill  . azelastine (OPTIVAR) 0.05 % ophthalmic solution Place 1 drop into both eyes 2 (two) times daily. 6 mL 12  . meloxicam (MOBIC) 7.5 MG tablet Take 1 tablet (7.5 mg total) by mouth daily. As needed. (Patient not taking: Reported on 11/13/2020) 30 tablet 0  . tiZANidine (ZANAFLEX) 4 MG tablet TAKE 1/2 TABLET BY MOUTH THREE TIMES DAILY AS NEEDED FOR PAIN (Patient not taking: Reported on 11/13/2020)     No facility-administered medications prior to visit.    PAST MEDICAL HISTORY: No past medical history on file.  PAST SURGICAL HISTORY: Past Surgical History:  Procedure Laterality Date  . APPENDECTOMY N/A    [employer] 06/27/2020  . LAPAROSCOPIC APPENDECTOMY N/A 04/02/2016   Procedure: APPENDECTOMY LAPAROSCOPIC;  Surgeon: Autumn Messing III, MD;  Location: [facility];  Service: General;  Laterality: N/A;  . LEG SURGERY     age 57  . OPEN REDUCTION SHOULDER DISLOCATION     age 76    FAMILY HISTORY: Family History  Problem Relation Age of Onset  . Diabetes Mother   . Hypertension Mother   . Stroke Mother   . Diabetes Father   .  Heart disease Father   . Hypertension Sister     SOCIAL HISTORY:  Social History   Socioeconomic History  . Marital status: Married    Spouse name: Deneise Lever  . Number of children: 2  . Years of education: post grad  . Highest education level: Not on file  Occupational History    Comment: IT professional  Tobacco Use  . Smoking status: Former  Research scientist (life sciences)  . Smokeless tobacco: Never Used  . Tobacco comment: 03/05/16 "very occasional now", 06/06/16 not smoking  Vaping Use  . Vaping Use: Never used  Substance and Sexual Activity  . Alcohol use: Yes    Alcohol/week: 2.0 standard drinks    Types: 2 Standard drinks or equivalent per week    Comment: social  . Drug use: No  . Sexual activity: Yes  Other Topics Concern  . Not on file  Social History Narrative   Lives with family   Drinks 1 cup of coffee a day    Social Determinants of Health   Financial Resource Strain: Not on file  Food Insecurity: Not on file  Transportation Needs: Not on file  Physical Activity: Not on file  Stress: Not on file  Social Connections: Not on file  Intimate Partner Violence: Not on file     PHYSICAL EXAM  GENERAL EXAM/CONSTITUTIONAL: Vitals:  Vitals:   11/13/20 0853 11/13/20 0858  BP: (!) 153/100 (!) 144/92  Pulse: (!) 59 63  Weight: 235 lb (106.6 kg)   Height: 5' 10"  (1.778 m)    Wt Readings from Last 3 Encounters:  11/13/20 235 lb (106.6 kg)  10/12/20 231 lb (104.8 kg)  06/27/20 237 lb (107.5 kg)    Body mass index is 33.72 kg/m. No exam data present  Patient is in no distress; well developed, nourished and groomed; neck is supple  CARDIOVASCULAR:  Examination of carotid arteries is normal; no carotid bruits  Regular rate and rhythm, no murmurs  Examination of peripheral vascular system by observation and palpation is normal  EYES:  Ophthalmoscopic exam of optic discs and posterior segments is normal; no papilledema or hemorrhages  MUSCULOSKELETAL:  Gait, strength, tone, movements noted in Neurologic exam below  NEUROLOGIC: MENTAL STATUS:  No flowsheet data found.  awake, alert, oriented to person, place and time  recent and remote memory intact  normal attention and concentration  language fluent, comprehension intact, naming intact,   fund of knowledge appropriate  CRANIAL NERVE:   2nd - no  papilledema on fundoscopic exam  2nd, 3rd, 4th, 6th - pupils equal and reactive to light, visual fields full to confrontation, extraocular muscles intact, no nystagmus  5th - facial sensation symmetric  7th - facial strength symmetric  8th - hearing intact  9th - palate elevates symmetrically, uvula midline  11th - shoulder shrug symmetric  12th - tongue protrusion midline  MOTOR:   normal bulk and tone, full strength in the BUE, BLE  SENSORY:   normal and symmetric to light touch,  COORDINATION:   finger-nose-finger, fine finger movements normal  REFLEXES:   deep tendon reflexes present and symmetric  GAIT/STATION:   narrow based gait    DIAGNOSTIC DATA (LABS, IMAGING, TESTING) - I reviewed patient records, labs, notes, testing and imaging myself where available.  Lab Results  Component Value Date   WBC 16.5 (A) 04/02/2016   HGB 16.0 04/02/2016   HCT 47.0 04/02/2016   MCV 77.8 (A) 04/02/2016   PLT 200 02/26/2016      Component  Value Date/Time   NA 140 10/12/2020 1007   K 4.3 10/12/2020 1007   CL 102 10/12/2020 1007   CO2 27 10/12/2020 1007   GLUCOSE 106 (H) 10/12/2020 1007   GLUCOSE 120 (H) 04/02/2016 2025   BUN 13 10/12/2020 1007   CREATININE 0.98 10/12/2020 1007   CREATININE 0.97 04/02/2016 1410   CALCIUM 9.0 10/12/2020 1007   PROT 6.9 10/12/2020 1007   ALBUMIN 4.6 10/12/2020 1007   AST 17 10/12/2020 1007   ALT 35 10/12/2020 1007   ALKPHOS 62 10/12/2020 1007   BILITOT 0.5 10/12/2020 1007   GFRNONAA >89 04/02/2016 1410   GFRAA >89 04/02/2016 1410   Lab Results  Component Value Date   CHOL 226 (H) 10/12/2020   HDL 37 (L) 10/12/2020   LDLCALC 161 (H) 10/12/2020   TRIG 154 (H) 10/12/2020   CHOLHDL 6.1 (H) 10/12/2020   Lab Results  Component Value Date   HGBA1C 6.2 (H) 10/12/2020   No results found for: [id number] Lab Results  Component Value Date   TSH 2.10 02/26/2016    02/26/16 CTA head [I reviewed images myself and agree with  interpretation. -VRP]  - Negative head CTA.  No evidence of aneurysm or mass.    ASSESSMENT AND PLAN  45 y.o. year old male here with new onset headaches in August 2017, with several events occurring with exercise or sexual activity exertion. Neurologic examination otherwise unremarkable. Patient now able to tolerate physical activity without triggering headaches. Reassured patient and encouraged to gradually increase activity.    Dx:   1. Cervico-occipital neuralgia of left side      PLAN:   LEFT OCCIPITAL NEURALGIA / NECK PAIN - consider occipital nerve block / cervical epidural injections (per Dr. Ron Agee)  RECURRENT NOSEBLEEDING (with exercise) - consider PCP or ENT follow up  FATIGUE / SNORING - consider sleep study to rule out obstructive sleep apnea   Return for return to PCP, pending if symptoms worsen or fail to improve.    Penni Bombard, MD 0/10/7046, 8:89 AM Certified in Neurology, Neurophysiology and Neuroimaging  [facility] [address] [place], [place] [address] [phone]

## 2020-11-13 NOTE — Patient Instructions (Signed)
LEFT OCCIPITAL NEURALGIA / NECK PAIN - consider occipital nerve block / cervical epidural injections (per Dr. Ron Agee)  RECURRENT NOSEBLEEDING (with exercise) - consider PCP or ENT follow up  FATIGUE / SNORING - consider sleep study to rule out obstructive sleep apnea

## 2021-03-13 ENCOUNTER — Ambulatory Visit (INDEPENDENT_AMBULATORY_CARE_PROVIDER_SITE_OTHER): Payer: Managed Care, Other (non HMO) | Admitting: Family Medicine

## 2021-03-13 ENCOUNTER — Encounter: Payer: Self-pay | Admitting: Family Medicine

## 2021-03-13 ENCOUNTER — Other Ambulatory Visit: Payer: Self-pay

## 2021-03-13 VITALS — BP 136/88 | HR 60 | Temp 98.1°F | Resp 15 | Ht 70.0 in | Wt 242.8 lb

## 2021-03-13 DIAGNOSIS — Z0000 Encounter for general adult medical examination without abnormal findings: Secondary | ICD-10-CM | POA: Diagnosis not present

## 2021-03-13 DIAGNOSIS — Z1211 Encounter for screening for malignant neoplasm of colon: Secondary | ICD-10-CM | POA: Diagnosis not present

## 2021-03-13 DIAGNOSIS — M25522 Pain in left elbow: Secondary | ICD-10-CM

## 2021-03-13 DIAGNOSIS — R040 Epistaxis: Secondary | ICD-10-CM | POA: Diagnosis not present

## 2021-03-13 DIAGNOSIS — M5481 Occipital neuralgia: Secondary | ICD-10-CM

## 2021-03-13 DIAGNOSIS — E781 Pure hyperglyceridemia: Secondary | ICD-10-CM | POA: Diagnosis not present

## 2021-03-13 DIAGNOSIS — R7303 Prediabetes: Secondary | ICD-10-CM | POA: Diagnosis not present

## 2021-03-13 DIAGNOSIS — R030 Elevated blood-pressure reading, without diagnosis of hypertension: Secondary | ICD-10-CM

## 2021-03-13 DIAGNOSIS — R0683 Snoring: Secondary | ICD-10-CM

## 2021-03-13 LAB — COMPREHENSIVE METABOLIC PANEL
ALT: 40 U/L (ref 0–53)
AST: 24 U/L (ref 0–37)
Albumin: 4.4 g/dL (ref 3.5–5.2)
Alkaline Phosphatase: 46 U/L (ref 39–117)
BUN: 10 mg/dL (ref 6–23)
CO2: 29 mEq/L (ref 19–32)
Calcium: 9 mg/dL (ref 8.4–10.5)
Chloride: 100 mEq/L (ref 96–112)
Creatinine, Ser: 1.02 mg/dL (ref 0.40–1.50)
GFR: 88.91 mL/min (ref >60.00)
Glucose, Bld: 142 mg/dL — ABNORMAL HIGH (ref 70–99)
Potassium: 3.9 mEq/L (ref 3.5–5.1)
Sodium: 139 mEq/L (ref 135–145)
Total Bilirubin: 0.8 mg/dL (ref 0.2–1.2)
Total Protein: 6.6 g/dL (ref 6.0–8.3)

## 2021-03-13 LAB — CBC WITH DIFFERENTIAL/PLATELET
Basophils Absolute: 0 10*3/uL (ref 0.0–0.1)
Basophils Relative: 0.6 % (ref 0.0–3.0)
Eosinophils Absolute: 0.1 10*3/uL (ref 0.0–0.7)
Eosinophils Relative: 1.8 % (ref 0.0–5.0)
HCT: 41 % (ref 39.0–52.0)
Hemoglobin: 13.6 g/dL (ref 13.0–17.0)
Lymphocytes Relative: 40.7 % (ref 12.0–46.0)
Lymphs Abs: 2.4 10*3/uL (ref 0.7–4.0)
MCHC: 33.1 g/dL (ref 30.0–36.0)
MCV: 79.9 fl (ref 78.0–100.0)
Monocytes Absolute: 0.5 10*3/uL (ref 0.1–1.0)
Monocytes Relative: 8.2 % (ref 3.0–12.0)
Neutro Abs: 2.8 10*3/uL (ref 1.4–7.7)
Neutrophils Relative %: 48.7 % (ref 43.0–77.0)
Platelets: 175 10*3/uL (ref 150.0–400.0)
RBC: 5.14 Mil/uL (ref 4.22–5.81)
RDW: 14.1 % (ref 11.5–15.5)
WBC: 5.8 10*3/uL (ref 4.0–10.5)

## 2021-03-13 LAB — LIPID PANEL
Cholesterol: 212 mg/dL — ABNORMAL HIGH (ref 0–200)
HDL: 35.4 mg/dL — ABNORMAL LOW (ref >39.00)
NonHDL: 176.65
Total CHOL/HDL Ratio: 6
Triglycerides: 318 mg/dL — ABNORMAL HIGH (ref 0.0–149.0)
VLDL: 63.6 mg/dL — ABNORMAL HIGH (ref 0.0–40.0)

## 2021-03-13 LAB — HEMOGLOBIN A1C: Hgb A1c MFr Bld: 7.6 % — ABNORMAL HIGH (ref 4.6–6.5)

## 2021-03-13 LAB — LDL CHOLESTEROL, DIRECT: Direct LDL: 129 mg/dL

## 2021-03-13 MED ORDER — MELOXICAM 7.5 MG PO TABS: 7.5000 mg | ORAL_TABLET | Freq: Every day | ORAL | 0 refills | Status: DC

## 2021-03-13 NOTE — Progress Notes (Signed)
Subjective:  Patient ID: Brian Reid, male    DOB: 12/24/1975  Age: 45 y.o. MRN: [id number]  CC:  Chief Complaint  Patient presents with   Annual Exam    Pt her for physical today no concerns, pt denies need for refill     HPI Brian Reid presents for   Annual exam and other concerns below.   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 Recommended to consider occipital nerve block or cervical epidural injections.  Note it was mentioned that he had recurrent nosebleeds with exercise, possible ENT follow-up.  discussed snoring with option of sleep study to rule out OSA.  Met with sleep specialist in 2017, plan for home sleep test. Takes meloxicam if needed - 1-2 per month for mild symptoms, deferring nerve block unless symptoms worse.   Epistaxis Past few years. Notes with exertion/exercise. Not during exercise, notices a day or two after exercising - stops with pressure. Nose feels dry. Has not met with ENT in past.   L elbow pain Notes past few weeks with bending arm. Nki.  No attempted treatments. Feels further in than prior tennis elbow symptoms. Moving to new house past few months. Has tennis elbow brace - not using.   Snoring:  Episodic. Usually well rested. Did not have sleep test in 2017. Borderline BP. Prior HA - not recent..  BP Readings from Last 3 Encounters:  03/13/21 136/88  11/13/20 (!) 144/92  10/12/20 136/84   Prediabetes: No current meds, weight increased since April. Last 3 months - less exercise. Fast food/takeout/restaurant food: more travel past few months -1-2 fast food per week.  Sweet tea/soda/sugary-containing beverages: past few months drinking more.   Lab Results  Component Value Date   HGBA1C 6.2 (H) 10/12/2020   Wt Readings from Last 3 Encounters:  03/13/21 242 lb 12.8 oz (110.1 kg)  11/13/20 235 lb (106.6 kg)  10/12/20 231 lb (104.8 kg)   Hyperlipidemia: diet/exercise  approach planned - decreased exercise and diet adherence lately.  Lab Results  Component Value Date   CHOL 226 (H) 10/12/2020   HDL 37 (L) 10/12/2020   LDLCALC 161 (H) 10/12/2020   TRIG 154 (H) 10/12/2020   CHOLHDL 6.1 (H) 10/12/2020   Lab Results  Component Value Date   ALT 35 10/12/2020   AST 17 10/12/2020   ALKPHOS 62 10/12/2020   BILITOT 0.5 10/12/2020  The 10-year ASCVD risk score Mikey Bussing DC Jr., et al., 2013) is: 4%   Values used to calculate the score:     Age: 78 years     Sex: Male     Is Non-Hispanic African American: No     Diabetic: No     Tobacco smoker: No     Systolic Blood Pressure: 038 mmHg     Is BP treated: No     HDL Cholesterol: 37 mg/dL     Total Cholesterol: 226 mg/dL    Depression screen [facility] 2/9 03/13/2021 10/12/2020 06/27/2020 04/02/2016 02/26/2016  Decreased Interest 0 0 0 0 0  Down, Depressed, Hopeless 0 0 0 0 0  PHQ - 2 Score 0 0 0 0 0   Immunization History  Administered Date(s) Administered   PFIZER Comirnaty(Gray Top)Covid-19 Tri-Sucrose Vaccine 11/15/2019, 12/06/2019, 07/31/2020  Tetanus: unknown but within 10 years.    Cancer screening Screening options with colonoscopy versus Cologuard discussed. Discussed timing of repeat testing intervals if normal, as well as potential need for diagnostic Colonoscopy if  positive Cologuard. Understanding expressed, and chose Cologuard.  No FH of prostate CA, father with enlarged prostate The natural history of prostate cancer and ongoing controversy regarding screening and potential treatment outcomes of prostate cancer has been discussed with the patient. The meaning of a false positive PSA and a false negative PSA has been discussed. He indicates understanding of the limitations of this screening test and wishes NOT to proceed with screening PSA testing.    Vision Screening   Right eye Left eye Both eyes  Without correction _0  With correction     Reading glasses. Has eye specialist -  appt 7 months ago.   Dental: none.   Alcohol: few drinks every few weeks.   Tobacco:none.   Exercise:as above - less past few months.         History Patient Active Problem List   Diagnosis Date Noted   Acute appendicitis s/p lap appendectomy 04/02/2016 04/03/2016   History reviewed. No pertinent past medical history. Past Surgical History:  Procedure Laterality Date   APPENDECTOMY N/A    [employer] 06/27/2020   LAPAROSCOPIC APPENDECTOMY N/A 04/02/2016   Procedure: APPENDECTOMY LAPAROSCOPIC;  Surgeon: Autumn Messing III, MD;  Location: [facility];  Service: General;  Laterality: N/A;   LEG SURGERY     age 72   OPEN REDUCTION SHOULDER DISLOCATION     age 54   No Known Allergies Prior to Admission medications   Medication Sig Start Date End Date Taking? Authorizing Provider  meloxicam (MOBIC) 7.5 MG tablet Take 1 tablet (7.5 mg total) by mouth daily. As needed. 06/27/20  Yes Wendie Agreste, MD   Social History   Socioeconomic History   Marital status: Married    Spouse name: Deneise Lever   Number of children: 2   Years of education: post grad   Highest education level: Not on file  Occupational History    Comment: IT professional  Tobacco Use   Smoking status: Former   Smokeless tobacco: Never   Tobacco comments:    03/05/16 "very occasional now", 06/06/16 not smoking  Vaping Use   Vaping Use: Never used  Substance and Sexual Activity   Alcohol use: Yes    Alcohol/week: 2.0 standard drinks    Types: 2 Standard drinks or equivalent per week    Comment: social   Drug use: No   Sexual activity: Yes  Other Topics Concern   Not on file  Social History Narrative   Lives with family   Drinks 1 cup of coffee a day    Social Determinants of Health   Financial Resource Strain: Not on file  Food Insecurity: Not on file  Transportation Needs: Not on file  Physical Activity: Not on file  Stress: Not on file  Social Connections: Not on file  Intimate Partner Violence: Not on  file    Review of Systems 13 point review of systems per patient health survey noted.  Negative other than as indicated above or in HPI.    Objective:   Vitals:   03/13/21 0814  BP: 136/88  Pulse: 60  Resp: 15  Temp: 98.1 F (36.7 C)  TempSrc: Temporal  SpO2: 97%  Weight: 242 lb 12.8 oz (110.1 kg)  Height: _1  (1.778 m)     Physical Exam Vitals reviewed.  Constitutional:      Appearance: He is well-developed.  HENT:     Head: Normocephalic and atraumatic.     Right Ear: External ear normal.  Left Ear: External ear normal.     Nose: Nose normal.     Comments: No visible blood or prominent vessels noted on septum or within nasal passages visualized on exam. Eyes:     Conjunctiva/sclera: Conjunctivae normal.     Pupils: Pupils are equal, round, and reactive to light.  Neck:     Thyroid: No thyromegaly.  Cardiovascular:     Rate and Rhythm: Normal rate and regular rhythm.     Heart sounds: Normal heart sounds.  Pulmonary:     Effort: Pulmonary effort is normal. No respiratory distress.     Breath sounds: Normal breath sounds. No wheezing.  Abdominal:     General: There is no distension.     Palpations: Abdomen is soft.     Tenderness: There is no abdominal tenderness.  Musculoskeletal:        General: No tenderness. Normal range of motion.     Cervical back: Normal range of motion and neck supple.     Comments: Left elbow full range of motion, skin intact, no erythema, no apparent effusion.  Minimal tenderness over lateral epicondyle, minimal discomfort at proximal brachioradialis, extensor carpi ulnaris.  No apparent defect.  Full strength.  Lymphadenopathy:     Cervical: No cervical adenopathy.  Skin:    General: Skin is warm and dry.  Neurological:     Mental Status: He is alert and oriented to person, place, and time.     Deep Tendon Reflexes: Reflexes are normal and symmetric.  Psychiatric:        Behavior: Behavior normal.       Assessment &  Plan:  Brian Reid is a 45 y.o. male . Annual physical exam  - -anticipatory guidance as below in AVS, screening labs above. Health maintenance items as above in HPI discussed/recommended as applicable.   Screening for colon cancer - Plan: Cologuard  Prediabetes - Plan: Comprehensive metabolic panel, Hemoglobin A1c  -Plan for increasing exercise, watching diet.  Less adherent past few months.  May need to recheck in 3 to 6 months as planned improvements .  Check initial labs today.  Hypertriglyceridemia - Plan: Comprehensive metabolic panel, Lipid panel  -As above, with recent weight gain anticipate numbers are elevated, but plans on making some changes.  Baseline labs today, recheck 3 to 6 months  Epistaxis - Plan: CBC with Differential/Platelet  -Intermittent after exercise, suspect this is due to dry nasal passages, saline nasal spray discussed, then ENT eval if not improving.  CBC obtained but anticipate will be normal. Left elbow pain - Plan: meloxicam (MOBIC) 7.5 MG tablet  Snores - Plan: Ambulatory referral to Sleep Studies Elevated blood pressure reading - Plan: Ambulatory referral to Sleep Studies  -Some weight gain as above, obesity, unfortunately did not have previous home sleep test.  Referral placed for repeat testing.  Slight elevated blood pressure could certainly be related to OSA.  Hold on meds for now.  Occipital neuralgia of left side - Plan: meloxicam (MOBIC) 7.5 MG tablet -Overall stable with intermittent meloxicam, continue same, but if increased symptoms would consider nerve block.  Meds ordered this encounter  Medications   meloxicam (MOBIC) 7.5 MG tablet    Sig: Take 1 tablet (7.5 mg total) by mouth daily. As needed.    Dispense:  30 tablet    Refill:  0   Patient Instructions  I do recommend dental visit every 6 months.   Arm pain still may be some tennis elbow and possibly other  inflamed muscle from recent move.  Okay to try using the tennis elbow splint,  stretches and range of motion, avoid repetitive use of left arm, occasional meloxicam if needed only and recheck in 1 month.   I will place another referral to sleep specialist as home sleep test may be helpful to rule out sleep apnea.  That can sometimes contribute to snoring, as well as elevated blood pressure.  No new medications for now.  I will check your labs, but I expect this to improve as diet and exercise improves.  We can certainly recheck those again in the next 3 to 6 months.   Meloxicam if needed for headache, but follow-up with neurologist if more persistent symptoms.   Try saline nasal spray whenever your nose feels dry or after exercise to see if that lessens nosebleeds.  If those continue I would like you to meet with ear nose and throat specialist.  Thanks for coming in today.  Recheck in 1 month.  Return to the clinic or go to the nearest emergency room if any of your symptoms worsen or new symptoms occur.  Preventive Care 11-41 Years Old, Male Preventive care refers to lifestyle choices and visits with your health care provider that can promote health and wellness. This includes: A yearly physical exam. This is also called an annual wellness visit. Regular dental and eye exams. Immunizations. Screening for certain conditions. Healthy lifestyle choices, such as: Eating a healthy diet. Getting regular exercise. Not using drugs or products that contain nicotine and tobacco. Limiting alcohol use. What can I expect for my preventive care visit? Physical exam Your health care provider will check your: Height and weight. These may be used to calculate your BMI (body mass index). BMI is a measurement that tells if you are at a healthy weight. Heart rate and blood pressure. Body temperature. Skin for abnormal spots. Counseling Your health care provider may ask you questions about your: Past medical problems. Family's medical history. Alcohol, tobacco, and drug  use. Emotional well-being. Home life and relationship well-being. Sexual activity. Diet, exercise, and sleep habits. Work and work Statistician. Access to firearms. What immunizations do I need?  Vaccines are usually given at various ages, according to a schedule. Your health care provider will recommend vaccines for you based on your age, medicalhistory, and lifestyle or other factors, such as travel or where you work. What tests do I need? Blood tests Lipid and cholesterol levels. These may be checked every 5 years, or more often if you are over 13 years old. Hepatitis C test. Hepatitis B test. Screening Lung cancer screening. You may have this screening every year starting at age 62 if you have a 30-pack-year history of smoking and currently smoke or have quit within the past 15 years. Prostate cancer screening. Recommendations will vary depending on your family history and other risks. Genital exam to check for testicular cancer or hernias. Colorectal cancer screening. All adults should have this screening starting at age 54 and continuing until age 76. Your health care provider may recommend screening at age 22 if you are at increased risk. You will have tests every 1-10 years, depending on your results and the type of screening test. Diabetes screening. This is done by checking your blood sugar (glucose) after you have not eaten for a while (fasting). You may have this done every 1-3 years. STD (sexually transmitted disease) testing, if you are at risk. Follow these instructions at home: Eating and drinking  Eat  a diet that includes fresh fruits and vegetables, whole grains, lean protein, and low-fat dairy products. Take vitamin and mineral supplements as recommended by your health care provider. Do not drink alcohol if your health care provider tells you not to drink. If you drink alcohol: Limit how much you have to 0-2 drinks a day. Be aware of how much alcohol is in your  drink. In the [place] one drink equals one 12 oz bottle of beer (355 mL), one 5 oz glass of wine (148 mL), or one 1 oz glass of hard liquor (44 mL).  Lifestyle Take daily care of your teeth and gums. Brush your teeth every morning and night with fluoride toothpaste. Floss one time each day. Stay active. Exercise for at least 30 minutes 5 or more days each week. Do not use any products that contain nicotine or tobacco, such as cigarettes, e-cigarettes, and chewing tobacco. If you need help quitting, ask your health care provider. Do not use drugs. If you are sexually active, practice safe sex. Use a condom or other form of protection to prevent STIs (sexually transmitted infections). If told by your health care provider, take low-dose aspirin daily starting at age 57. Find healthy ways to cope with stress, such as: Meditation, yoga, or listening to music. Journaling. Talking to a trusted person. Spending time with friends and family. Safety Always wear your seat belt while driving or riding in a vehicle. Do not drive: If you have been drinking alcohol. Do not ride with someone who has been drinking. When you are tired or distracted. While texting. Wear a helmet and other protective equipment during sports activities. If you have firearms in your house, make sure you follow all gun safety procedures. What's next? Go to your health care provider once a year for an annual wellness visit. Ask your health care provider how often you should have your eyes and teeth checked. Stay up to date on all vaccines. This information is not intended to replace advice given to you by your health care provider. Make sure you discuss any questions you have with your healthcare provider. Document Revised: 04/19/2019 Document Reviewed: 07/15/2018 Elsevier Patient Education  2022 [place], Adult A nosebleed is when blood comes out of the nose. Nosebleeds are common.Usually, they are not a sign  of a serious condition. Nosebleeds can happen if a blood vessel in your nose starts to bleed or if the lining of your nose (mucous membrane) cracks. They are commonly caused by: Allergies. Colds. Picking your nose. Blowing your nose too hard. An injury from sticking an object into your nose or getting hit in the nose. Dry or cold air. Less common causes of nosebleeds include: Toxic fumes. Something abnormal in the nose or in the air-filled spaces in the bones of the face (sinuses). Growths in the nose, such as polyps. Blood thinners or conditions that cause blood to clot slowly. Certain illnesses or procedures that irritate or dry out the nasal passages. Follow these instructions at home: When you have a nosebleed:  Sit down and tilt your head slightly forward. Use a clean towel or tissue to pinch your nostrils under the bony part of your nose. After 5 minutes, let go of your nose and see if bleeding starts again. Do not release pressure before that time. If there is still bleeding, repeat the pinching and holding for 5 minutes or until the bleeding stops. Do not place tissues or gauze in the nose to stop the bleeding.  Avoid lying down and avoid tilting your head backward. That may make blood collect in the throat and cause gagging or coughing. Use a nasal spray decongestant to help with a nosebleed as told by your health care provider.  After a nosebleed: Avoid blowing your nose or sniffing for a number of hours. Avoid straining, lifting, or bending at the waist for several days. You may go back to other normal activities as you are able. If you are taking aspirin or blood thinners and you have nosebleeds, talk to your health care provider. These medicines make bleeding more likely. Ask your health care provider if you should stop taking the medicines or if you should adjust the dose. Do not stop taking medicines that your health care provider has recommended unless he or she tells you  to stop taking them. If your nosebleed was caused by dry mucous membranes, use over-the-counter saline nasal spray or gel and a humidifier as told by your health care provider. This will keep the mucous membranes moist and allow them to heal. If you need to use one of these products: Choose one that is water-soluble. Use only as much as you need and use it only as often as needed. Do not lie down right after you use it. If you get nosebleeds often, talk with your health care provider about medical treatments. Options may include: Nasal cautery. This treatment stops and prevents nosebleeds by using a chemical swab or electrical device to lightly burn tiny blood vessels inside the nose. Nasal packing. A gauze or other material is placed in the nose to keep constant pressure on the bleeding area. Contact a health care provider if you: Have a fever. Get nosebleeds often or more often than usual. Bruise very easily. Have a nosebleed from having something stuck in your nose. Have bleeding in your mouth. Vomit or cough up brown material. Have a nosebleed after you start a new medicine. Get help right away if: You have a nosebleed after a fall or a head injury. Your nosebleed does not go away after 20 minutes. You feel dizzy or weak. You have unusual bleeding from other parts of your body. You have unusual bruising on other parts of your body. You become sweaty. You vomit blood. Summary A nosebleed is when blood comes out of the nose. Common causes include allergies, an injury to the nose, or cold or dry air. Initial treatment includes applying pressure for 5 minutes. Moisturizing the nose with saline nasal spray or gel after a nosebleed may help prevent future bleeding. Get help right away if your nosebleed does not go away after 20 minutes. This information is not intended to replace advice given to you by your health care provider. Make sure you discuss any questions you have with your  healthcare provider. Document Revised: 05/19/2019 Document Reviewed: 05/19/2019 Elsevier Patient Education  2022 [place],   Merri Ray, MD [place] 03/13/21 9:24 AM

## 2021-03-13 NOTE — Patient Instructions (Addendum)
I do recommend dental visit every 6 months.   Arm pain still may be some tennis elbow and possibly other inflamed muscle from recent move.  Okay to try using the tennis elbow splint, stretches and range of motion, avoid repetitive use of left arm, occasional meloxicam if needed only and recheck in 1 month.   I will place another referral to sleep specialist as home sleep test may be helpful to rule out sleep apnea.  That can sometimes contribute to snoring, as well as elevated blood pressure.  No new medications for now.  I will check your labs, but I expect this to improve as diet and exercise improves.  We can certainly recheck those again in the next 3 to 6 months.   Meloxicam if needed for headache, but follow-up with neurologist if more persistent symptoms.   Try saline nasal spray whenever your nose feels dry or after exercise to see if that lessens nosebleeds.  If those continue I would like you to meet with ear nose and throat specialist.  Thanks for coming in today.  Recheck in 1 month.  Return to the clinic or go to the nearest emergency room if any of your symptoms worsen or new symptoms occur.  Preventive Care 45-12 Years Old, Male Preventive care refers to lifestyle choices and visits with your health care provider that can promote health and wellness. This includes: A yearly physical exam. This is also called an annual wellness visit. Regular dental and eye exams. Immunizations. Screening for certain conditions. Healthy lifestyle choices, such as: Eating a healthy diet. Getting regular exercise. Not using drugs or products that contain nicotine and tobacco. Limiting alcohol use. What can I expect for my preventive care visit? Physical exam Your health care provider will check your: Height and weight. These may be used to calculate your BMI (body mass index). BMI is a measurement that tells if you are at a healthy weight. Heart rate and blood pressure. Body  temperature. Skin for abnormal spots. Counseling Your health care provider may ask you questions about your: Past medical problems. Family's medical history. Alcohol, tobacco, and drug use. Emotional well-being. Home life and relationship well-being. Sexual activity. Diet, exercise, and sleep habits. Work and work Statistician. Access to firearms. What immunizations do I need?  Vaccines are usually given at various ages, according to a schedule. Your health care provider will recommend vaccines for you based on your age, medicalhistory, and lifestyle or other factors, such as travel or where you work. What tests do I need? Blood tests Lipid and cholesterol levels. These may be checked every 5 years, or more often if you are over 45 years old. Hepatitis C test. Hepatitis B test. Screening Lung cancer screening. You may have this screening every year starting at age 45 if you have a 30-pack-year history of smoking and currently smoke or have quit within the past 15 years. Prostate cancer screening. Recommendations will vary depending on your family history and other risks. Genital exam to check for testicular cancer or hernias. Colorectal cancer screening. All adults should have this screening starting at age 45 and continuing until age 49. Your health care provider may recommend screening at age 21 if you are at increased risk. You will have tests every 1-10 years, depending on your results and the type of screening test. Diabetes screening. This is done by checking your blood sugar (glucose) after you have not eaten for a while (fasting). You may have this done every 1-3 years.  STD (sexually transmitted disease) testing, if you are at risk. Follow these instructions at home: Eating and drinking  Eat a diet that includes fresh fruits and vegetables, whole grains, lean protein, and low-fat dairy products. Take vitamin and mineral supplements as recommended by your health care  provider. Do not drink alcohol if your health care provider tells you not to drink. If you drink alcohol: Limit how much you have to 0-2 drinks a day. Be aware of how much alcohol is in your drink. In the [place] one drink equals one 12 oz bottle of beer (355 mL), one 5 oz glass of wine (148 mL), or one 1 oz glass of hard liquor (44 mL).  Lifestyle Take daily care of your teeth and gums. Brush your teeth every morning and night with fluoride toothpaste. Floss one time each day. Stay active. Exercise for at least 30 minutes 5 or more days each week. Do not use any products that contain nicotine or tobacco, such as cigarettes, e-cigarettes, and chewing tobacco. If you need help quitting, ask your health care provider. Do not use drugs. If you are sexually active, practice safe sex. Use a condom or other form of protection to prevent STIs (sexually transmitted infections). If told by your health care provider, take low-dose aspirin daily starting at age 39. Find healthy ways to cope with stress, such as: Meditation, yoga, or listening to music. Journaling. Talking to a trusted person. Spending time with friends and family. Safety Always wear your seat belt while driving or riding in a vehicle. Do not drive: If you have been drinking alcohol. Do not ride with someone who has been drinking. When you are tired or distracted. While texting. Wear a helmet and other protective equipment during sports activities. If you have firearms in your house, make sure you follow all gun safety procedures. What's next? Go to your health care provider once a year for an annual wellness visit. Ask your health care provider how often you should have your eyes and teeth checked. Stay up to date on all vaccines. This information is not intended to replace advice given to you by your health care provider. Make sure you discuss any questions you have with your healthcare provider. Document Revised: 04/19/2019  Document Reviewed: 07/15/2018 Elsevier Patient Education  2022 [place], Adult A nosebleed is when blood comes out of the nose. Nosebleeds are common.Usually, they are not a sign of a serious condition. Nosebleeds can happen if a blood vessel in your nose starts to bleed or if the lining of your nose (mucous membrane) cracks. They are commonly caused by: Allergies. Colds. Picking your nose. Blowing your nose too hard. An injury from sticking an object into your nose or getting hit in the nose. Dry or cold air. Less common causes of nosebleeds include: Toxic fumes. Something abnormal in the nose or in the air-filled spaces in the bones of the face (sinuses). Growths in the nose, such as polyps. Blood thinners or conditions that cause blood to clot slowly. Certain illnesses or procedures that irritate or dry out the nasal passages. Follow these instructions at home: When you have a nosebleed:  Sit down and tilt your head slightly forward. Use a clean towel or tissue to pinch your nostrils under the bony part of your nose. After 5 minutes, let go of your nose and see if bleeding starts again. Do not release pressure before that time. If there is still bleeding, repeat the pinching and holding for  5 minutes or until the bleeding stops. Do not place tissues or gauze in the nose to stop the bleeding. Avoid lying down and avoid tilting your head backward. That may make blood collect in the throat and cause gagging or coughing. Use a nasal spray decongestant to help with a nosebleed as told by your health care provider.  After a nosebleed: Avoid blowing your nose or sniffing for a number of hours. Avoid straining, lifting, or bending at the waist for several days. You may go back to other normal activities as you are able. If you are taking aspirin or blood thinners and you have nosebleeds, talk to your health care provider. These medicines make bleeding more likely. Ask your  health care provider if you should stop taking the medicines or if you should adjust the dose. Do not stop taking medicines that your health care provider has recommended unless he or she tells you to stop taking them. If your nosebleed was caused by dry mucous membranes, use over-the-counter saline nasal spray or gel and a humidifier as told by your health care provider. This will keep the mucous membranes moist and allow them to heal. If you need to use one of these products: Choose one that is water-soluble. Use only as much as you need and use it only as often as needed. Do not lie down right after you use it. If you get nosebleeds often, talk with your health care provider about medical treatments. Options may include: Nasal cautery. This treatment stops and prevents nosebleeds by using a chemical swab or electrical device to lightly burn tiny blood vessels inside the nose. Nasal packing. A gauze or other material is placed in the nose to keep constant pressure on the bleeding area. Contact a health care provider if you: Have a fever. Get nosebleeds often or more often than usual. Bruise very easily. Have a nosebleed from having something stuck in your nose. Have bleeding in your mouth. Vomit or cough up brown material. Have a nosebleed after you start a new medicine. Get help right away if: You have a nosebleed after a fall or a head injury. Your nosebleed does not go away after 20 minutes. You feel dizzy or weak. You have unusual bleeding from other parts of your body. You have unusual bruising on other parts of your body. You become sweaty. You vomit blood. Summary A nosebleed is when blood comes out of the nose. Common causes include allergies, an injury to the nose, or cold or dry air. Initial treatment includes applying pressure for 5 minutes. Moisturizing the nose with saline nasal spray or gel after a nosebleed may help prevent future bleeding. Get help right away if your  nosebleed does not go away after 20 minutes. This information is not intended to replace advice given to you by your health care provider. Make sure you discuss any questions you have with your healthcare provider. Document Revised: 05/19/2019 Document Reviewed: 05/19/2019 Elsevier Patient Education  2022 [employer].

## 2021-03-29 LAB — COLOGUARD: Cologuard: NEGATIVE

## 2021-04-01 ENCOUNTER — Ambulatory Visit: Payer: Managed Care, Other (non HMO) | Admitting: Family Medicine

## 2021-04-01 ENCOUNTER — Encounter: Payer: Self-pay | Admitting: Family Medicine

## 2021-04-01 ENCOUNTER — Other Ambulatory Visit: Payer: Self-pay

## 2021-04-01 VITALS — BP 134/88 | HR 70 | Temp 98.0°F | Resp 17 | Ht 70.0 in | Wt 242.6 lb

## 2021-04-01 DIAGNOSIS — M79632 Pain in left forearm: Secondary | ICD-10-CM

## 2021-04-01 DIAGNOSIS — G5612 Other lesions of median nerve, left upper limb: Secondary | ICD-10-CM | POA: Diagnosis not present

## 2021-04-01 DIAGNOSIS — E781 Pure hyperglyceridemia: Secondary | ICD-10-CM | POA: Diagnosis not present

## 2021-04-01 DIAGNOSIS — E119 Type 2 diabetes mellitus without complications: Secondary | ICD-10-CM

## 2021-04-01 NOTE — Progress Notes (Signed)
Subjective:  Patient ID: Brian Reid, male    DOB: [birth year]  Age: 45 y.o. MRN: [id number]  CC:  Chief Complaint  Patient presents with   Prediabetes    Pt lab work had worsesned here to review and discuss medications    Arm Pain    Pt reports Lt lower arm pain, denies injury, sometimes aches other times has been sharp, has been present since last OV     HPI Brian Reid presents for   Diabetes: Previous prediabetes, most recent A1c at diabetic level.  Weight had increased from 2 35-2 42 since April.  He had been traveling more the previous few months including more fast food/takeout/restaurant food as well as soda/sugar containing beverages.  He feels like some changes are occurring - able to be in more routine. Kids back in school - able to exercise now. Will be able to make some diet changes, defer meds for now.  Lab Results  Component Value Date   HGBA1C 7.6 (H) 03/13/2021   HGBA1C 6.2 (H) 10/12/2020   HGBA1C 6.0 (H) 02/26/2016   Lab Results  Component Value Date   LDLCALC 161 (H) 10/12/2020   CREATININE 1.02 03/13/2021   Hyperlipidemia: 10-year ASCVD risk score 3.8%.  No current statin. Lab Results  Component Value Date   CHOL 212 (H) 03/13/2021   HDL 35.40 (L) 03/13/2021   LDLCALC 161 (H) 10/12/2020   LDLDIRECT 129.0 03/13/2021   TRIG 318.0 (H) 03/13/2021   CHOLHDL 6 03/13/2021   Lab Results  Component Value Date   ALT 40 03/13/2021   AST 24 03/13/2021   ALKPHOS 46 03/13/2021   BILITOT 0.8 03/13/2021   Left arm pain Discussed left elbow pain at his physical on August 10.  Felt to be deeper than previous tennis elbow symptoms.  Had been moving to a new house the previous few months.  Had tennis elbow brace from previous issue but was not using at that time. Tried tennis elbow brace - no relief. Seems to be more mid forearm now.  No tingling/ no weakness. Pain in mid forearm to bend of elbow -  more with pronation.  Tx: none  - did not use meloxicam.  Pain  about the same.  Computer work. R hand uses mouse.  NKI, no fall.  R hand dominant.  History Patient Active Problem List   Diagnosis Date Noted   Acute appendicitis s/p lap appendectomy 04/02/2016 04/03/2016   No past medical history on file. Past Surgical History:  Procedure Laterality Date   APPENDECTOMY N/A    [facility] 06/27/2020   LAPAROSCOPIC APPENDECTOMY N/A 04/02/2016   Procedure: APPENDECTOMY LAPAROSCOPIC;  Surgeon: Autumn Messing III, MD;  Location: [facility];  Service: General;  Laterality: N/A;   LEG SURGERY     age 60   OPEN REDUCTION SHOULDER DISLOCATION     age 26   No Known Allergies Prior to Admission medications   Medication Sig Start Date End Date Taking? Authorizing Provider  meloxicam (MOBIC) 7.5 MG tablet Take 1 tablet (7.5 mg total) by mouth daily. As needed. 03/13/21  Yes Wendie Agreste, MD   Social History   Socioeconomic History   Marital status: Married    Spouse name: Deneise Lever   Number of children: 2   Years of education: post grad   Highest education level: Not on file  Occupational History    Comment: IT professional  Tobacco Use   Smoking status: Former   Smokeless tobacco: Never  Tobacco comments:    03/05/16 "very occasional now", 06/06/16 not smoking  Vaping Use   Vaping Use: Never used  Substance and Sexual Activity   Alcohol use: Yes    Alcohol/week: 2.0 standard drinks    Types: 2 Standard drinks or equivalent per week    Comment: social   Drug use: No   Sexual activity: Yes  Other Topics Concern   Not on file  Social History Narrative   Lives with family   Drinks 1 cup of coffee a day    Social Determinants of Health   Financial Resource Strain: Not on file  Food Insecurity: Not on file  Transportation Needs: Not on file  Physical Activity: Not on file  Stress: Not on file  Social Connections: Not on file  Intimate Partner Violence: Not on file    Review of Systems Per HPI.   Objective:   Vitals:   04/01/21 1510  BP:  134/88  Pulse: 70  Resp: 17  Temp: 98 F (36.7 C)  TempSrc: Temporal  SpO2: 95%  Weight: 242 lb 9.6 oz (110 kg)  Height: 5' 10"  (1.778 m)     Physical Exam Constitutional:      General: He is not in acute distress.    Appearance: Normal appearance. He is well-developed.  HENT:     Head: Normocephalic and atraumatic.  Cardiovascular:     Rate and Rhythm: Normal rate.  Pulmonary:     Effort: Pulmonary effort is normal.  Musculoskeletal:     Comments: Left elbow, no bony tenderness, full range of motion.  Left wrist full range of motion no bony tenderness. Left forearm, ECRB, lateral epicondyle nontender, no pain with resisted middle phalanx extension or flexion. Area of discomfort at the antecubital fossa, slightly distal, medial to the pronator teres.  Discomfort with palpation and persistent palpation with radiation towards wrist, but no dysesthesias into the hand/median distribution.  Negative Tinel's at site.  Neurological:     Mental Status: He is alert and oriented to person, place, and time.  Psychiatric:        Mood and Affect: Mood normal.       Assessment & Plan:  Brian Reid is a 45 y.o. male . Left forearm pain - Plan: Ambulatory referral to Hand Surgery Pronator syndrome of left upper extremity - Plan: Ambulatory referral to Hand Surgery  -Location, possible pronator syndrome.  Refer to hand specialist, has meloxicam at home, recommended initial trial.  Diabetes mellitus without complication ([place])  -Medication discussed as option but he would like to make some significant changes in diet, activity/exercise and recheck in 6 weeks, consider fructosamine at that time.  Hypertriglyceridemia  -Likely in part due to hyperglycemia.  Discussed statin recommendation with diabetes, but would like to hold on meds for now with attempts at reversing A1c back to prediabetes range.  No orders of the defined types were placed in this encounter.  Patient Instructions  See  info on diabetes and diet. Walking or other low intensity exercise most days per week with goal of 141mn per week. Recheck in 6 weeks to decide on meds (can check fructosamine diabetes blood test at that time). Triglycerides should also improve as blood sugar decreases. If levels remain at diabetes range, we can discuss a statin medicine at next visit.   Pain may be due to a condition called pronator syndrome or a pinching of the median nerve.  Try the meloxicam once per day temporarily and I will also  refer you to a hand specialist.  Try to avoid any repetitive activities that make pain worse.  Relative rest of the elbow and forearm when able.  Return to the clinic or go to the nearest emergency room if any of your symptoms worsen or new symptoms occur.    Signed,   Merri Ray, MD [place] 04/01/21 10:37 PM

## 2021-04-01 NOTE — Patient Instructions (Addendum)
See info on diabetes and diet. Walking or other low intensity exercise most days per week with goal of 13mn per week. Recheck in 6 weeks to decide on meds (can check fructosamine diabetes blood test at that time). Triglycerides should also improve as blood sugar decreases. If levels remain at diabetes range, we can discuss a statin medicine at next visit.   Pain may be due to a condition called pronator syndrome or a pinching of the median nerve.  Try the meloxicam once per day temporarily and I will also refer you to a hand specialist.  Try to avoid any repetitive activities that make pain worse.  Relative rest of the elbow and forearm when able.  Return to the clinic or go to the nearest emergency room if any of your symptoms worsen or new symptoms occur.

## 2021-04-12 ENCOUNTER — Ambulatory Visit (INDEPENDENT_AMBULATORY_CARE_PROVIDER_SITE_OTHER): Payer: Managed Care, Other (non HMO) | Admitting: Orthopedic Surgery

## 2021-04-12 ENCOUNTER — Other Ambulatory Visit: Payer: Self-pay

## 2021-04-12 VITALS — BP 143/99 | HR 74 | Ht 70.0 in | Wt 240.0 lb

## 2021-04-12 DIAGNOSIS — M79632 Pain in left forearm: Secondary | ICD-10-CM

## 2021-04-12 NOTE — Progress Notes (Signed)
Office Visit Note   Patient: Brian Reid           Date of Birth: [birth year]           MRN: [id number] Visit Date: 04/12/2021              Requested by: Wendie Agreste, MD [id number] [place],  [place] [address] PCP: Wendie Agreste, MD   Assessment & Plan: Visit Diagnoses:  1. Left forearm pain     Plan: Discussed with patient that he likely has either left bicipital tendinitis versus pronator syndrome.  Most of his pain is with resisted supination which would favor insertional biceps tendinitis.  Either way, we discussed the for step in treatment being conservative.  We will have him start taking meloxicam as an anti-inflammatory.  We will also refer him to occupational therapy to work on strengthening and maybe fabricate a functional brace.  We will also have him do some activity modification to limit use of this arm particular with heavy lifting and pressure and supination.  We can see him back in 6 to 8 weeks to see if he is improved with therapy. Follow-Up Instructions: No follow-ups on file.   Orders:  No orders of the defined types were placed in this encounter.  No orders of the defined types were placed in this encounter.     Procedures: No procedures performed   Clinical Data: No additional findings.   Subjective: No chief complaint on file.   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  This started about a month ago.  He does not remember any particular injury but does think he was moving a lot of packages around that time.  He did not feel a pop or a tearing sensation.  Since that time he has experienced pain going from the distal brachium across the [facility] fossa and the proximal forearm.  This pain is worse with lifting and pronation supination.  He has not taken any NSAIDs for the last month or so.  He denies any numbness or tingling in his hand.  He wore an elbow strap for previous lateral  epicondylitis which has improved somewhat although he still has some pain over the lateral epicondyle.  He has not done any occupational or hand therapy at this point.   Review of Systems  Constitutional: Negative.   Respiratory: Negative.    Cardiovascular: Negative.   Musculoskeletal:  Positive for joint swelling.  Skin: Negative.   Neurological: Negative.     Objective: Vital Signs: BP (!) 143/99   Pulse 74   Ht 5' 10"  (1.778 m)   Wt 240 lb (108.9 kg)   SpO2 98%   BMI 34.44 kg/m   Physical Exam Constitutional:      Appearance: He is normal weight.  Cardiovascular:     Rate and Rhythm: Normal rate.     Pulses: Normal pulses.  Skin:    General: Skin is warm and dry.     Capillary Refill: Capillary refill takes less than 2 seconds.  Neurological:     General: No focal deficit present.     Mental Status: He is alert.    Left Hand Exam   Tenderness  Left hand tenderness location: TTP over lateral epicondyle, distal brachium, radial proximal forearm.   Range of Motion  The patient has normal left wrist ROM.  Muscle Strength  Wrist extension: 5/5  Wrist flexion: 5/5  Grip:  5/5   Other  Erythema: absent Sensation: normal Pulse: present  Comments:  Negative Tinel at proximal forearm over median nerve.  Pain w/ resisted supination.  No pain w/ resisted pronation.  Full prosupination w/ 5/5 strength.  SILT throughout hand.      Specialty Comments:  No specialty comments available.  Imaging: No results found.   PMFS History: Patient Active Problem List   Diagnosis Date Noted   Left forearm pain 04/12/2021   Acute appendicitis s/p lap appendectomy 04/02/2016 04/03/2016   No past medical history on file.  Family History  Problem Relation Age of Onset   Diabetes Mother    Hypertension Mother    Stroke Mother    Diabetes Father    Heart disease Father    Hypertension Sister     Past Surgical History:  Procedure Laterality Date   APPENDECTOMY N/A     [facility] 06/27/2020   LAPAROSCOPIC APPENDECTOMY N/A 04/02/2016   Procedure: APPENDECTOMY LAPAROSCOPIC;  Surgeon: Autumn Messing III, MD;  Location: [facility];  Service: General;  Laterality: N/A;   LEG SURGERY     age 69   OPEN REDUCTION SHOULDER DISLOCATION     age 32   Social History   Occupational History    Comment: IT professional  Tobacco Use   Smoking status: Former   Smokeless tobacco: Never   Tobacco comments:    03/05/16 "very occasional now", 06/06/16 not smoking  Vaping Use   Vaping Use: Never used  Substance and Sexual Activity   Alcohol use: Yes    Alcohol/week: 2.0 standard drinks    Types: 2 Standard drinks or equivalent per week    Comment: social   Drug use: No   Sexual activity: Yes

## 2021-04-25 ENCOUNTER — Encounter: Payer: Self-pay | Admitting: Occupational Therapy

## 2021-04-25 ENCOUNTER — Ambulatory Visit (INDEPENDENT_AMBULATORY_CARE_PROVIDER_SITE_OTHER): Payer: Managed Care, Other (non HMO) | Admitting: Occupational Therapy

## 2021-04-25 ENCOUNTER — Other Ambulatory Visit: Payer: Self-pay

## 2021-04-25 DIAGNOSIS — M79602 Pain in left arm: Secondary | ICD-10-CM

## 2021-04-25 DIAGNOSIS — M79642 Pain in left hand: Secondary | ICD-10-CM

## 2021-04-25 DIAGNOSIS — M6281 Muscle weakness (generalized): Secondary | ICD-10-CM | POA: Diagnosis not present

## 2021-04-25 DIAGNOSIS — M25622 Stiffness of left elbow, not elsewhere classified: Secondary | ICD-10-CM

## 2021-04-25 DIAGNOSIS — M25512 Pain in left shoulder: Secondary | ICD-10-CM

## 2021-04-25 DIAGNOSIS — R601 Generalized edema: Secondary | ICD-10-CM

## 2021-04-25 NOTE — Therapy (Signed)
[facility] [address] [place], [place], [address] Phone: [phone]   Fax:  [phone]  Occupational Therapy Evaluation  Patient Details  Name: Brian Reid MRN: [id number] Date of Birth: [birth year] Referring Provider (OT): Dr Tempie Donning   Encounter Date: 04/25/2021   OT End of Session - 04/25/21 0957     Visit Number 1    Number of Visits 25   Eval + up to 24 visits   Date for OT Re-Evaluation 05/30/21   Every 10 visits   Authorization Type [employer] Managed    Authorization - Visit Number 1    OT Start Time 0846    OT Stop Time 0928    OT Time Calculation (min) 42 min    Activity Tolerance Patient tolerated treatment well    Behavior During Therapy [facility] for tasks assessed/performed             Past Medical History:  Diagnosis Date   Asthma     Past Surgical History:  Procedure Laterality Date   APPENDECTOMY N/A    [employer]rees[employer] 06/27/2020   LAPAROSCOPIC APPENDECTOMY N/A 04/02/2016   Procedure: APPENDECTOMY LAPAROSCOPIC;  Surgeon: Autumn Messing III, MD;  Location: [facility];  Service: General;  Laterality: N/A;   LEG SURGERY     age 45   OPEN REDUCTION SHOULDER DISLOCATION     age 45    There were no vitals filed for this visit.   Subjective Assessment - 04/25/21 0849     Subjective  Pt presents with left distal biceps pain in elbow. Initially used lateral epicondylitis band. Over last 6 weeks pt has experienced pain in anterior elbow that can sometimes extend to his shoulder as well as his forearm.    Pertinent History DM, Hyperlipidemia, HTN    Patient Stated Goals Use left hand again without pain and symptoms.    Currently in Pain? Yes    Pain Score 3    At rest = 3, with movement increases to 9-10 per pt report   Pain Location Elbow    Pain Orientation Left    Pain Descriptors / Indicators Aching;Sharp;Sore    Pain Type Acute pain    Pain Radiating Towards From anterior elbow to Shoulder and forearm left UE    Pain Onset More  than a month ago    Pain Frequency Constant    Aggravating Factors  Movement, functional use, forearm supination, lifting etc.    Pain Relieving Factors Rest and non-use    Multiple Pain Sites No               [facility] OT Assessment - 04/25/21 0001       Assessment   Medical Diagnosis Left distal biceps tendonitis vs pronator syndrome    Referring Provider (OT) Dr Tempie Donning    Onset Date/Surgical Date 04/03/21   Onset of symptoms was about 6-7 weeks ago per pt report   Hand Dominance Right    Next MD Visit In 6-8 weeks   from 04/12/21     Precautions   Precautions Other (comment)   Rest L UE, don't workout per pt report   Required Braces or Orthoses --   Pt sometimes uses lateral Epicondylitis band, has not used lately.     Balance Screen   Has the patient fallen in the past 6 months No    Has the patient had a decrease in activity level because of a fear of falling?  No    Is the patient reluctant  to leave their home because of a fear of falling?  No      Home  Environment   Family/patient expects to be discharged to: Private residence    Lives With --   Pt asks for help from family for lifting items at home per his report     Prior Function   Level of Independence Independent    Vocation Full time employment   IT   Vocation Requirements Works in [employer] Working out, physical activity- yard work.      ADL   ADL comments Pt reports pain while taking a shower but reports that he is overall Mod I. Difficulty using left UE/hand      Mobility   Mobility Status Independent      Written Expression   Dominant Hand Right      Cognition   Overall Cognitive Status Within Functional Limits for tasks assessed      Observation/Other Assessments   Observations Pt reports that he is not taking anti-inflammatory medication as prescribed for L UE. Pt indicates that he is painful with forearm supination and elbow flexion. Pt is positive for pain at left distal biceps of elbow. Pt  indicates that pain occasionally radiates up his arm to his shoulder (indicating his biceps insertion at his shoulder).      Sensation   Light Touch Appears Intact    Additional Comments Pt is negative for paresthesias. Light touch is WNL's      Coordination   Gross Motor Movements are Fluid and Coordinated Yes   With pain in elbow/shoulder noted. Pt educated multiple times to avoid painful motion and decrease functional use. Specific motion was discussed in clinic. Pt verbalized understanding.   Fine Motor Movements are Fluid and Coordinated Not tested    Coordination NT      Edema   Edema No - none observed when comparing R non-affected UE to L in clinic      ROM / Strength   AROM / PROM / Strength Strength;AROM   Left wrist ROM is WNL's. Strength is 5/5 for grip, wrist flexion & extension noted.     Palpation   Palpation comment Pt was + Hook test L UE   Pt is whit noted pain with resisted forearm supination > resisted forearm pronation. Pt with c/o soreness with resisted prenation Left but denies pain. A/ROM is WNL's L UE     AROM   Overall AROM  Within functional limits for tasks performed   See notes above     Strength   Overall Strength Within functional limits for tasks performed   Gross assessment is 5/5 throughout L UE. See notes above     Hand Function   Right Hand Gross Grasp Functional    Comment 5/5 LUE              OT Education - 04/25/21 0954     Education Details Avoid painful motion, do not use L UE for functional activity, bicep curls, lifting, carrying and overall functional use. Cryotherapy, purchase compression arm sleeve for LUE. Avoid painful motion. Begin gentle stretching HEP as described in clinic today. Decrease any activity that causes increased pain or symptoms. Take NSAIDs as prescribed by your Dr.    Terence Lux) Educated Patient    Methods Explanation;Demonstration;Handout    Comprehension Verbalized understanding;Need further instruction               Keeping elbow straight, grasp left hand and  slowly bend wrist back until stretch is felt. Hold ____ seconds. Relax. Repeat ____ times per set. Do ____ sets per session.    Do the exercise above leaving your forearm relaxed at your side,, turn your palm away from your body, use your right hand to stretch your left wrist back & hold for 5-10 seconds, then relax again. Repeat 5-10 times max. Do this 2-3 times a day.   Decrease reps or times per day with increased pain or symptoms. Avoid using your left arm for activity that involves bending/flexing your elbow, avoid forearm supination and repetitive turing your palm up/down. Don't lift or carry items with that arm and avoid working out until your pain/symptoms subside. Let pain be your guide for this, if it hurts, don't do it.  Try and purchase a forearm/Elbow compression sleeve for your left arm. You can get this at most pharmacies and medical supply stores.   Try and take your medication (NSAID's) as prescribed by Dr Tempie Donning  Use cryotherpy/ice pack for 10-20 min at a time on your forearm to assist in decreasing edema/swelling/pain.    OT Short Term Goals - 04/25/21 1014       OT SHORT TERM GOAL #1   Title Pt will be Mod I initial HEP L UE as seen by ability to perform in clinic w/ 1 vc or less    Time 6    Period Weeks    Status New    Target Date 06/06/21      OT SHORT TERM GOAL #2   Title Pt will be Mod I cryotherapy/edema contorl use, care and precautions for L UE    Time 6    Period Weeks    Status New    Target Date 06/06/21      OT SHORT TERM GOAL #3   Title Pt will be Mod I activity modification for work & home tasks as seen by I ability to state/perform in clinic    Time 6    Period Weeks    Status New    Target Date 06/06/21               OT Long Term Goals - 04/25/21 1021       OT LONG TERM GOAL #1   Title Pt will be Mod I updated HEP for L UE strengthening and functional use    Time 12     Period Weeks    Status New    Target Date 07/18/21      OT LONG TERM GOAL #2   Title Pt will demonstrate decreased pain L UE as seen by pain rating of 3/10 or less with functional activity    Time 12    Period Weeks    Status New    Target Date 07/18/21      OT LONG TERM GOAL #3   Title Pt will be Mod I with ADL's as seen by ability to wash his hair and dress with pain LUE as 3/10 or less    Time 12    Period Weeks    Status New    Target Date 07/18/21      OT LONG TERM GOAL #4   Title 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    Time 12    Period Weeks    Status New    Target Date 07/18/21  Plan - 04/25/21 0958     Clinical Impression Statement Pt is a 45 y/o right HD male whom presents today per Dr Tempie Donning w/ dx distal biceps tendonitis/L UE arm pain. Pt is woks in IT field and also reports that he moved recently. His pain has been going on for last 6-8 weeks. His PMH includes DM, hyperlipidemia, HTN. Pt is positive for pain to palpation and provocative testing to L distal biceps, including resisted forearm supination > pronation, + Hook test, + pain to palpation of biceps, negative for provacative testing for lateral/medial epicondylitis. Pt reports that his pain can radiate down his forearm to his first dorsal compartment and up to his shoulder when he performs a bicep curl motion with his forearm supinated, flexing his elbow to his shoulder. Pt should benefit from out-pt OT/Hand therapy for activity modification, HEP/stretching, pt education, isometric and closed chain exercises, strengthening & compression sleeve use L UE vs splinting. Pt was educted to avoid activity that involves forearm supination and elbow flexion, activity modification. He was educated to perform gentle stretching taking care to use his right non-affected UE to stretch the left. Pt was  also encouraged to use cryotherapy and take his NSAIDs as prescribed by his MD.    OT Occupational Profile and History Problem Focused Assessment - Including review of records relating to presenting problem    Occupational performance deficits (Please refer to evaluation for details): ADL's    Body Structure / Function / Physical Skills ADL;Dexterity;Pain;UE functional use;ROM;Flexibility;Mobility;Decreased knowledge of precautions    Rehab Potential Good    Clinical Decision Making Limited treatment options, no task modification necessary    Comorbidities Affecting Occupational Performance: May have comorbidities impacting occupational performance    Modification or Assistance to Complete Evaluation  No modification of tasks or assist necessary to complete eval    OT Frequency Other (comment)   1-2x/week   OT Duration 8 weeks    OT Treatment/Interventions Self-care/ADL training;Splinting;Ultrasound;Therapeutic exercise;Therapeutic activities;Cryotherapy;Passive range of motion;Manual Therapy;Patient/family education    Plan Check forearm compression sleeve (pt to bring in), activity modification, review home program, cryotherapy, pulsed [place] at 1.0 W/cm2 using 3 Mhz.    Consulted and Agree with Plan of Care Patient             Patient will benefit from skilled therapeutic intervention in order to improve the following deficits and impairments:   Body Structure / Function / Physical Skills: ADL, Dexterity, Pain, UE functional use, ROM, Flexibility, Mobility, Decreased knowledge of precautions       Visit Diagnosis: Pain in left arm - Plan: Ot plan of care cert/re-cert  Muscle weakness (generalized) - Plan: Ot plan of care cert/re-cert  Stiffness of left elbow, not elsewhere classified - Plan: Ot plan of care cert/re-cert  Generalized edema - Plan: Ot plan of care cert/re-cert  Pain in left hand - Plan: Ot plan of care cert/re-cert  Acute pain of left shoulder - Plan: Ot plan of  care cert/re-cert    Problem List Patient Active Problem List   Diagnosis Date Noted   Left forearm pain 04/12/2021   Acute appendicitis s/p lap appendectomy 04/02/2016 04/03/2016    Almyra Deforest, OT/L 04/25/2021, 10:41 AM  [facility] [address] [place], [place], [address] Phone: [phone]   Fax:  [phone]  Name: Brian Reid MRN: [id number] Date of Birth: September 15, 1975

## 2021-04-25 NOTE — Patient Instructions (Addendum)
Wrist Flexor Stretch    Keeping elbow straight, grasp left hand and slowly bend wrist back until stretch is felt. Hold ____ seconds. Relax. Repeat ____ times per set. Do ____ sets per session.    Do the exercise above leaving your forearm relaxed at your side,, turn your palm away from your body, use your right hand to stretch your left wrist back & hold for 5-10 seconds, then relax again. Repeat 5-10 times max. Do this 2-3 times a day.   Decrease reps or times per day with increased pain or symptoms. Avoid using your left arm for activity that involves bending/flexing your elbow, avoid forearm supination and repetitive turing your palm up/down. Don't lift or carry items with that arm and avoid working out until your pain/symptoms subside. Let pain be your guide for this, if it hurts, don't do it.  Try and purchase a forearm/Elbow compression sleeve for your left arm. You can get this at most pharmacies and medical supply stores.   Try and take your medication (NSAID's) as prescribed by Dr Tempie Donning  Use cryotherpy/ice pack for 10-20 min at a time on your forearm to assist in decreasing edema/swelling/pain.

## 2021-05-09 ENCOUNTER — Encounter: Payer: Managed Care, Other (non HMO) | Admitting: Occupational Therapy

## 2021-05-13 ENCOUNTER — Encounter: Payer: Managed Care, Other (non HMO) | Admitting: Family Medicine

## 2021-05-16 ENCOUNTER — Encounter: Payer: Managed Care, Other (non HMO) | Admitting: Occupational Therapy

## 2021-05-17 ENCOUNTER — Ambulatory Visit: Payer: Managed Care, Other (non HMO) | Admitting: Family Medicine

## 2021-05-17 ENCOUNTER — Other Ambulatory Visit: Payer: Self-pay

## 2021-05-17 VITALS — BP 130/72 | HR 62 | Temp 97.9°F | Resp 16 | Ht 70.0 in | Wt 230.0 lb

## 2021-05-17 DIAGNOSIS — R400 Somnolence: Secondary | ICD-10-CM | POA: Diagnosis not present

## 2021-05-17 DIAGNOSIS — E119 Type 2 diabetes mellitus without complications: Secondary | ICD-10-CM

## 2021-05-17 DIAGNOSIS — R252 Cramp and spasm: Secondary | ICD-10-CM

## 2021-05-17 DIAGNOSIS — E781 Pure hyperglyceridemia: Secondary | ICD-10-CM | POA: Diagnosis not present

## 2021-05-17 LAB — GLUCOSE, POCT (MANUAL RESULT ENTRY): POC Glucose: 275 mg/dl — AB (ref 70–99)

## 2021-05-17 LAB — MICROALBUMIN / CREATININE URINE RATIO
Creatinine,U: 183.3 mg/dL
Microalb Creat Ratio: 0.4 mg/g (ref 0.0–30.0)
Microalb, Ur: <0.7 mg/dL (ref 0.0–1.9)

## 2021-05-17 MED ORDER — METFORMIN HCL 500 MG PO TABS: 500.0000 mg | ORAL_TABLET | Freq: Every day | ORAL | 1 refills | Status: DC

## 2021-05-17 NOTE — Progress Notes (Signed)
Subjective:  Patient ID: Brian Reid, male    DOB: 1976/01/29  Age: 45 y.o. MRN: [id number]  CC:  Chief Complaint  Patient presents with   Hyperlipidemia    Pt reports diet changes but no time for exercise at this time.    Diabetes    Pt does note he changed his diet, pt has lost 10 lbs since last OV    Arm Pain    Pt reports he has been following ortho care, notes some improvement he is working on seeing another provider at the office for this for another opinion. No action required     HPI Mclean Moya presents for   Left forearm pain See last visit, refer to orthopedics, now followed by Dr. Tempie Donning, left distal biceps tendinitis.  Occupational Therapy therapy eval September 22.  Some improvement.  Diabetes: Previous prediabetes, A1c increased to diabetic level at 7.6 last visit in August.  Weight had increased with travel dietary indiscretion and sugar containing beverages.  He was making some changes, plan to make diet changes, increased exercise.  Meds were declined.  Since last visit has changed diet - cut back on sweets and portion sizes. Some activity/exercise, but not as much as planned. Sleepy at times in afternoon. Nap at times. No naps if sleeping 7-8 hours.  Sleeping ok at night -6 hrs per night. No wakening. Some snoring.no hx of OSA.   Wt Readings from Last 3 Encounters:  05/17/21 230 lb (104.3 kg)  04/12/21 240 lb (108.9 kg)  04/01/21 242 lb 9.6 oz (110 kg)     Lab Results  Component Value Date   HGBA1C 7.6 (H) 03/13/2021   HGBA1C 6.2 (H) 10/12/2020   HGBA1C 6.0 (H) 02/26/2016   Lab Results  Component Value Date   LDLCALC 161 (H) 10/12/2020   CREATININE 1.02 03/13/2021   Muscle cramps: Abdominal muscles, thighs, calves at times at night. Notices if more active/exercising during the day. Improves with stretch.  Notices cramps more often past 3-4 weeks. Some in past.  Drinking water during day.  Normal CMP except glucose 142 in August.    Hyperlipidemia: No current meds - diet change as above. Weight loss.  Lab Results  Component Value Date   CHOL 212 (H) 03/13/2021   HDL 35.40 (L) 03/13/2021   LDLCALC 161 (H) 10/12/2020   LDLDIRECT 129.0 03/13/2021   TRIG 318.0 (H) 03/13/2021   CHOLHDL 6 03/13/2021   Lab Results  Component Value Date   ALT 40 03/13/2021   AST 24 03/13/2021   ALKPHOS 46 03/13/2021   BILITOT 0.8 03/13/2021      History Patient Active Problem List   Diagnosis Date Noted   Left forearm pain 04/12/2021   Acute appendicitis s/p lap appendectomy 04/02/2016 04/03/2016   Past Medical History:  Diagnosis Date   Asthma    Past Surgical History:  Procedure Laterality Date   APPENDECTOMY N/A    Phreesia 06/27/2020   LAPAROSCOPIC APPENDECTOMY N/A 04/02/2016   Procedure: APPENDECTOMY LAPAROSCOPIC;  Surgeon: Autumn Messing III, MD;  Location: [facility];  Service: General;  Laterality: N/A;   LEG SURGERY     age 3   OPEN REDUCTION SHOULDER DISLOCATION     age 74   No Known Allergies Prior to Admission medications   Medication Sig Start Date End Date Taking? Authorizing Provider  meloxicam (MOBIC) 7.5 MG tablet Take 1 tablet (7.5 mg total) by mouth daily. As needed. Patient not taking: Reported on 04/25/2021 03/13/21  Wendie Agreste, MD   Social History   Socioeconomic History   Marital status: Married    Spouse name: Deneise Lever   Number of children: 2   Years of education: post grad   Highest education level: Not on file  Occupational History    Comment: IT professional  Tobacco Use   Smoking status: Former   Smokeless tobacco: Never   Tobacco comments:    03/05/16 "very occasional now", 06/06/16 not smoking  Vaping Use   Vaping Use: Never used  Substance and Sexual Activity   Alcohol use: Yes    Alcohol/week: 2.0 standard drinks    Types: 2 Standard drinks or equivalent per week    Comment: social   Drug use: No   Sexual activity: Yes  Other Topics Concern   Not on file  Social History  Narrative   Lives with family   Drinks 1 cup of coffee a day    Social Determinants of Health   Financial Resource Strain: Not on file  Food Insecurity: Not on file  Transportation Needs: Not on file  Physical Activity: Not on file  Stress: Not on file  Social Connections: Not on file  Intimate Partner Violence: Not on file    Review of Systems Per HPI.   Objective:   Vitals:   05/17/21 0839  BP: 130/72  Pulse: 62  Resp: 16  Temp: 97.9 F (36.6 C)  TempSrc: Temporal  SpO2: 98%  Weight: 230 lb (104.3 kg)  Height: 5' 10"  (1.778 m)     Physical Exam Vitals reviewed.  Constitutional:      Appearance: He is well-developed.  HENT:     Head: Normocephalic and atraumatic.  Neck:     Vascular: No carotid bruit or JVD.  Cardiovascular:     Rate and Rhythm: Normal rate and regular rhythm.     Heart sounds: Normal heart sounds. No murmur heard. Pulmonary:     Effort: Pulmonary effort is normal.     Breath sounds: Normal breath sounds. No rales.  Musculoskeletal:     Right lower leg: No edema.     Left lower leg: No edema.     Comments: Calves, hamstrings, quads nontender.  Skin:    General: Skin is warm and dry.  Neurological:     Mental Status: He is alert and oriented to person, place, and time.  Psychiatric:        Mood and Affect: Mood normal.     Assessment & Plan:  Aria Pickrell is a 45 y.o. male . Diabetes mellitus without complication ([place]) - Plan: Microalbumin / creatinine urine ratio, POCT glucose (manual entry), Comprehensive metabolic panel, Hemoglobin A1c  -Commended on weight loss.  Unfortunately glucose is higher, noted after his visit.  Called with these results.  We will start metformin 500 mg daily, update by MyChart next few weeks to decide on higher doses.  Potential side effects discussed  Check A1c, lipids at lab only visit in 6 weeks.  Hypertriglyceridemia - Plan: Comprehensive metabolic panel, Lipid panel  -Anticipate improvement with  weight loss as above, commended on dietary changes.  6 week lab only visit for repeat testing, hold on new meds for now  Daytime somnolence  -New problem, appears to be more of an issue when he is only slept 6 hours.  No known history of sleep apnea.  Trial of 7 to 8 hours per night sleep, then if persistent daytime somnolence consider sleep study to rule out OSA, less  likely.  Muscle cramps  -New problem, had noted intermittent prior, now notices more frequently.  Normal exam.  Electrolytes reassuring in August.  Adequate hydration during the day discussed, stretches of affected muscles prior to bedtime discussed and demonstrated in office.  If not improving in the next 1 to 2 weeks, can order electrolytes, look into other causes.  RTC precautions.  Continue follow-up with orthopedics regarding forearm pain.  No orders of the defined types were placed in this encounter.  Patient Instructions   Try to increase sleep to 7-8 hours, then if still sleepy during the day would recommend sleep study. Let me know.   Congrats on the weight loss! We can recheck A1c and cholesterol levels with a lab visit in next 6 weeks. Keep up the good work.   Stretches nightly before going to bed as we discussed.  Make sure to stay hydrated throughout the day.  If you continue to have muscle cramps in the next few weeks, let me know and we can check some labs and discuss other causes.     Signed,   Merri Ray, MD [place] 05/17/21 9:23 AM

## 2021-05-17 NOTE — Patient Instructions (Addendum)
  Try to increase sleep to 7-8 hours, then if still sleepy during the day would recommend sleep study. Let me know.   Congrats on the weight loss! We can recheck A1c and cholesterol levels with a lab visit in next 6 weeks. Keep up the good work.   Stretches nightly before going to bed as we discussed.  Make sure to stay hydrated throughout the day.  If you continue to have muscle cramps in the next few weeks, let me know and we can check some labs and discuss other causes.

## 2021-05-23 ENCOUNTER — Encounter: Payer: Managed Care, Other (non HMO) | Admitting: Occupational Therapy

## 2021-07-01 ENCOUNTER — Other Ambulatory Visit: Payer: Managed Care, Other (non HMO)

## 2021-07-16 ENCOUNTER — Other Ambulatory Visit: Payer: Managed Care, Other (non HMO)

## 2021-07-19 ENCOUNTER — Other Ambulatory Visit (INDEPENDENT_AMBULATORY_CARE_PROVIDER_SITE_OTHER): Payer: Managed Care, Other (non HMO)

## 2021-07-19 DIAGNOSIS — E119 Type 2 diabetes mellitus without complications: Secondary | ICD-10-CM

## 2021-07-19 DIAGNOSIS — E781 Pure hyperglyceridemia: Secondary | ICD-10-CM | POA: Diagnosis not present

## 2021-07-19 LAB — LIPID PANEL
Cholesterol: 178 mg/dL (ref 0–200)
HDL: 30.6 mg/dL — ABNORMAL LOW (ref >39.00)
LDL Cholesterol: 113 mg/dL — ABNORMAL HIGH (ref 0–99)
NonHDL: 147.41
Total CHOL/HDL Ratio: 6
Triglycerides: 173 mg/dL — ABNORMAL HIGH (ref 0.0–149.0)
VLDL: 34.6 mg/dL (ref 0.0–40.0)

## 2021-07-19 LAB — HEMOGLOBIN A1C: Hgb A1c MFr Bld: 8.8 % — ABNORMAL HIGH (ref 4.6–6.5)

## 2021-07-19 LAB — COMPREHENSIVE METABOLIC PANEL
ALT: 22 U/L (ref 0–53)
AST: 12 U/L (ref 0–37)
Albumin: 3.9 g/dL (ref 3.5–5.2)
Alkaline Phosphatase: 50 U/L (ref 39–117)
BUN: 10 mg/dL (ref 6–23)
CO2: 30 mEq/L (ref 19–32)
Calcium: 8.9 mg/dL (ref 8.4–10.5)
Chloride: 100 mEq/L (ref 96–112)
Creatinine, Ser: 0.98 mg/dL (ref 0.40–1.50)
GFR: 93.05 mL/min (ref >60.00)
Glucose, Bld: 188 mg/dL — ABNORMAL HIGH (ref 70–99)
Potassium: 4.1 mEq/L (ref 3.5–5.1)
Sodium: 137 mEq/L (ref 135–145)
Total Bilirubin: 0.6 mg/dL (ref 0.2–1.2)
Total Protein: 6.4 g/dL (ref 6.0–8.3)

## 2021-08-19 ENCOUNTER — Ambulatory Visit: Payer: Managed Care, Other (non HMO) | Admitting: Family Medicine

## 2021-08-22 ENCOUNTER — Ambulatory Visit: Payer: Managed Care, Other (non HMO) | Admitting: Family Medicine

## 2021-08-22 VITALS — BP 122/82 | HR 67 | Temp 98.0°F | Resp 16 | Ht 70.0 in | Wt 225.0 lb

## 2021-08-22 DIAGNOSIS — M79642 Pain in left hand: Secondary | ICD-10-CM

## 2021-08-22 DIAGNOSIS — E119 Type 2 diabetes mellitus without complications: Secondary | ICD-10-CM | POA: Diagnosis not present

## 2021-08-22 DIAGNOSIS — M542 Cervicalgia: Secondary | ICD-10-CM | POA: Diagnosis not present

## 2021-08-22 DIAGNOSIS — E785 Hyperlipidemia, unspecified: Secondary | ICD-10-CM

## 2021-08-22 MED ORDER — METFORMIN HCL 500 MG PO TABS: 500.0000 mg | ORAL_TABLET | Freq: Two times a day (BID) | ORAL | 1 refills | Status: DC

## 2021-08-22 NOTE — Patient Instructions (Addendum)
Repeat diabetes test today.  Keep up good work with diet. Increase metformin to 2 times per day.   Xray of neck and hand at [facility]. I will refer you back to your ortho for arm and hand pain.     Diabetes Mellitus and Nutrition, Adult When you have diabetes, or diabetes mellitus, it is very important to have healthy eating habits because your blood sugar (glucose) levels are greatly affected by what you eat and drink. Eating healthy foods in the right amounts, at about the same times every day, can help you: Manage your blood glucose. Lower your risk of heart disease. Improve your blood pressure. Reach or maintain a healthy weight. What can affect my meal plan? Every person with diabetes is different, and each person has different needs for a meal plan. Your health care provider may recommend that you work with a dietitian to make a meal plan that is best for you. Your meal plan may vary depending on factors such as: The calories you need. The medicines you take. Your weight. Your blood glucose, blood pressure, and cholesterol levels. Your activity level. Other health conditions you have, such as heart or kidney disease. How do carbohydrates affect me? Carbohydrates, also called carbs, affect your blood glucose level more than any other type of food. Eating carbs raises the amount of glucose in your blood. It is important to know how many carbs you can safely have in each meal. This is different for every person. Your dietitian can help you calculate how many carbs you should have at each meal and for each snack. How does alcohol affect me? Alcohol can cause a decrease in blood glucose (hypoglycemia), especially if you use insulin or take certain diabetes medicines by mouth. Hypoglycemia can be a life-threatening condition. Symptoms of hypoglycemia, such as sleepiness, dizziness, and confusion, are similar to symptoms of having too much alcohol. Do not drink alcohol if: Your health  care provider tells you not to drink. You are pregnant, may be pregnant, or are planning to become pregnant. If you drink alcohol: Limit how much you have to: 0-1 drink a day for women. 0-2 drinks a day for men. Know how much alcohol is in your drink. In the [place] one drink equals one 12 oz bottle of beer (355 mL), one 5 oz glass of wine (148 mL), or one 1 oz glass of hard liquor (44 mL). Keep yourself hydrated with water, diet soda, or unsweetened iced tea. Keep in mind that regular soda, juice, and other mixers may contain a lot of sugar and must be counted as carbs. What are tips for following this plan? Reading food labels Start by checking the serving size on the Nutrition Facts label of packaged foods and drinks. The number of calories and the amount of carbs, fats, and other nutrients listed on the label are based on one serving of the item. Many items contain more than one serving per package. Check the total grams (g) of carbs in one serving. Check the number of grams of saturated fats and trans fats in one serving. Choose foods that have a low amount or none of these fats. Check the number of milligrams (mg) of salt (sodium) in one serving. Most people should limit total sodium intake to less than 2,300 mg per day. Always check the nutrition information of foods labeled as "low-fat" or "nonfat." These foods may be higher in added sugar or refined carbs and should be avoided. Talk to your dietitian  to identify your daily goals for nutrients listed on the label. Shopping Avoid buying canned, pre-made, or processed foods. These foods tend to be high in fat, sodium, and added sugar. Shop around the outside edge of the grocery store. This is where you will most often find fresh fruits and vegetables, bulk grains, fresh meats, and fresh dairy products. Cooking Use low-heat cooking methods, such as baking, instead of high-heat cooking methods, such as deep frying. Cook using healthy oils,  such as olive, canola, or sunflower oil. Avoid cooking with butter, cream, or high-fat meats. Meal planning Eat meals and snacks regularly, preferably at the same times every day. Avoid going long periods of time without eating. Eat foods that are high in fiber, such as fresh fruits, vegetables, beans, and whole grains. Eat 4-6 oz (112-168 g) of lean protein each day, such as lean meat, chicken, fish, eggs, or tofu. One ounce (oz) (28 g) of lean protein is equal to: 1 oz (28 g) of meat, chicken, or fish. 1 egg.  cup (62 g) of tofu. Eat some foods each day that contain healthy fats, such as avocado, nuts, seeds, and fish. What foods should I eat? Fruits Berries. Apples. Oranges. Peaches. Apricots. Plums. Grapes. Mangoes. Papayas. Pomegranates. Kiwi. Cherries. Vegetables Leafy greens, including lettuce, spinach, kale, chard, collard greens, mustard greens, and cabbage. Beets. Cauliflower. Broccoli. Carrots. Green beans. Tomatoes. Peppers. Onions. Cucumbers. Brussels sprouts. Grains Whole grains, such as whole-wheat or whole-grain bread, crackers, tortillas, cereal, and pasta. Unsweetened oatmeal. Quinoa. Brown or wild rice. Meats and other proteins Seafood. Poultry without skin. Lean cuts of poultry and beef. Tofu. Nuts. Seeds. Dairy Low-fat or fat-free dairy products such as milk, yogurt, and cheese. The items listed above may not be a complete list of foods and beverages you can eat and drink. Contact a dietitian for more information. What foods should I avoid? Fruits Fruits canned with syrup. Vegetables Canned vegetables. Frozen vegetables with butter or cream sauce. Grains Refined white flour and flour products such as bread, pasta, snack foods, and cereals. Avoid all processed foods. Meats and other proteins Fatty cuts of meat. Poultry with skin. Breaded or fried meats. Processed meat. Avoid saturated fats. Dairy Full-fat yogurt, cheese, or milk. Beverages Sweetened drinks, such  as soda or iced tea. The items listed above may not be a complete list of foods and beverages you should avoid. Contact a dietitian for more information. Questions to ask a health care provider Do I need to meet with a certified diabetes care and education specialist? Do I need to meet with a dietitian? What number can I call if I have questions? When are the best times to check my blood glucose? Where to find more information: American Diabetes Association: diabetes.org Academy of Nutrition and Dietetics: eatright.[employer] of Diabetes and Digestive and Kidney Diseases: [employer]en[employer].is Association of Diabetes Care & Education Specialists: diabeteseducator.org Summary It is important to have healthy eating habits because your blood sugar (glucose) levels are greatly affected by what you eat and drink. It is important to use alcohol carefully. A healthy meal plan will help you manage your blood glucose and lower your risk of heart disease. Your health care provider may recommend that you work with a dietitian to make a meal plan that is best for you. This information is not intended to replace advice given to you by your health care provider. Make sure you discuss any questions you have with your health care provider. Document Revised: 02/22/2020 Document Reviewed:  02/22/2020 Elsevier Patient Education  2022 [employer].

## 2021-08-22 NOTE — Progress Notes (Signed)
Subjective:  Patient ID: Brian Reid, male    DOB: [birth year]  Age: 46 y.o. MRN: [id number]  CC:  Chief Complaint  Patient presents with   Hand Pain    Pt reports hand pain with small lump on back side of hand notes has effected his grip strength as well as tender to touch   Diabetes    Follow up lab work   Hyperlipidemia    Follow up lab work    Neck Pain    Notes neck pain recently when he wakes in the morning has been on and off     HPI Brian Reid presents for   Multiple concerns above:  Left hand pain Past few months. Nki.  With knot noted on the back of his hand, sore to touch, affects grip. R hand dominant, no prior similar issue Attempted treatments: none.   Neck pain: Off and on in the morning when he wakes up - past few months, few episodes. Improves in few days. No treatment. Upper left neck. No new activities, no radiating pain, No arm weakness.  No known injury recently.  Attempted treatments. Seen by Dr. Tempie Donning for prior left forearm pain in 04/2021, has not followed up. Some symptoms retrun after activity.   Hyperlipidemia: No current statin. Trying to work on diet. Less adherence over holidays. Declines statin at this time.  Lab Results  Component Value Date   CHOL 178 07/19/2021   HDL 30.60 (L) 07/19/2021   LDLCALC 113 (H) 07/19/2021   LDLDIRECT 129.0 03/13/2021   TRIG 173.0 (H) 07/19/2021   CHOLHDL 6 07/19/2021   Lab Results  Component Value Date   ALT 22 07/19/2021   AST 12 07/19/2021   ALKPHOS 50 07/19/2021   BILITOT 0.6 07/19/2021    Diabetes: Uncontrolled last visit - metformin 535m qd. Recommended BID based on readings. Took twice per day for 1 week, then once per day . No new side effects.  Home readings - 120-150, lowest 111. No sx lows.  Plans on increased exercise and diet changes.   Lab Results  Component Value Date   HGBA1C 8.8 (H) 07/19/2021   HGBA1C 7.6 (H) 03/13/2021   HGBA1C 6.2 (H) 10/12/2020   Lab Results  Component  Value Date   MICROALBUR <0.7 05/17/2021   LDLCALC 113 (H) 07/19/2021   CREATININE 0.98 07/19/2021     History Patient Active Problem List   Diagnosis Date Noted   Left forearm pain 04/12/2021   Acute appendicitis s/p lap appendectomy 04/02/2016 04/03/2016   Past Medical History:  Diagnosis Date   Asthma    Past Surgical History:  Procedure Laterality Date   APPENDECTOMY N/A    [employer] 06/27/2020   LAPAROSCOPIC APPENDECTOMY N/A 04/02/2016   Procedure: APPENDECTOMY LAPAROSCOPIC;  Surgeon: PAutumn MessingIII, MD;  Location: [facility];  Service: General;  Laterality: N/A;   LEG SURGERY     age 46  OPEN REDUCTION SHOULDER DISLOCATION     age 46  No Known Allergies Prior to Admission medications   Medication Sig Start Date End Date Taking? Authorizing Provider  metFORMIN (GLUCOPHAGE) 500 MG tablet Take 1 tablet (500 mg total) by mouth daily with breakfast. 05/17/21  Yes GWendie Agreste MD  meloxicam (MOBIC) 7.5 MG tablet Take 1 tablet (7.5 mg total) by mouth daily. As needed. Patient not taking: Reported on 04/25/2021 03/13/21   GWendie Agreste MD   Social History   Socioeconomic History   Marital status: Married  Spouse name: Deneise Lever   Number of children: 2   Years of education: post grad   Highest education level: Not on file  Occupational History    Comment: IT professional  Tobacco Use   Smoking status: Former   Smokeless tobacco: Never   Tobacco comments:    03/05/16 "very occasional now", 06/06/16 not smoking  Vaping Use   Vaping Use: Never used  Substance and Sexual Activity   Alcohol use: Yes    Alcohol/week: 2.0 standard drinks    Types: 2 Standard drinks or equivalent per week    Comment: social   Drug use: No   Sexual activity: Yes  Other Topics Concern   Not on file  Social History Narrative   Lives with family   Drinks 1 cup of coffee a day    Social Determinants of Health   Financial Resource Strain: Not on file  Food Insecurity: Not on file   Transportation Needs: Not on file  Physical Activity: Not on file  Stress: Not on file  Social Connections: Not on file  Intimate Partner Violence: Not on file    Review of Systems  Constitutional:  Negative for fatigue and unexpected weight change.  Eyes:  Negative for visual disturbance.  Respiratory:  Negative for cough, chest tightness and shortness of breath.   Cardiovascular:  Negative for chest pain, palpitations and leg swelling.  Gastrointestinal:  Negative for abdominal pain and blood in stool.  Musculoskeletal:  Positive for arthralgias and joint swelling.  Neurological:  Negative for dizziness, light-headedness and headaches.    Objective:   Vitals:   08/22/21 1145  BP: 122/82  Pulse: 67  Resp: 16  Temp: 98 F (36.7 C)  TempSrc: Temporal  SpO2: 98%  Weight: 225 lb (102.1 kg)  Height: 5' 10"  (1.778 m)     Physical Exam Vitals reviewed.  Constitutional:      Appearance: He is well-developed.  HENT:     Head: Normocephalic and atraumatic.  Neck:     Vascular: No carotid bruit or JVD.  Cardiovascular:     Rate and Rhythm: Normal rate and regular rhythm.     Heart sounds: Normal heart sounds. No murmur heard. Pulmonary:     Effort: Pulmonary effort is normal.     Breath sounds: Normal breath sounds. No rales.  Musculoskeletal:     Right lower leg: No edema.     Left lower leg: No edema.     Comments: C-spine no midline bony tenderness, full range of motion, pain-free range of motion.  Describes area of previous discomfort at the left upper paraspinals.  Equal upper extremity strength.  No radiation of pain to the arms with cervical exam.  Left hand, nodular area at the base of the dorsal first metacarpal.  Tender to palpation.  Slight discomfort with finger flexion but able to extend and flex ranges.  Skin intact without erythema.  Wrist nontender.  Neurovascular intact distally.  Skin:    General: Skin is warm and dry.  Neurological:     Mental  Status: He is alert and oriented to person, place, and time.  Psychiatric:        Mood and Affect: Mood normal.       Assessment & Plan:  Brian Reid is a 46 y.o. male . Left hand pain - Plan: Ambulatory referral to Orthopedic Surgery, DG Hand Complete Left  -Nodular area at the dorsal left hand, cystic versus bony based on location.  Check x-ray.  Refer back to his previous orthopedist for further evaluation.  Tylenol if needed.  Diabetes mellitus without complication ([place]) - Plan: metFORMIN (GLUCOPHAGE) 500 MG tablet  -Uncontrolled, handout given on diabetes and nutrition but recommended metformin 500 mg twice daily for now with repeat A1c at 3 months from previous testing.  Commended on his plan for increased exercise and diet changes.  Neck pain - Plan: DG Cervical Spine 2 or 3 views  -Intermittent.  Reassuring exam.  Check x-ray to evaluate for degenerative changes, Tylenol, stretches, range of motion with RTC precautions given.  Hyperlipidemia, unspecified hyperlipidemia type  -Discussed statin recommendation with diabetes.  Declined at this time.  Plans on diet/exercise changes.  Recheck levels in 3 months.  Anticipate improved triglycerides with improvement of DM.   Meds ordered this encounter  Medications   metFORMIN (GLUCOPHAGE) 500 MG tablet    Sig: Take 1 tablet (500 mg total) by mouth 2 (two) times daily with a meal.    Dispense:  180 tablet    Refill:  1   Patient Instructions  Repeat diabetes test today.  Keep up good work with diet. Increase metformin to 2 times per day.   [facility] hand [facility]. I will refer you back to your ortho for arm and hand pain.     Diabetes Mellitus and Nutrition, Adult When you have diabetes, or diabetes mellitus, it is very important to have healthy eating habits because your blood sugar (glucose) levels are greatly affected by what you eat and drink. Eating healthy foods in the right amounts, at about the same times  every day, can help you: Manage your blood glucose. Lower your risk of heart disease. Improve your blood pressure. Reach or maintain a healthy weight. What can affect my meal plan? Every person with diabetes is different, and each person has different needs for a meal plan. Your health care provider may recommend that you work with a dietitian to make a meal plan that is best for you. Your meal plan may vary depending on factors such as: The calories you need. The medicines you take. Your weight. Your blood glucose, blood pressure, and cholesterol levels. Your activity level. Other health conditions you have, such as heart or kidney disease. How do carbohydrates affect me? Carbohydrates, also called carbs, affect your blood glucose level more than any other type of food. Eating carbs raises the amount of glucose in your blood. It is important to know how many carbs you can safely have in each meal. This is different for every person. Your dietitian can help you calculate how many carbs you should have at each meal and for each snack. How does alcohol affect me? Alcohol can cause a decrease in blood glucose (hypoglycemia), especially if you use insulin or take certain diabetes medicines by mouth. Hypoglycemia can be a life-threatening condition. Symptoms of hypoglycemia, such as sleepiness, dizziness, and confusion, are similar to symptoms of having too much alcohol. Do not drink alcohol if: Your health care provider tells you not to drink. You are pregnant, may be pregnant, or are planning to become pregnant. If you drink alcohol: Limit how much you have to: 0-1 drink a day for women. 0-2 drinks a day for men. Know how much alcohol is in your drink. In the [place] one drink equals one 12 oz bottle of beer (355 mL), one 5 oz glass of wine (148 mL), or one 1 oz glass of hard liquor (44 mL). Keep yourself  hydrated with water, diet soda, or unsweetened iced tea. Keep in mind that regular soda,  juice, and other mixers may contain a lot of sugar and must be counted as carbs. What are tips for following this plan? Reading food labels Start by checking the serving size on the Nutrition Facts label of packaged foods and drinks. The number of calories and the amount of carbs, fats, and other nutrients listed on the label are based on one serving of the item. Many items contain more than one serving per package. Check the total grams (g) of carbs in one serving. Check the number of grams of saturated fats and trans fats in one serving. Choose foods that have a low amount or none of these fats. Check the number of milligrams (mg) of salt (sodium) in one serving. Most people should limit total sodium intake to less than 2,300 mg per day. Always check the nutrition information of foods labeled as "low-fat" or "nonfat." These foods may be higher in added sugar or refined carbs and should be avoided. Talk to your dietitian to identify your daily goals for nutrients listed on the label. Shopping Avoid buying canned, pre-made, or processed foods. These foods tend to be high in fat, sodium, and added sugar. Shop around the outside edge of the grocery store. This is where you will most often find fresh fruits and vegetables, bulk grains, fresh meats, and fresh dairy products. Cooking Use low-heat cooking methods, such as baking, instead of high-heat cooking methods, such as deep frying. Cook using healthy oils, such as olive, canola, or sunflower oil. Avoid cooking with butter, cream, or high-fat meats. Meal planning Eat meals and snacks regularly, preferably at the same times every day. Avoid going long periods of time without eating. Eat foods that are high in fiber, such as fresh fruits, vegetables, beans, and whole grains. Eat 4-6 oz (112-168 g) of lean protein each day, such as lean meat, chicken, fish, eggs, or tofu. One ounce (oz) (28 g) of lean protein is equal to: 1 oz (28 g) of meat,  chicken, or fish. 1 egg.  cup (62 g) of tofu. Eat some foods each day that contain healthy fats, such as avocado, nuts, seeds, and fish. What foods should I eat? Fruits Berries. Apples. Oranges. Peaches. Apricots. Plums. Grapes. Mangoes. Papayas. Pomegranates. Kiwi. Cherries. Vegetables Leafy greens, including lettuce, spinach, kale, chard, collard greens, mustard greens, and cabbage. Beets. Cauliflower. Broccoli. Carrots. Green beans. Tomatoes. Peppers. Onions. Cucumbers. Brussels sprouts. Grains Whole grains, such as whole-wheat or whole-grain bread, crackers, tortillas, cereal, and pasta. Unsweetened oatmeal. Quinoa. Brown or wild rice. Meats and other proteins Seafood. Poultry without skin. Lean cuts of poultry and beef. Tofu. Nuts. Seeds. Dairy Low-fat or fat-free dairy products such as milk, yogurt, and cheese. The items listed above may not be a complete list of foods and beverages you can eat and drink. Contact a dietitian for more information. What foods should I avoid? Fruits Fruits canned with syrup. Vegetables Canned vegetables. Frozen vegetables with butter or cream sauce. Grains Refined white flour and flour products such as bread, pasta, snack foods, and cereals. Avoid all processed foods. Meats and other proteins Fatty cuts of meat. Poultry with skin. Breaded or fried meats. Processed meat. Avoid saturated fats. Dairy Full-fat yogurt, cheese, or milk. Beverages Sweetened drinks, such as soda or iced tea. The items listed above may not be a complete list of foods and beverages you should avoid. Contact a dietitian for more information. Questions  to ask a health care provider Do I need to meet with a certified diabetes care and education specialist? Do I need to meet with a dietitian? What number can I call if I have questions? When are the best times to check my blood glucose? Where to find more information: American Diabetes Association: diabetes.org Academy of  Nutrition and Dietetics: eatright.[employer] of Diabetes and Digestive and Kidney Diseases: [employer]en[employer].is Association of Diabetes Care & Education Specialists: diabeteseducator.org Summary It is important to have healthy eating habits because your blood sugar (glucose) levels are greatly affected by what you eat and drink. It is important to use alcohol carefully. A healthy meal plan will help you manage your blood glucose and lower your risk of heart disease. Your health care provider may recommend that you work with a dietitian to make a meal plan that is best for you. This information is not intended to replace advice given to you by your health care provider. Make sure you discuss any questions you have with your health care provider. Document Revised: 02/22/2020 Document Reviewed: 02/22/2020 Elsevier Patient Education  2022 [place],   Merri Ray, MD [place] 08/22/21 12:44 PM

## 2021-09-03 ENCOUNTER — Ambulatory Visit: Payer: Managed Care, Other (non HMO) | Admitting: Orthopedic Surgery

## 2021-09-04 ENCOUNTER — Ambulatory Visit (INDEPENDENT_AMBULATORY_CARE_PROVIDER_SITE_OTHER)
Admission: RE | Admit: 2021-09-04 | Discharge: 2021-09-04 | Disposition: A | Discharge status: Home / Self Care | Payer: Managed Care, Other (non HMO) | Source: Ambulatory Visit | Attending: Family Medicine | Admitting: Family Medicine

## 2021-09-04 ENCOUNTER — Other Ambulatory Visit: Payer: Self-pay

## 2021-09-04 DIAGNOSIS — M542 Cervicalgia: Secondary | ICD-10-CM | POA: Diagnosis not present

## 2021-09-04 DIAGNOSIS — M79642 Pain in left hand: Secondary | ICD-10-CM | POA: Diagnosis not present

## 2021-09-05 ENCOUNTER — Ambulatory Visit: Payer: Managed Care, Other (non HMO) | Admitting: Orthopedic Surgery

## 2021-09-09 ENCOUNTER — Encounter: Payer: Self-pay | Admitting: Orthopedic Surgery

## 2021-09-09 ENCOUNTER — Ambulatory Visit (INDEPENDENT_AMBULATORY_CARE_PROVIDER_SITE_OTHER): Payer: Managed Care, Other (non HMO) | Admitting: Orthopedic Surgery

## 2021-09-09 ENCOUNTER — Other Ambulatory Visit: Payer: Self-pay

## 2021-09-09 DIAGNOSIS — M79642 Pain in left hand: Secondary | ICD-10-CM | POA: Insufficient documentation

## 2021-09-09 NOTE — Progress Notes (Signed)
Office Visit Note   Patient: Brian Reid           Date of Birth: 24-Feb-1976           MRN: [id number] Visit Date: 09/09/2021              Requested by: Wendie Agreste, MD [address] [place],  [place] [id number] PCP: Wendie Agreste, MD   Assessment & Plan: Visit Diagnoses:  1. 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  This been present for quite some time now.  He notes that over the last several weeks it is actually improved.  He was seen by his primary care provider he was unable to touch the area due to pain which is no longer the case.  Discussed treatment of carpal boss including immobilization, NSAIDs, steroid actions and potential debridement.  At this point, given his symptom improvement, we will start him on some anti-inflammatory medications.  He can come see me back in 4 to 6 weeks if he still having trouble.  Follow-Up Instructions: No follow-ups on file.   Orders:  No orders of the defined types were placed in this encounter.  No orders of the defined types were placed in this encounter.     Procedures: No procedures performed   Clinical Data: No additional findings.   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  He noticed a firm lump of the dorsal and radial aspect of the hand at the base of the index metacarpal.  Has not changed in size.  He denies any injury.  Not taking medication.  His pain is improved substantially since he was seen by his primary care provider.  He was previously unable to touch the area but this is no longer the case.  He will do his workouts including push-ups without any discomfort.   Review of Systems   Objective: Vital Signs: BP 122/79    Pulse 63    Ht 5' 10"   (1.778 m)    Wt 228 lb (103.4 kg)    BMI 32.71 kg/m   Physical Exam  Left Hand Exam   Tenderness  The patient is experiencing no tenderness.   Range of Motion  The patient has normal left wrist ROM.  Muscle Strength  The patient has normal left wrist strength.  Other  Erythema: absent Sensation: normal Pulse: present  Comments:  Firm, immobile mass of the dorsal radial aspect of the hand at the base of the next metacarpal.  No surrounding erythema induration.  Mass does not transilluminate.  No snapping of any tendons with range of motion.     Specialty Comments:  No specialty comments available.  Imaging: No results found.   PMFS History: Patient Active Problem List   Diagnosis Date Noted   Pain in left hand 09/09/2021   Left forearm pain 04/12/2021   Acute appendicitis s/p lap appendectomy 04/02/2016 04/03/2016   Past Medical History:  Diagnosis Date   Asthma     Family History  Problem Relation Age of Onset   Diabetes Mother    Hypertension Mother    Stroke Mother    Diabetes Father    Heart disease Father  Hypertension Sister     Past Surgical History:  Procedure Laterality Date   APPENDECTOMY N/A    [facility] 06/27/2020   LAPAROSCOPIC APPENDECTOMY N/A 04/02/2016   Procedure: APPENDECTOMY LAPAROSCOPIC;  Surgeon: Autumn Messing III, MD;  Location: [facility];  Service: General;  Laterality: N/A;   LEG SURGERY     age 73   OPEN REDUCTION SHOULDER DISLOCATION     age 49   Social History   Occupational History    Comment: IT professional  Tobacco Use   Smoking status: Former   Smokeless tobacco: Never   Tobacco comments:    03/05/16 "very occasional now", 06/06/16 not smoking  Vaping Use   Vaping Use: Never used  Substance and Sexual Activity   Alcohol use: Yes    Alcohol/week: 2.0 standard drinks    Types: 2 Standard drinks or equivalent per week    Comment: social   Drug use: No   Sexual activity: Yes

## 2021-10-16 ENCOUNTER — Telehealth: Payer: Self-pay | Admitting: Family Medicine

## 2021-10-16 NOTE — Telephone Encounter (Signed)
Pt called in asking for a refill on the Metformin pt has an appt  on 3/31 he uses [facility] [place]  ?

## 2021-10-16 NOTE — Telephone Encounter (Signed)
6  months sent in in January  ?

## 2021-10-16 NOTE — Telephone Encounter (Signed)
LM informing pt.

## 2021-11-01 ENCOUNTER — Ambulatory Visit: Payer: Managed Care, Other (non HMO) | Admitting: Family Medicine

## 2021-11-22 ENCOUNTER — Ambulatory Visit: Payer: Managed Care, Other (non HMO) | Admitting: Family Medicine

## 2021-11-22 VITALS — BP 132/76 | HR 57 | Temp 98.0°F | Resp 16 | Ht 70.0 in | Wt 227.8 lb

## 2021-11-22 DIAGNOSIS — E1165 Type 2 diabetes mellitus with hyperglycemia: Secondary | ICD-10-CM

## 2021-11-22 DIAGNOSIS — E785 Hyperlipidemia, unspecified: Secondary | ICD-10-CM

## 2021-11-22 LAB — LIPID PANEL
Cholesterol: 204 mg/dL — ABNORMAL HIGH (ref 0–200)
HDL: 36.4 mg/dL — ABNORMAL LOW (ref >39.00)
NonHDL: 167.26
Total CHOL/HDL Ratio: 6
Triglycerides: 218 mg/dL — ABNORMAL HIGH (ref 0.0–149.0)
VLDL: 43.6 mg/dL — ABNORMAL HIGH (ref 0.0–40.0)

## 2021-11-22 LAB — COMPREHENSIVE METABOLIC PANEL
ALT: 28 U/L (ref 0–53)
AST: 19 U/L (ref 0–37)
Albumin: 4.6 g/dL (ref 3.5–5.2)
Alkaline Phosphatase: 53 U/L (ref 39–117)
BUN: 10 mg/dL (ref 6–23)
CO2: 32 mEq/L (ref 19–32)
Calcium: 9.3 mg/dL (ref 8.4–10.5)
Chloride: 101 mEq/L (ref 96–112)
Creatinine, Ser: 0.94 mg/dL (ref 0.40–1.50)
GFR: 97.59 mL/min (ref >60.00)
Glucose, Bld: 103 mg/dL — ABNORMAL HIGH (ref 70–99)
Potassium: 4.3 mEq/L (ref 3.5–5.1)
Sodium: 139 mEq/L (ref 135–145)
Total Bilirubin: 0.7 mg/dL (ref 0.2–1.2)
Total Protein: 6.9 g/dL (ref 6.0–8.3)

## 2021-11-22 LAB — LDL CHOLESTEROL, DIRECT: Direct LDL: 141 mg/dL

## 2021-11-22 LAB — HEMOGLOBIN A1C: Hgb A1c MFr Bld: 6.6 % — ABNORMAL HIGH (ref 4.6–6.5)

## 2021-11-22 NOTE — Progress Notes (Signed)
? ?Subjective:  ?Patient ID: Brian Reid, male    DOB: 25-May-1976  Age: 46 y.o. MRN: [id number] ? ?CC:  ?Chief Complaint  ?Patient presents with  ? Diabetes  ?  Due for recheck today no concerns   ? Hyperlipidemia  ?  Pt here for recheck notes he is fasting today no questions   ? ? ?HPI ?Brian Reid presents for  ? ?Diabetes: ?Complicated by hyperglycemia with worsening control on A1c in November.  Metformin was started at 500 mg daily, and then twice daily based on his readings.  As of January visit he took the medication twice per day for a week then once per day.  Home readings reportedly at that time 120-150 with a lowest of 111, no symptomatic lows.  Plan to increase exercise and diet changes, but we did recommend twice daily dosing of metformin at his January visit ?Home readings - not checked recently.  ?No new side effects with dosing BID. No diarrhea.  ?Some fatigue at times.  ?Trying to watch diet, exercise - once per week.  ? ?Wt Readings from Last 3 Encounters:  ?11/22/21 227 lb 12.8 oz (103.3 kg)  ?09/09/21 228 lb (103.4 kg)  ?08/22/21 225 lb (102.1 kg)  ? ?Microalbumin: nl in 05/2021 ?Optho, foot exam, pneumovax:  ?Optho last year. ?Pneumonia vaccine recommended - declined today.  ? ?Lab Results  ?Component Value Date  ? HGBA1C 8.8 (H) 07/19/2021  ? HGBA1C 7.6 (H) 03/13/2021  ? HGBA1C 6.2 (H) 10/12/2020  ? ?Lab Results  ?Component Value Date  ? MICROALBUR <0.7 05/17/2021  ? LDLCALC 113 (H) 07/19/2021  ? CREATININE 0.98 07/19/2021  ? ?Hyperlipidemia: ?Statin recommended previously, declined.  Planned on diet/exercise approach.  Did discuss statin recommendation with diabetes and still declined. ?Would consider statin, or intermittent dosing if elevated readings.  ?Fasting today.  ?Lab Results  ?Component Value Date  ? CHOL 178 07/19/2021  ? HDL 30.60 (L) 07/19/2021  ? LDLCALC 113 (H) 07/19/2021  ? LDLDIRECT 129.0 03/13/2021  ? TRIG 173.0 (H) 07/19/2021  ? CHOLHDL 6 07/19/2021  ? ?Lab Results  ?Component  Value Date  ? ALT 22 07/19/2021  ? AST 12 07/19/2021  ? ALKPHOS 50 07/19/2021  ? BILITOT 0.6 07/19/2021  ? ? ?History ?Patient Active Problem List  ? Diagnosis Date Noted  ? Pain in left hand 09/09/2021  ? Left forearm pain 04/12/2021  ? Acute appendicitis s/p lap appendectomy 04/02/2016 04/03/2016  ? ?Past Medical History:  ?Diagnosis Date  ? Asthma   ? ?Past Surgical History:  ?Procedure Laterality Date  ? APPENDECTOMY N/A   ? [facility] 06/27/2020  ? LAPAROSCOPIC APPENDECTOMY N/A 04/02/2016  ? Procedure: APPENDECTOMY LAPAROSCOPIC;  Surgeon: Autumn Messing III, MD;  Location: [facility];  Service: General;  Laterality: N/A;  ? LEG SURGERY    ? age 64  ? OPEN REDUCTION SHOULDER DISLOCATION    ? age 33  ? ?No Known Allergies ?Prior to Admission medications   ?Medication Sig Start Date End Date Taking? Authorizing Provider  ?metFORMIN (GLUCOPHAGE) 500 MG tablet Take 1 tablet (500 mg total) by mouth 2 (two) times daily with a meal. 08/22/21  Yes Wendie Agreste, MD  ? ?Social History  ? ?Socioeconomic History  ? Marital status: Married  ?  Spouse name: Deneise Lever  ? Number of children: 2  ? Years of education: post grad  ? Highest education level: Not on file  ?Occupational History  ?  Comment: IT professional  ?Tobacco Use  ?  Smoking status: Former  ? Smokeless tobacco: Never  ? Tobacco comments:  ?  03/05/16 "very occasional now", 06/06/16 not smoking  ?Vaping Use  ? Vaping Use: Never used  ?Substance and Sexual Activity  ? Alcohol use: Yes  ?  Alcohol/week: 2.0 standard drinks  ?  Types: 2 Standard drinks or equivalent per week  ?  Comment: social  ? Drug use: No  ? Sexual activity: Yes  ?Other Topics Concern  ? Not on file  ?Social History Narrative  ? Lives with family  ? Drinks 1 cup of coffee a day   ? ?Social Determinants of Health  ? ?Financial Resource Strain: Not on file  ?Food Insecurity: Not on file  ?Transportation Needs: Not on file  ?Physical Activity: Not on file  ?Stress: Not on file  ?Social Connections: Not on file   ?Intimate Partner Violence: Not on file  ? ? ?Review of Systems  ?Constitutional:  Negative for fatigue and unexpected weight change.  ?Eyes:  Negative for visual disturbance.  ?Respiratory:  Negative for cough, chest tightness and shortness of breath.   ?Cardiovascular:  Negative for chest pain, palpitations and leg swelling.  ?Gastrointestinal:  Negative for abdominal pain and blood in stool.  ?Neurological:  Negative for dizziness, light-headedness and headaches.  ? ? ?Objective:  ? ?Vitals:  ? 11/22/21 0918  ?BP: 132/76  ?Pulse: (!) 57  ?Resp: 16  ?Temp: 98 ?F (36.7 ?C)  ?TempSrc: Temporal  ?SpO2: 97%  ?Weight: 227 lb 12.8 oz (103.3 kg)  ?Height: 5' 10"  (1.778 m)  ? ? ? ?Physical Exam ?Vitals reviewed.  ?Constitutional:   ?   Appearance: He is well-developed.  ?HENT:  ?   Head: Normocephalic and atraumatic.  ?Neck:  ?   Vascular: No carotid bruit or JVD.  ?Cardiovascular:  ?   Rate and Rhythm: Normal rate and regular rhythm.  ?   Heart sounds: Normal heart sounds. No murmur heard. ?Pulmonary:  ?   Effort: Pulmonary effort is normal.  ?   Breath sounds: Normal breath sounds. No rales.  ?Musculoskeletal:  ?   Right lower leg: No edema.  ?   Left lower leg: No edema.  ?Skin: ?   General: Skin is warm and dry.  ?Neurological:  ?   Mental Status: He is alert and oriented to person, place, and time.  ?Psychiatric:     ?   Mood and Affect: Mood normal.  ? ? ? ? ? ?Assessment & Plan:  ?Brian Reid is a 46 y.o. male . ?Type 2 diabetes mellitus with hyperglycemia, without long-term current use of insulin (HCC) - Plan: Comprehensive metabolic panel, Hemoglobin A1c ? -Uncontrolled by last A1c.  No recent home readings.  Tolerating twice daily dosing of metformin.  Check A1c and adjust meds accordingly.  Continue to watch diet, exercise recommended.  Declines Pneumovax. ? ?Hyperlipidemia, unspecified hyperlipidemia type - Plan: Comprehensive metabolic panel, Lipid panel ? -Rationale for statin discussed with diabetes and  previous elevated readings.  He does agree to try statin, potentially intermittent dosing initially depending on lipid levels.  Labs pending ? ?No orders of the defined types were placed in this encounter. ? ?Patient Instructions  ?No medication changes today.  I will let you know if we need to adjust the dose of metformin or other medications based on your labs.  If cholesterol still elevated I would recommend a low-dose statin cholesterol medication, potentially few days per week initially.  I will let you know.  Recheck in 3 months but please let me know if there are questions in the meantime. ? ? ? ? ? ?Signed,  ? ?Merri Ray, MD ?[place] ?[facility] ?11/22/21 ?9:50 AM ? ? ?

## 2021-11-22 NOTE — Patient Instructions (Signed)
No medication changes today.  I will let you know if we need to adjust the dose of metformin or other medications based on your labs.  If cholesterol still elevated I would recommend a low-dose statin cholesterol medication, potentially few days per week initially.  I will let you know.  Recheck in 3 months but please let me know if there are questions in the meantime. ? ? ?

## 2022-02-21 ENCOUNTER — Ambulatory Visit: Payer: Managed Care, Other (non HMO) | Admitting: Family Medicine

## 2022-02-21 ENCOUNTER — Encounter: Payer: Self-pay | Admitting: Family Medicine

## 2022-02-21 VITALS — BP 128/78 | HR 56 | Temp 98.2°F | Resp 19 | Ht 70.0 in | Wt 232.8 lb

## 2022-02-21 DIAGNOSIS — E785 Hyperlipidemia, unspecified: Secondary | ICD-10-CM

## 2022-02-21 DIAGNOSIS — E119 Type 2 diabetes mellitus without complications: Secondary | ICD-10-CM | POA: Diagnosis not present

## 2022-02-21 LAB — POCT GLYCOSYLATED HEMOGLOBIN (HGB A1C): Hemoglobin A1C: 7.3 % — AB (ref 4.0–5.6)

## 2022-02-21 LAB — GLUCOSE, POCT (MANUAL RESULT ENTRY): POC Glucose: 142 mg/dl — AB (ref 70–99)

## 2022-02-21 MED ORDER — METFORMIN HCL 500 MG PO TABS: 500.0000 mg | ORAL_TABLET | Freq: Two times a day (BID) | ORAL | 1 refills | Status: DC

## 2022-02-21 NOTE — Patient Instructions (Addendum)
No medication changes at this time.  See information below on food choices when you are traveling.  Let me know if you change your mind about the cholesterol medication but we will recheck those labs at next visit.  Take care.   Tips for Eating Away From Home If You Have Diabetes Mellitus Managing your blood sugar (glucose) levels can be challenging when you do not prepare your own meals. The following tips can help you manage your diabetes when you eat away from home. If you have questions or if you need help, work with your health care provider or dietitian. How to plan ahead Plan ahead if you know you will be eating away from home. To do this: Try to eat your meals and snacks at about the same time each day. If you know your meal is going to be later than normal, make sure you have a small snack. Being very hungry can cause you to make unhealthy food choices. Make a list of restaurants near you that offer healthy choices. If a restaurant has a Mudlogger, take the menu home and plan what you will order ahead of time. Look up the restaurant you want to eat at online. Many chain and fast-food restaurants list nutritional information online. Use this information to choose the healthiest options and to calculate how many carbohydrates will be in your meal. Use a carbohydrate-counting book or mobile app to look up the carbohydrate content and serving sizes of the foods you want to eat. How to get more free foods in your diet A "free food" is any food or drink that has less than 5 grams of carbohydrates and fewer than 20 calories per serving. These foods are high in fiber and nutrients and low in calories, carbohydrates, and fats. Free foods include: Non-starchy vegetables, such as carrots, broccoli, celery, lettuce, or green beans. Non-sugar drinks, such as water, unsweetened coffee, or unsweetened tea. Low-calorie salad dressings. Sugar-free gelatin. Starting meals with a salad full of vegetables  is a healthy choice that includes a lot of free foods. Avoid high-calorie salad toppings, such as bacon, cheese, and high-fat dressings. Ask for your salad dressing to be served on the side so that you dip your fork in the dressing and then in the salad. This allows you to control how much dressing you eat and still get the flavor with every bite. How to control the amount of carbohydrates you have  Ask your server to take away the bread basket or chips from your table. Choose light yogurt or [place] yogurt instead of nonfat sweetened yogurt. Order fresh fruit. A salad bar often offers fresh fruit choices. Avoid canned fruit because it is usually packed in sugar or syrup. Order a salad and ask for dressing on the side. Ask for substitutes. For example, if your meal comes with french fries, ask for a side salad or steamed veggies instead. If a meal comes with fried chicken, ask for grilled chicken instead. How to know which beverages to choose or avoid Choose drinks that are low in calories and sugar, such as: Water. Unsweetened tea or coffee. Low-fat milk. Avoid the following drinks: Alcoholic beverages. Regular (not diet) sodas. General tips If you take insulin, wait to take your insulin until your food arrives to your table. This will ensure that your insulin and your food are timed correctly. Become familiar with serving sizes and learn to recognize how many servings are in a portion. Restaurant portions are typically two to three  times larger than what you really need. Ask your server for a to-go box at the beginning of the meal. When your food comes, leave the amount you should have on your plate, and put the rest in the to-go box so that you are not tempted to eat too much. Consider splitting an entre with someone and ordering a side salad. Avoid buffets. They are typically too tempting and result in overeating. Where to find more information American Diabetes Association:  diabetes.org Association of Diabetes Care & Education Specialists: diabeteseducator.org Academy of Nutrition and Dietetics: eatright.org Summary If you have diabetes, plan ahead when eating away from home. Try to eat your meals and snacks at about the same time each day. If you know your meal is going to be later than normal, make sure you have a small snack. Being very hungry can cause you to make unhealthy food choices. To control the amount of carbohydrates you have, ask for substitutes. For example, if your meal comes with french fries, ask for a side salad or steamed veggies instead. If a meal comes with fried chicken, ask for grilled chicken instead. Ask for a to-go box when you order your meal. Divide your meal before you start eating. This information is not intended to replace advice given to you by your health care provider. Make sure you discuss any questions you have with your health care provider. Document Revised: 02/22/2020 Document Reviewed: 02/22/2020 Elsevier Patient Education  2023 [employer].

## 2022-02-21 NOTE — Progress Notes (Signed)
Subjective:  Patient ID: Brian Reid, male    DOB: 1975-11-19  Age: 46 y.o. MRN: [id number]  CC:  Chief Complaint  Patient presents with   Diabetes    HPI Brian Reid presents for   Diabetes: With history of hyperglycemia.  Significantly improved A1c at his April visit, with use of metformin 500 mg twice daily at that time.  No diarrhea or other side effects of metformin. Only rare diarrhea - once every 2 months.  Sometimes misses med with traveling - misses 2nd dose about 1-2 times per week.  Home readings: none recent.  Traveling past 3 months - difficulty with diet choices. Less travel starting in August.  Microalbumin: Normal ratio 05/17/2021 Optho, foot exam, pneumovax: up to date.   Lab Results  Component Value Date   HGBA1C 7.3 (A) 02/21/2022   HGBA1C 6.6 (H) 11/22/2021   HGBA1C 8.8 (H) 07/19/2021   Lab Results  Component Value Date   MICROALBUR <0.7 05/17/2021   LDLCALC 113 (H) 07/19/2021   CREATININE 0.94 11/22/2021   Hyperlipidemia: recommended statin previously, declined.  Discussed statin recommendation with diabetes, or intermittent dosing. Still declines meds. Has made some diet changes, increased activity.  Declines statin or repeat testing today. Will have done next visit.  Wt Readings from Last 3 Encounters:  02/21/22 232 lb 12.8 oz (105.6 kg)  11/22/21 227 lb 12.8 oz (103.3 kg)  09/09/21 228 lb (103.4 kg)    The 10-year ASCVD risk score (Arnett DK, et al., 2019) is: 6.5%   Values used to calculate the score:     Age: 62 years     Sex: Male     Is Non-Hispanic African American: No     Diabetic: Yes     Tobacco smoker: No     Systolic Blood Pressure: 128 mmHg     Is BP treated: No     HDL Cholesterol: 36.4 mg/dL     Total Cholesterol: 204 mg/dL  Lab Results  Component Value Date   CHOL 204 (H) 11/22/2021   HDL 36.40 (L) 11/22/2021   LDLCALC 113 (H) 07/19/2021   LDLDIRECT 141.0 11/22/2021   TRIG 218.0 (H) 11/22/2021   CHOLHDL 6 11/22/2021    Lab Results  Component Value Date   ALT 28 11/22/2021   AST 19 11/22/2021   ALKPHOS 53 11/22/2021   BILITOT 0.7 11/22/2021      History Patient Active Problem List   Diagnosis Date Noted   Pain in left hand 09/09/2021   Left forearm pain 04/12/2021   Acute appendicitis s/p lap appendectomy 04/02/2016 04/03/2016   Past Medical History:  Diagnosis Date   Asthma    Past Surgical History:  Procedure Laterality Date   APPENDECTOMY N/A    [employer]rees[employer] 06/27/2020   LAPAROSCOPIC APPENDECTOMY N/A 04/02/2016   Procedure: APPENDECTOMY LAPAROSCOPIC;  Reid: Chevis Pretty III, MD;  Location: [facility];  Service: General;  Laterality: N/A;   LEG SURGERY     age 43   OPEN REDUCTION SHOULDER DISLOCATION     age 39   No Known Allergies Prior to Admission medications   Medication Sig Start Date End Date Taking? Authorizing Provider  metFORMIN (GLUCOPHAGE) 500 MG tablet Take 1 tablet (500 mg total) by mouth 2 (two) times daily with a meal. 08/22/21  Yes [name], MD   Social History   Socioeconomic History   Marital status: Married    Spouse name: Pattricia Boss   Number of children: 2   Years of  education: post grad   Highest education level: Not on file  Occupational History    Comment: IT professional  Tobacco Use   Smoking status: Former   Smokeless tobacco: Never   Tobacco comments:    03/05/16 "very occasional now", 06/06/16 not smoking  Vaping Use   Vaping Use: Never used  Substance and Sexual Activity   Alcohol use: Yes    Alcohol/week: 2.0 standard drinks of alcohol    Types: 2 Standard drinks or equivalent per week    Comment: social   Drug use: No   Sexual activity: Yes  Other Topics Concern   Not on file  Social History Narrative   Lives with family   Drinks 1 cup of coffee a day    Social Determinants of Health   Financial Resource Strain: Not on file  Food Insecurity: Not on file  Transportation Needs: Not on file  Physical Activity: Not on file  Stress: Not  on file  Social Connections: Not on file  Intimate Partner Violence: Not on file    Review of Systems  Constitutional:  Negative for fatigue and unexpected weight change.  Eyes:  Negative for visual disturbance.  Respiratory:  Negative for cough, chest tightness and shortness of breath.   Cardiovascular:  Negative for chest pain, palpitations and leg swelling.  Gastrointestinal:  Negative for abdominal pain and blood in stool.  Genitourinary:  Negative for frequency.  Neurological:  Negative for dizziness, light-headedness and headaches.     Objective:   Vitals:   02/21/22 1018  BP: 128/78  Pulse: (!) 56  Resp: 19  Temp: 98.2 F (36.8 C)  TempSrc: Oral  SpO2: 96%  Weight: 232 lb 12.8 oz (105.6 kg)  Height: 5\' 10"  (1.778 m)     Physical Exam Vitals reviewed.  Constitutional:      Appearance: He is well-developed.  HENT:     Head: Normocephalic and atraumatic.  Neck:     Vascular: No carotid bruit or JVD.  Cardiovascular:     Rate and Rhythm: Normal rate and regular rhythm.     Heart sounds: Normal heart sounds. No murmur heard. Pulmonary:     Effort: Pulmonary effort is normal.     Breath sounds: Normal breath sounds. No rales.  Musculoskeletal:     Right lower leg: No edema.     Left lower leg: No edema.  Skin:    General: Skin is warm and dry.  Neurological:     Mental Status: He is alert and oriented to person, place, and time.  Psychiatric:        Mood and Affect: Mood normal.     Results for orders placed or performed in visit on 02/21/22  POCT glycosylated hemoglobin (Hb A1C)  Result Value Ref Range   Hemoglobin A1C 7.3 (A) 4.0 - 5.6 %   HbA1c POC (<> result, manual entry)     HbA1c, POC (prediabetic range)     HbA1c, POC (controlled diabetic range)    POCT glucose (manual entry)  Result Value Ref Range   POC Glucose 142 (A) 70 - 99 mg/dl      Assessment & Plan:  Brian Reid is a 46 y.o. male . Diabetes mellitus without complication  (HCC) - Plan: POCT glycosylated hemoglobin (Hb A1C), POCT glucose (manual entry), metFORMIN (GLUCOPHAGE) 500 MG tablet  Hyperlipidemia, unspecified hyperlipidemia type  Slight decreased control with elevated A1c, likely due to difficulty with diet recently with significant travel.  Tolerating metformin overall, will  continue same dose for now, watch food choices, handout given on tips for eating away from home with diabetes.  less travel planned next few months.  We discussed statin again, declines at this time, would like to see lipids at next visit first.  Option of intermittent statin also discussed.  Recheck A1c and lipids at physical in 3 months.  Meds ordered this encounter  Medications   metFORMIN (GLUCOPHAGE) 500 MG tablet    Sig: Take 1 tablet (500 mg total) by mouth 2 (two) times daily with a meal.    Dispense:  180 tablet    Refill:  1   Patient Instructions  No medication changes at this time.  See information below on food choices when you are traveling.  Let me know if you change your mind about the cholesterol medication but we will recheck those labs at next visit.  Take care.   Tips for Eating Away From Home If You Have Diabetes Mellitus Managing your blood sugar (glucose) levels can be challenging when you do not prepare your own meals. The following tips can help you manage your diabetes when you eat away from home. If you have questions or if you need help, work with your health care provider or dietitian. How to plan ahead Plan ahead if you know you will be eating away from home. To do this: Try to eat your meals and snacks at about the same time each day. If you know your meal is going to be later than normal, make sure you have a small snack. Being very hungry can cause you to make unhealthy food choices. Make a list of restaurants near you that offer healthy choices. If a restaurant has a Mudlogger, take the menu home and plan what you will order ahead of time. Look  up the restaurant you want to eat at online. Many chain and fast-food restaurants list nutritional information online. Use this information to choose the healthiest options and to calculate how many carbohydrates will be in your meal. Use a carbohydrate-counting book or mobile app to look up the carbohydrate content and serving sizes of the foods you want to eat. How to get more free foods in your diet A "free food" is any food or drink that has less than 5 grams of carbohydrates and fewer than 20 calories per serving. These foods are high in fiber and nutrients and low in calories, carbohydrates, and fats. Free foods include: Non-starchy vegetables, such as carrots, broccoli, celery, lettuce, or green beans. Non-sugar drinks, such as water, unsweetened coffee, or unsweetened tea. Low-calorie salad dressings. Sugar-free gelatin. Starting meals with a salad full of vegetables is a healthy choice that includes a lot of free foods. Avoid high-calorie salad toppings, such as bacon, cheese, and high-fat dressings. Ask for your salad dressing to be served on the side so that you dip your fork in the dressing and then in the salad. This allows you to control how much dressing you eat and still get the flavor with every bite. How to control the amount of carbohydrates you have  Ask your server to take away the bread basket or chips from your table. Choose light yogurt or [place] yogurt instead of nonfat sweetened yogurt. Order fresh fruit. A salad bar often offers fresh fruit choices. Avoid canned fruit because it is usually packed in sugar or syrup. Order a salad and ask for dressing on the side. Ask for substitutes. For example, if your meal comes with french  fries, ask for a side salad or steamed veggies instead. If a meal comes with fried chicken, ask for grilled chicken instead. How to know which beverages to choose or avoid Choose drinks that are low in calories and sugar, such as: Water. Unsweetened  tea or coffee. Low-fat milk. Avoid the following drinks: Alcoholic beverages. Regular (not diet) sodas. General tips If you take insulin, wait to take your insulin until your food arrives to your table. This will ensure that your insulin and your food are timed correctly. Become familiar with serving sizes and learn to recognize how many servings are in a portion. Restaurant portions are typically two to three times larger than what you really need. Ask your server for a to-go box at the beginning of the meal. When your food comes, leave the amount you should have on your plate, and put the rest in the to-go box so that you are not tempted to eat too much. Consider splitting an entre with someone and ordering a side salad. Avoid buffets. They are typically too tempting and result in overeating. Where to find more information American Diabetes Association: diabetes.org Association of Diabetes Care & Education Specialists: diabeteseducator.org Academy of Nutrition and Dietetics: eatright.org Summary If you have diabetes, plan ahead when eating away from home. Try to eat your meals and snacks at about the same time each day. If you know your meal is going to be later than normal, make sure you have a small snack. Being very hungry can cause you to make unhealthy food choices. To control the amount of carbohydrates you have, ask for substitutes. For example, if your meal comes with french fries, ask for a side salad or steamed veggies instead. If a meal comes with fried chicken, ask for grilled chicken instead. Ask for a to-go box when you order your meal. Divide your meal before you start eating. This information is not intended to replace advice given to you by your health care provider. Make sure you discuss any questions you have with your health care provider. Document Revised: 02/22/2020 Document Reviewed: 02/22/2020 Elsevier Patient Education  2023 Elsevier Inc.     Signed,    Meredith Staggers, MD [facility], [facility] 02/21/22 10:47 AM

## 2022-05-30 ENCOUNTER — Encounter: Payer: Managed Care, Other (non HMO) | Admitting: Family Medicine

## 2022-07-31 ENCOUNTER — Encounter: Payer: Self-pay | Admitting: Family Medicine

## 2022-07-31 ENCOUNTER — Ambulatory Visit (INDEPENDENT_AMBULATORY_CARE_PROVIDER_SITE_OTHER): Payer: Managed Care, Other (non HMO) | Admitting: Family Medicine

## 2022-07-31 VITALS — BP 120/76 | HR 64 | Temp 98.4°F | Ht 70.0 in | Wt 231.6 lb

## 2022-07-31 DIAGNOSIS — E781 Pure hyperglyceridemia: Secondary | ICD-10-CM

## 2022-07-31 DIAGNOSIS — E785 Hyperlipidemia, unspecified: Secondary | ICD-10-CM | POA: Diagnosis not present

## 2022-07-31 DIAGNOSIS — E119 Type 2 diabetes mellitus without complications: Secondary | ICD-10-CM

## 2022-07-31 DIAGNOSIS — Z0000 Encounter for general adult medical examination without abnormal findings: Secondary | ICD-10-CM

## 2022-07-31 MED ORDER — METFORMIN HCL 500 MG PO TABS: 500.0000 mg | ORAL_TABLET | Freq: Two times a day (BID) | ORAL | 1 refills | Status: DC

## 2022-07-31 NOTE — Progress Notes (Addendum)
Subjective:  Patient ID: Brian Reid, male    DOB: Dec 15, 1975  Age: 46 y.o. MRN: [id number]  CC:  Chief Complaint  Patient presents with   Annual Exam    Pt states all is well    HPI Brian Reid presents for Annual Exam  Diabetes: Metformin 500 mg daily. Some diarrhea with BID dosing.  Home readings on occasion - fasting 136 No lows . Microalbumin: at lab visit.  No current statin. Declines. Some decreased diet adherence with holidays past month.  Last ate 2 hours ago - will return for fasting lab visit next week.  Lab Results  Component Value Date   HGBA1C 7.3 (A) 02/21/2022   HGBA1C 6.6 (H) 11/22/2021   HGBA1C 8.8 (H) 07/19/2021   Lab Results  Component Value Date   MICROALBUR <0.7 05/17/2021   LDLCALC 113 (H) 07/19/2021   CREATININE 0.94 11/22/2021        07/31/2022    2:00 PM 02/21/2022   10:20 AM 11/22/2021    9:21 AM 08/22/2021   11:47 AM 05/17/2021    8:43 AM  Depression screen PHQ 2/9  Decreased Interest 0 0 0 0 0  Down, Depressed, Hopeless 0 0 0 0 0  PHQ - 2 Score 0 0 0 0 0  Altered sleeping 0    0  Tired, decreased energy 0    1  Change in appetite 0    0  Feeling bad or failure about yourself  0    0  Trouble concentrating 0    0  Moving slowly or fidgety/restless 0    0  Suicidal thoughts 0    0  PHQ-9 Score 0    1    Health Maintenance  Topic Date Due   Diabetic kidney evaluation - Urine ACR  05/17/2022   COVID-19 Vaccine (5 - 2023-24 season) 08/16/2022 (Originally 04/04/2022)   INFLUENZA VACCINE  11/02/2022 (Originally 03/04/2022)   Hepatitis C Screening  11/23/2022 (Originally 12/04/1993)   COLONOSCOPY (Pts 45-76yr Insurance coverage will need to be confirmed)  08/01/2023 (Originally 12/04/2020)   Diabetic kidney evaluation - eGFR measurement  11/23/2022   HIV Screening  Completed   HPV VACCINES  Aged Out   DTaP/Tdap/Td  Discontinued  Cologuard in 2022.  Prostate: does not have family history of prostate cancer The natural history of  prostate cancer and ongoing controversy regarding screening and potential treatment outcomes of prostate cancer has been discussed with the patient. The meaning of a false positive PSA and a false negative PSA has been discussed. He indicates understanding of the limitations of this screening test and wishes NOT to proceed with screening PSA testing. No results found for: "PSA1", "PSA"    Immunization History  Administered Date(s) Administered   PFIZER Comirnaty(Gray Top)Covid-19 Tri-Sucrose Vaccine 11/15/2019, 12/06/2019, 07/31/2020   PFIZER(Purple Top)SARS-COV-2 Vaccination 07/31/2020  Covid booster - declines.  Flu vaccine declines.  Hep C test - agrees.   No results found. Due - will schedule optho eval.    Dental: yearly.   Alcohol:none.   Tobacco: none  Exercise: cardio, pushups, 3 days per week.    History Patient Active Problem List   Diagnosis Date Noted   Pain in left hand 09/09/2021   Left forearm pain 04/12/2021   Acute appendicitis s/p lap appendectomy 04/02/2016 04/03/2016   Past Medical History:  Diagnosis Date   Asthma    Past Surgical History:  Procedure Laterality Date   APPENDECTOMY N/A    [facility]reesia 06/27/2020  LAPAROSCOPIC APPENDECTOMY N/A 04/02/2016   Procedure: APPENDECTOMY LAPAROSCOPIC;  Surgeon: Autumn Messing III, MD;  Location: [facility];  Service: General;  Laterality: N/A;   LEG SURGERY     age 64   OPEN REDUCTION SHOULDER DISLOCATION     age 41   No Known Allergies Prior to Admission medications   Medication Sig Start Date End Date Taking? Authorizing Provider  metFORMIN (GLUCOPHAGE) 500 MG tablet Take 1 tablet (500 mg total) by mouth 2 (two) times daily with a meal. 02/21/22  Yes Wendie Agreste, MD   Social History   Socioeconomic History   Marital status: Married    Spouse name: Deneise Lever   Number of children: 2   Years of education: post grad   Highest education level: Not on file  Occupational History    Comment: IT professional   Tobacco Use   Smoking status: Former   Smokeless tobacco: Never   Tobacco comments:    03/05/16 "very occasional now", 06/06/16 not smoking  Vaping Use   Vaping Use: Never used  Substance and Sexual Activity   Alcohol use: Yes    Alcohol/week: 2.0 standard drinks of alcohol    Types: 2 Standard drinks or equivalent per week    Comment: social   Drug use: No   Sexual activity: Yes  Other Topics Concern   Not on file  Social History Narrative   Lives with family   Drinks 1 cup of coffee a day    Social Determinants of Health   Financial Resource Strain: Not on file  Food Insecurity: Not on file  Transportation Needs: Not on file  Physical Activity: Not on file  Stress: Not on file  Social Connections: Not on file  Intimate Partner Violence: Not on file    Review of Systems  13 point review of systems per patient health survey noted.  Negative other than as indicated above or in HPI.   Objective:   Vitals:   07/31/22 1402  BP: 120/76  Pulse: 64  Temp: 98.4 F (36.9 C)  SpO2: 97%  Weight: 231 lb 9.6 oz (105.1 kg)  Height: _0  (1.778 m)     Physical Exam Vitals reviewed.  Constitutional:      Appearance: He is well-developed.  HENT:     Head: Normocephalic and atraumatic.     Right Ear: External ear normal.     Left Ear: External ear normal.  Eyes:     Conjunctiva/sclera: Conjunctivae normal.     Pupils: Pupils are equal, round, and reactive to light.  Neck:     Thyroid: No thyromegaly.     Vascular: No carotid bruit or JVD.  Cardiovascular:     Rate and Rhythm: Normal rate and regular rhythm.     Heart sounds: Normal heart sounds. No murmur heard. Pulmonary:     Effort: Pulmonary effort is normal. No respiratory distress.     Breath sounds: Normal breath sounds. No wheezing or rales.  Abdominal:     General: There is no distension.     Palpations: Abdomen is soft.     Tenderness: There is no abdominal tenderness.  Musculoskeletal:         General: No tenderness. Normal range of motion.     Cervical back: Normal range of motion and neck supple.     Right lower leg: No edema.     Left lower leg: No edema.  Lymphadenopathy:     Cervical: No cervical adenopathy.  Skin:  General: Skin is warm and dry.  Neurological:     Mental Status: He is alert and oriented to person, place, and time.     Deep Tendon Reflexes: Reflexes are normal and symmetric.  Psychiatric:        Mood and Affect: Mood normal.        Behavior: Behavior normal.        Assessment & Plan:  Abie Cheek is a 46 y.o. male . Annual physical exam  Diabetes mellitus without complication ([place]) - Plan: metFORMIN (GLUCOPHAGE) 500 MG tablet, Microalbumin / creatinine urine ratio, Comprehensive metabolic panel, Lipid panel, Hemoglobin A1c  Hypertriglyceridemia  Hyperlipidemia, unspecified hyperlipidemia type - Plan: Comprehensive metabolic panel, Lipid panel  -anticipatory guidance as below in AVS, screening labs above. Health maintenance items as above in HPI discussed/recommended as applicable.  -Some intolerance to twice daily dosing of metformin, daily dosing for now we will check of A1c as above.  Plans lab visit.  Option to change to other agent including SGLT2 or GLP-1 depending on A1c result.  We discussed hyperlipidemia and statin recommendations with diabetes, declines at present.  Check updated labs but some decreased diet adherence recently.  Meds ordered this encounter  Medications   metFORMIN (GLUCOPHAGE) 500 MG tablet    Sig: Take 1 tablet (500 mg total) by mouth 2 (two) times daily with a meal. 1-2 times per day as tolerated.    Dispense:  180 tablet    Refill:  1   Patient Instructions  Preventive Care 63-69 Years Old, Male Preventive care refers to lifestyle choices and visits with your health care provider that can promote health and wellness. Preventive care visits are also called wellness exams. What can I expect for my preventive  care visit? Counseling During your preventive care visit, your health care provider may ask about your: Medical history, including: Past medical problems. Family medical history. Current health, including: Emotional well-being. Home life and relationship well-being. Sexual activity. Lifestyle, including: Alcohol, nicotine or tobacco, and drug use. Access to firearms. Diet, exercise, and sleep habits. Safety issues such as seatbelt and bike helmet use. Sunscreen use. Work and work Statistician. Physical exam Your health care provider will check your: Height and weight. These may be used to calculate your BMI (body mass index). BMI is a measurement that tells if you are at a healthy weight. Waist circumference. This measures the distance around your waistline. This measurement also tells if you are at a healthy weight and may help predict your risk of certain diseases, such as type 2 diabetes and high blood pressure. Heart rate and blood pressure. Body temperature. Skin for abnormal spots. What immunizations do I need?  Vaccines are usually given at various ages, according to a schedule. Your health care provider will recommend vaccines for you based on your age, medical history, and lifestyle or other factors, such as travel or where you work. What tests do I need? Screening Your health care provider may recommend screening tests for certain conditions. This may include: Lipid and cholesterol levels. Diabetes screening. This is done by checking your blood sugar (glucose) after you have not eaten for a while (fasting). Hepatitis B test. Hepatitis C test. HIV (human immunodeficiency virus) test. STI (sexually transmitted infection) testing, if you are at risk. Lung cancer screening. Prostate cancer screening. Colorectal cancer screening. Talk with your health care provider about your test results, treatment options, and if necessary, the need for more tests. Follow these  instructions at home: Eating and  drinking  Eat a diet that includes fresh fruits and vegetables, whole grains, lean protein, and low-fat dairy products. Take vitamin and mineral supplements as recommended by your health care provider. Do not drink alcohol if your health care provider tells you not to drink. If you drink alcohol: Limit how much you have to 0-2 drinks a day. Know how much alcohol is in your drink. In the [place] one drink equals one 12 oz bottle of beer (355 mL), one 5 oz glass of wine (148 mL), or one 1 oz glass of hard liquor (44 mL). Lifestyle Brush your teeth every morning and night with fluoride toothpaste. Floss one time each day. Exercise for at least 30 minutes 5 or more days each week. Do not use any products that contain nicotine or tobacco. These products include cigarettes, chewing tobacco, and vaping devices, such as e-cigarettes. If you need help quitting, ask your health care provider. Do not use drugs. If you are sexually active, practice safe sex. Use a condom or other form of protection to prevent STIs. Take aspirin only as told by your health care provider. Make sure that you understand how much to take and what form to take. Work with your health care provider to find out whether it is safe and beneficial for you to take aspirin daily. Find healthy ways to manage stress, such as: Meditation, yoga, or listening to music. Journaling. Talking to a trusted person. Spending time with friends and family. Minimize exposure to UV radiation to reduce your risk of skin cancer. Safety Always wear your seat belt while driving or riding in a vehicle. Do not drive: If you have been drinking alcohol. Do not ride with someone who has been drinking. When you are tired or distracted. While texting. If you have been using any mind-altering substances or drugs. Wear a helmet and other protective equipment during sports activities. If you have firearms in your house, make  sure you follow all gun safety procedures. What's next? Go to your health care provider once a year for an annual wellness visit. Ask your health care provider how often you should have your eyes and teeth checked. Stay up to date on all vaccines. This information is not intended to replace advice given to you by your health care provider. Make sure you discuss any questions you have with your health care provider. Document Revised: 01/16/2021 Document Reviewed: 01/16/2021 Elsevier Patient Education  [place],   Merri Ray, MD [facility] 07/31/22 2:48 PM

## 2022-07-31 NOTE — Patient Instructions (Signed)

## 2022-08-08 ENCOUNTER — Other Ambulatory Visit: Payer: Managed Care, Other (non HMO)

## 2022-08-12 ENCOUNTER — Ambulatory Visit: Payer: Managed Care, Other (non HMO) | Admitting: Family

## 2022-08-14 ENCOUNTER — Ambulatory Visit: Payer: Managed Care, Other (non HMO) | Admitting: Family Medicine

## 2022-08-14 ENCOUNTER — Encounter: Payer: Self-pay | Admitting: Family Medicine

## 2022-08-14 VITALS — BP 130/78 | HR 65 | Temp 98.3°F | Ht 70.0 in | Wt 229.6 lb

## 2022-08-14 DIAGNOSIS — R052 Subacute cough: Secondary | ICD-10-CM

## 2022-08-14 DIAGNOSIS — J22 Unspecified acute lower respiratory infection: Secondary | ICD-10-CM | POA: Diagnosis not present

## 2022-08-14 MED ORDER — BENZONATATE 100 MG PO CAPS: 100.0000 mg | ORAL_CAPSULE | Freq: Three times a day (TID) | ORAL | 0 refills | Status: DC | PRN

## 2022-08-14 MED ORDER — AZITHROMYCIN 250 MG PO TABS: ORAL_TABLET | ORAL | 0 refills | Status: AC

## 2022-08-14 NOTE — Patient Instructions (Signed)
Okay to continue Mucinex, Tessalon Perles up to 3 times per day as needed for cough.  Continue to drink plenty of fluids.  See other information below on management of cough.  Azithromycin antibiotic sent to the pharmacy.  I expect you to be improving into next week.  Follow-up if any new or worsening symptoms as we discussed.  Thanks for coming in today.   Cough, Adult Coughing is a reflex that clears your throat and your airways (respiratory system). Coughing helps to heal and protect your lungs. It is normal to cough occasionally, but a cough that happens with other symptoms or lasts a long time may be a sign of a condition that needs treatment. An acute cough may only last 2-3 weeks, while a chronic cough may last 8 or more weeks. Coughing is commonly caused by: Infection of the respiratory systemby viruses or bacteria. Breathing in substances that irritate your lungs. Allergies. Asthma. Mucus that runs down the back of your throat (postnasal drip). Smoking. Acid backing up from the stomach into the esophagus (gastroesophageal reflux). Certain medicines. Chronic lung problems. Other medical conditions such as heart failure or a blood clot in the lung (pulmonary embolism). Follow these instructions at home: Medicines Take over-the-counter and prescription medicines only as told by your health care provider. Talk with your health care provider before you take a cough suppressant medicine. Lifestyle Avoid cigarette smoke. Do not use any products that contain nicotine or tobacco, such as cigarettes, e-cigarettes, and chewing tobacco. If you need help quitting, ask your health care provider. Drink enough fluid to keep your urine pale yellow. Avoid caffeine. Do not drink alcohol if your health care provider tells you not to drink. General instructions  Pay close attention to changes in your cough. Tell your health care provider about them. Always cover your mouth when you cough. Avoid  things that make you cough, such as perfume, candles, cleaning products, or campfire or tobacco smoke. If the air is dry, use a cool mist vaporizer or humidifier in your bedroom or your home to help loosen secretions. If your cough is worse at night, try to sleep in a semi-upright position. Rest as needed. Keep all follow-up visits as told by your health care provider. This is important. Contact a health care provider if you: Have new symptoms. Cough up pus. Have a cough that does not get better after 2-3 weeks or gets worse. Cannot control your cough with cough suppressant medicines and you are losing sleep. Have pain that gets worse or pain that is not helped with medicine. Have a fever. Have unexplained weight loss. Have night sweats. Get help right away if: You cough up blood. You have difficulty breathing. Your heartbeat is very fast. These symptoms may represent a serious problem that is an emergency. Do not wait to see if the symptoms will go away. Get medical help right away. Call your local emergency services (911 in the [place] Do not drive yourself to the hospital. Summary Coughing is a reflex that clears your throat and your airways. It is normal to cough occasionally, but a cough that happens with other symptoms or lasts a long time may be a sign of a condition that needs treatment. Take over-the-counter and prescription medicines only as told by your health care provider. Always cover your mouth when you cough. Contact a health care provider if you have new symptoms or a cough that does not get better after 2-3 weeks or gets worse. This information is  not intended to replace advice given to you by your health care provider. Make sure you discuss any questions you have with your health care provider. Document Revised: 12/24/2021 Document Reviewed: 08/09/2018 Elsevier Patient Education  [place].

## 2022-08-14 NOTE — Progress Notes (Signed)
Subjective:  Patient ID: Brian Reid, male    DOB: 12/14/1975  Age: 47 y.o. MRN: [id number]  CC:  Chief Complaint  Patient presents with   Cough    Ongoing and severe, dry cough. Has been using Mucinex Fastmed. Medication used since 3rd week of Dec until now.    HPI Hasan Douse presents for    Cough: Started 3 weeks ago, fever for 1 day, bodyache, HA, no covid test or medical eval. Fever then subsided with tylenol, mucinex, cough improved after about a week. No known sick contacts.  Cough returned 10 days ago. General malaise, body ache at times. No fever.  Green productive phlegm at times, no nasal congestion. Neg covid test 2 days ago. Sometimes dry cough. Some coughing fits. No cough syncope/near-syncope or emesis. No recent fever.  Tx: mucinex BID - unsure if help. Some tylenol at times.  No recent sick contacts.  Drinking fluids, eating ok. No CP/dyspnea.    History Patient Active Problem List   Diagnosis Date Noted   Pain in left hand 09/09/2021   Left forearm pain 04/12/2021   Acute appendicitis s/p lap appendectomy 04/02/2016 04/03/2016   Past Medical History:  Diagnosis Date   Asthma    Past Surgical History:  Procedure Laterality Date   APPENDECTOMY N/A    [facility] 06/27/2020   LAPAROSCOPIC APPENDECTOMY N/A 04/02/2016   Procedure: APPENDECTOMY LAPAROSCOPIC;  Surgeon: Autumn Messing III, MD;  Location: [facility];  Service: General;  Laterality: N/A;   LEG SURGERY     age 26   OPEN REDUCTION SHOULDER DISLOCATION     age 77   No Known Allergies Prior to Admission medications   Medication Sig Start Date End Date Taking? Authorizing Provider  metFORMIN (GLUCOPHAGE) 500 MG tablet Take 1 tablet (500 mg total) by mouth 2 (two) times daily with a meal. 1-2 times per day as tolerated. 07/31/22  Yes Wendie Agreste, MD   Social History   Socioeconomic History   Marital status: Married    Spouse name: Deneise Lever   Number of children: 2   Years of education: post grad    Highest education level: Not on file  Occupational History    Comment: IT professional  Tobacco Use   Smoking status: Former   Smokeless tobacco: Never   Tobacco comments:    03/05/16 "very occasional now", 06/06/16 not smoking  Vaping Use   Vaping Use: Never used  Substance and Sexual Activity   Alcohol use: Yes    Alcohol/week: 2.0 standard drinks of alcohol    Types: 2 Standard drinks or equivalent per week    Comment: social   Drug use: No   Sexual activity: Yes  Other Topics Concern   Not on file  Social History Narrative   Lives with family   Drinks 1 cup of coffee a day    Social Determinants of Health   Financial Resource Strain: Not on file  Food Insecurity: Not on file  Transportation Needs: Not on file  Physical Activity: Not on file  Stress: Not on file  Social Connections: Not on file  Intimate Partner Violence: Not on file    Review of Systems Per HPI.   Objective:   Vitals:   08/14/22 1024  BP: 130/78  Pulse: 65  Temp: 98.3 F (36.8 C)  SpO2: 99%  Weight: 229 lb 9.6 oz (104.1 kg)  Height: 5\' 10"  (1.778 m)     Physical Exam Vitals reviewed.  Constitutional:  Appearance: He is well-developed.  HENT:     Head: Normocephalic and atraumatic.     Right Ear: Tympanic membrane, ear canal and external ear normal.     Left Ear: Tympanic membrane, ear canal and external ear normal.     Nose: No rhinorrhea.     Mouth/Throat:     Pharynx: No oropharyngeal exudate or posterior oropharyngeal erythema.  Eyes:     Conjunctiva/sclera: Conjunctivae normal.     Pupils: Pupils are equal, round, and reactive to light.  Cardiovascular:     Rate and Rhythm: Normal rate and regular rhythm.     Heart sounds: Normal heart sounds. No murmur heard. Pulmonary:     Effort: Pulmonary effort is normal.     Breath sounds: Normal breath sounds. No wheezing, rhonchi or rales.  Abdominal:     Palpations: Abdomen is soft.     Tenderness: There is no abdominal  tenderness.  Musculoskeletal:     Cervical back: Neck supple.  Lymphadenopathy:     Cervical: No cervical adenopathy.  Skin:    General: Skin is warm and dry.     Findings: No rash.  Neurological:     Mental Status: He is alert and oriented to person, place, and time.  Psychiatric:        Behavior: Behavior normal.        Assessment & Plan:  Purcell Jungbluth is a 47 y.o. male . Subacute cough - Plan: benzonatate (TESSALON) 100 MG capsule  LRTI (lower respiratory tract infection) - Plan: azithromycin (ZITHROMAX) 250 MG tablet Initial likely viral illness with secondary cough, secondary infection versus worsening initial infection.  Lungs are clear, reassuring vital signs.  Symptomatic care with Tessalon Perles, Mucinex, fluids, start azithromycin with secondary sickening.  RTC precautions.  Potential side effects and risks of antibiotics discussed.  Meds ordered this encounter  Medications   benzonatate (TESSALON) 100 MG capsule    Sig: Take 1 capsule (100 mg total) by mouth 3 (three) times daily as needed for cough.    Dispense:  20 capsule    Refill:  0   azithromycin (ZITHROMAX) 250 MG tablet    Sig: Take 2 tablets on day 1, then 1 tablet daily on days 2 through 5    Dispense:  6 tablet    Refill:  0   Patient Instructions  Okay to continue Mucinex, Tessalon Perles up to 3 times per day as needed for cough.  Continue to drink plenty of fluids.  See other information below on management of cough.  Azithromycin antibiotic sent to the pharmacy.  I expect you to be improving into next week.  Follow-up if any new or worsening symptoms as we discussed.  Thanks for coming in today.   Cough, Adult Coughing is a reflex that clears your throat and your airways (respiratory system). Coughing helps to heal and protect your lungs. It is normal to cough occasionally, but a cough that happens with other symptoms or lasts a long time may be a sign of a condition that needs treatment. An acute  cough may only last 2-3 weeks, while a chronic cough may last 8 or more weeks. Coughing is commonly caused by: Infection of the respiratory systemby viruses or bacteria. Breathing in substances that irritate your lungs. Allergies. Asthma. Mucus that runs down the back of your throat (postnasal drip). Smoking. Acid backing up from the stomach into the esophagus (gastroesophageal reflux). Certain medicines. Chronic lung problems. Other medical conditions such as heart failure  or a blood clot in the lung (pulmonary embolism). Follow these instructions at home: Medicines Take over-the-counter and prescription medicines only as told by your health care provider. Talk with your health care provider before you take a cough suppressant medicine. Lifestyle Avoid cigarette smoke. Do not use any products that contain nicotine or tobacco, such as cigarettes, e-cigarettes, and chewing tobacco. If you need help quitting, ask your health care provider. Drink enough fluid to keep your urine pale yellow. Avoid caffeine. Do not drink alcohol if your health care provider tells you not to drink. General instructions  Pay close attention to changes in your cough. Tell your health care provider about them. Always cover your mouth when you cough. Avoid things that make you cough, such as perfume, candles, cleaning products, or campfire or tobacco smoke. If the air is dry, use a cool mist vaporizer or humidifier in your bedroom or your home to help loosen secretions. If your cough is worse at night, try to sleep in a semi-upright position. Rest as needed. Keep all follow-up visits as told by your health care provider. This is important. Contact a health care provider if you: Have new symptoms. Cough up pus. Have a cough that does not get better after 2-3 weeks or gets worse. Cannot control your cough with cough suppressant medicines and you are losing sleep. Have pain that gets worse or pain that is not  helped with medicine. Have a fever. Have unexplained weight loss. Have night sweats. Get help right away if: You cough up blood. You have difficulty breathing. Your heartbeat is very fast. These symptoms may represent a serious problem that is an emergency. Do not wait to see if the symptoms will go away. Get medical help right away. Call your local emergency services (911 in the [place] Do not drive yourself to the hospital. Summary Coughing is a reflex that clears your throat and your airways. It is normal to cough occasionally, but a cough that happens with other symptoms or lasts a long time may be a sign of a condition that needs treatment. Take over-the-counter and prescription medicines only as told by your health care provider. Always cover your mouth when you cough. Contact a health care provider if you have new symptoms or a cough that does not get better after 2-3 weeks or gets worse. This information is not intended to replace advice given to you by your health care provider. Make sure you discuss any questions you have with your health care provider. Document Revised: 12/24/2021 Document Reviewed: 08/09/2018 Elsevier Patient Education  2023 Elsevier Inc.     Signed,   Meredith Staggers, MD [facility], [facility] 08/14/22 10:58 AM

## 2022-08-15 ENCOUNTER — Other Ambulatory Visit (INDEPENDENT_AMBULATORY_CARE_PROVIDER_SITE_OTHER): Payer: Managed Care, Other (non HMO)

## 2022-08-15 DIAGNOSIS — E119 Type 2 diabetes mellitus without complications: Secondary | ICD-10-CM

## 2022-08-15 DIAGNOSIS — E785 Hyperlipidemia, unspecified: Secondary | ICD-10-CM

## 2022-08-15 LAB — LIPID PANEL
Cholesterol: 168 mg/dL (ref 0–200)
HDL: 25.3 mg/dL — ABNORMAL LOW (ref >39.00)
NonHDL: 142.37
Total CHOL/HDL Ratio: 7
Triglycerides: 333 mg/dL — ABNORMAL HIGH (ref 0.0–149.0)
VLDL: 66.6 mg/dL — ABNORMAL HIGH (ref 0.0–40.0)

## 2022-08-15 LAB — COMPREHENSIVE METABOLIC PANEL
ALT: 36 U/L (ref 0–53)
AST: 24 U/L (ref 0–37)
Albumin: 4.2 g/dL (ref 3.5–5.2)
Alkaline Phosphatase: 46 U/L (ref 39–117)
BUN: 10 mg/dL (ref 6–23)
CO2: 29 mEq/L (ref 19–32)
Calcium: 9 mg/dL (ref 8.4–10.5)
Chloride: 102 mEq/L (ref 96–112)
Creatinine, Ser: 0.97 mg/dL (ref 0.40–1.50)
GFR: 93.5 mL/min (ref >60.00)
Glucose, Bld: 141 mg/dL — ABNORMAL HIGH (ref 70–99)
Potassium: 4.1 mEq/L (ref 3.5–5.1)
Sodium: 139 mEq/L (ref 135–145)
Total Bilirubin: 0.5 mg/dL (ref 0.2–1.2)
Total Protein: 6.8 g/dL (ref 6.0–8.3)

## 2022-08-15 LAB — LDL CHOLESTEROL, DIRECT: Direct LDL: 90 mg/dL

## 2022-08-15 LAB — HEMOGLOBIN A1C: Hgb A1c MFr Bld: 7.4 % — ABNORMAL HIGH (ref 4.6–6.5)

## 2022-08-15 LAB — MICROALBUMIN / CREATININE URINE RATIO
Creatinine,U: 136.2 mg/dL
Microalb Creat Ratio: 0.5 mg/g (ref 0.0–30.0)
Microalb, Ur: <0.7 mg/dL (ref 0.0–1.9)

## 2022-09-04 NOTE — Progress Notes (Signed)
This encounter was created in error - please disregard.

## 2022-10-31 ENCOUNTER — Ambulatory Visit: Payer: Managed Care, Other (non HMO) | Admitting: Family Medicine

## 2023-03-16 ENCOUNTER — Ambulatory Visit: Payer: Managed Care, Other (non HMO) | Admitting: Family Medicine

## 2023-03-20 ENCOUNTER — Ambulatory Visit: Payer: Managed Care, Other (non HMO) | Admitting: Family Medicine

## 2023-03-20 ENCOUNTER — Encounter: Payer: Self-pay | Admitting: Family Medicine

## 2023-03-20 VITALS — BP 144/88 | HR 60 | Temp 98.1°F | Ht 70.0 in | Wt 235.4 lb

## 2023-03-20 DIAGNOSIS — E119 Type 2 diabetes mellitus without complications: Secondary | ICD-10-CM | POA: Diagnosis not present

## 2023-03-20 DIAGNOSIS — Z7984 Long term (current) use of oral hypoglycemic drugs: Secondary | ICD-10-CM

## 2023-03-20 DIAGNOSIS — R030 Elevated blood-pressure reading, without diagnosis of hypertension: Secondary | ICD-10-CM | POA: Diagnosis not present

## 2023-03-20 DIAGNOSIS — E781 Pure hyperglyceridemia: Secondary | ICD-10-CM

## 2023-03-20 LAB — COMPREHENSIVE METABOLIC PANEL
ALT: 49 U/L (ref 0–53)
AST: 27 U/L (ref 0–37)
Albumin: 4.5 g/dL (ref 3.5–5.2)
Alkaline Phosphatase: 51 U/L (ref 39–117)
BUN: 9 mg/dL (ref 6–23)
CO2: 31 meq/L (ref 19–32)
Calcium: 9.3 mg/dL (ref 8.4–10.5)
Chloride: 98 meq/L (ref 96–112)
Creatinine, Ser: 0.91 mg/dL (ref 0.40–1.50)
GFR: 100.52 mL/min (ref >60.00)
Glucose, Bld: 151 mg/dL — ABNORMAL HIGH (ref 70–99)
Potassium: 4.1 meq/L (ref 3.5–5.1)
Sodium: 135 meq/L (ref 135–145)
Total Bilirubin: 0.6 mg/dL (ref 0.2–1.2)
Total Protein: 6.9 g/dL (ref 6.0–8.3)

## 2023-03-20 LAB — LIPID PANEL
Cholesterol: 185 mg/dL (ref 0–200)
HDL: 31.6 mg/dL — ABNORMAL LOW (ref >39.00)
NonHDL: 153.84
Total CHOL/HDL Ratio: 6
Triglycerides: 313 mg/dL — ABNORMAL HIGH (ref 0.0–149.0)
VLDL: 62.6 mg/dL — ABNORMAL HIGH (ref 0.0–40.0)

## 2023-03-20 LAB — LDL CHOLESTEROL, DIRECT: Direct LDL: 119 mg/dL

## 2023-03-20 MED ORDER — METFORMIN HCL 500 MG PO TABS: 500.0000 mg | ORAL_TABLET | Freq: Two times a day (BID) | ORAL | 1 refills | Status: DC

## 2023-03-20 NOTE — Patient Instructions (Addendum)
I will check labs today and let you know if any medication changes needed.  Monitor blood pressure outside the office and if any readings over 130/80, let me know.  No new meds for now. Follow-up in 5 months for a physical.  Sooner if any new concerns or symptoms.  Take care!

## 2023-03-20 NOTE — Progress Notes (Signed)
Subjective:  Patient ID: Brian Reid, male    DOB: 07/11/1976  Age: 47 y.o. MRN: [id number]  CC:  Chief Complaint  Patient presents with   Medical Management of Chronic Issues    Pt is doing well no concerns     HPI Brian Reid presents for   Diabetes: Treated with metformin 500 mg BID. No recent diarrhea with twice daily dosing.  No home readings Statins have been discussed, declined. Exercise- up and down.  Diet - up and down regarding adherence.  More traveling for work.  Wt Readings from Last 3 Encounters:  03/20/23 235 lb 6.4 oz (106.8 kg)  08/14/22 229 lb 9.6 oz (104.1 kg)  07/31/22 231 lb 9.6 oz (105.1 kg)  No blurry vision, increased thirst or urinary frequency.   Microalbumin: normal in 08/2022.  Optho, foot exam, pneumovax: up to date.  Optho appt in September.   Lab Results  Component Value Date   HGBA1C 7.4 (H) 08/15/2022   HGBA1C 7.3 (A) 02/21/2022   HGBA1C 6.6 (H) 11/22/2021   Lab Results  Component Value Date   MICROALBUR <0.7 08/15/2022   LDLCALC 113 (H) 07/19/2021   CREATININE 0.97 08/15/2022   Elevated BP: No antihypertensives, did not sleep well last night. Usually sleeps well.  No HA, CP/dyspnea, or weakness, no symptoms.   BP Readings from Last 3 Encounters:  03/20/23 (!) 144/88  08/14/22 130/78  07/31/22 120/76     History Patient Active Problem List   Diagnosis Date Noted   Pain in left hand 09/09/2021   Left forearm pain 04/12/2021   Acute appendicitis s/p lap appendectomy 04/02/2016 04/03/2016   Past Medical History:  Diagnosis Date   Asthma    Past Surgical History:  Procedure Laterality Date   APPENDECTOMY N/A    [facility] 06/27/2020   LAPAROSCOPIC APPENDECTOMY N/A 04/02/2016   Procedure: APPENDECTOMY LAPAROSCOPIC;  Surgeon: Chevis Pretty III, MD;  Location: [facility];  Service: General;  Laterality: N/A;   LEG SURGERY     age 14   OPEN REDUCTION SHOULDER DISLOCATION     age 73   No Known Allergies Prior to Admission  medications   Medication Sig Start Date End Date Taking? Authorizing Provider  benzonatate (TESSALON) 100 MG capsule Take 1 capsule (100 mg total) by mouth 3 (three) times daily as needed for cough. Patient not taking: Reported on 03/20/2023 08/14/22   [name], MD  metFORMIN (GLUCOPHAGE) 500 MG tablet Take 1 tablet (500 mg total) by mouth 2 (two) times daily with a meal. 1-2 times per day as tolerated. 07/31/22   [name], MD   Social History   Socioeconomic History   Marital status: Married    Spouse name: Pattricia Boss   Number of children: 2   Years of education: post grad   Highest education level: Not on file  Occupational History    Comment: IT professional  Tobacco Use   Smoking status: Former   Smokeless tobacco: Never   Tobacco comments:    03/05/16 "very occasional now", 06/06/16 not smoking  Vaping Use   Vaping status: Never Used  Substance and Sexual Activity   Alcohol use: Yes    Alcohol/week: 2.0 standard drinks of alcohol    Types: 2 Standard drinks or equivalent per week    Comment: social   Drug use: No   Sexual activity: Yes  Other Topics Concern   Not on file  Social History Narrative   Lives with family  Drinks 1 cup of coffee a day    Social Determinants of Corporate investment banker Strain: Not on file  Food Insecurity: Not on file  Transportation Needs: Not on file  Physical Activity: Not on file  Stress: Not on file  Social Connections: Not on file  Intimate Partner Violence: Not on file    Review of Systems  Constitutional:  Negative for fatigue and unexpected weight change.  Eyes:  Negative for visual disturbance.  Respiratory:  Negative for cough, chest tightness and shortness of breath.   Cardiovascular:  Negative for chest pain, palpitations and leg swelling.  Gastrointestinal:  Negative for abdominal pain and blood in stool.  Neurological:  Negative for dizziness, light-headedness and headaches.     Objective:   Vitals:    03/20/23 1054 03/20/23 1136  BP: (!) 150/92 (!) 144/88  Pulse: 60   Temp: 98.1 F (36.7 C)   TempSrc: Temporal   SpO2: 99%   Weight: 235 lb 6.4 oz (106.8 kg)   Height: 5\' 10"  (1.778 m)      Physical Exam Vitals reviewed.  Constitutional:      Appearance: He is well-developed.  HENT:     Head: Normocephalic and atraumatic.  Neck:     Vascular: No carotid bruit or JVD.  Cardiovascular:     Rate and Rhythm: Normal rate and regular rhythm.     Heart sounds: Normal heart sounds. No murmur heard. Pulmonary:     Effort: Pulmonary effort is normal.     Breath sounds: Normal breath sounds. No rales.  Musculoskeletal:     Right lower leg: No edema.     Left lower leg: No edema.  Skin:    General: Skin is warm and dry.  Neurological:     Mental Status: He is alert and oriented to person, place, and time.  Psychiatric:        Mood and Affect: Mood normal.        Assessment & Plan:  Brian Reid is a 47 y.o. male . Elevated BP without diagnosis of hypertension - Plan: Comprehensive metabolic panel  -Normal readings, asymptomatic.  May have been related to decreased quality of sleep yesterday.  Monitoring with RTC precautions if elevations.  No meds for now.  Check labs above.  Diabetes mellitus without complication (HCC) - Plan: Comprehensive metabolic panel, Hemoglobin A1c, Lipid panel, metFORMIN (GLUCOPHAGE) 500 MG tablet  -Tolerating metformin, check A1c.  As above some room for improvement with exercise, diet, decrease adherence with travel.  Depending on A1c results can decide if adjustment of meds versus improved diet/exercise.  Hypertriglyceridemia - Plan: Comprehensive metabolic panel, Lipid panel  -Repeat labs, has declined statin/treatment previously.  Meds ordered this encounter  Medications   metFORMIN (GLUCOPHAGE) 500 MG tablet    Sig: Take 1 tablet (500 mg total) by mouth 2 (two) times daily with a meal. 1-2 times per day as tolerated.    Dispense:  180  tablet    Refill:  1   Patient Instructions  I will check labs today and let you know if any medication changes needed.  Monitor blood pressure outside the office and if any readings over 130/80, let me know.  No new meds for now. Follow-up in 5 months for a physical.  Sooner if any new concerns or symptoms.  Take care!    Signed,   Meredith Staggers, MD [facility], [facility] 03/20/23 2:25 PM

## 2023-03-23 LAB — HEMOGLOBIN A1C: Hgb A1c MFr Bld: 7.6 % — ABNORMAL HIGH (ref 4.6–6.5)

## 2023-03-27 ENCOUNTER — Telehealth: Payer: Self-pay

## 2023-03-27 NOTE — Telephone Encounter (Signed)
-----   Message from [name] sent at 03/27/2023  4:50 PM EDT ----- It appears he has seen results, but please call with plan: blood sugar was elevated but similar to previous readings, other electrolytes were stable. 33-month blood sugar test was a little bit too high at 7.6.  Would like to see that below 7.  If he is able to make some significant diet, activity changes we can recheck levels in 3 months, otherwise let me know and we can change meds.  Cholesterol levels were overall stable.  Bad cholesterol is slightly higher, but again diet and exercise changes should help.

## 2023-03-27 NOTE — Telephone Encounter (Signed)
Left pt a VM to call office in regards to lab results

## 2023-03-30 NOTE — Telephone Encounter (Signed)
Spoke to pt told him calling about lab results, looks like you reviewed them on My Chart, but have few things to go over. Pt said yes he did see them. Told him per Dr. Neva Seat, blood sugar was elevated but similar to previous readings, other electrolytes were stable. 88-month blood sugar test was a little bit too high at 7.6.  Would like to see that below 7.  If he is able to make some significant diet, activity changes we can recheck levels in 3 months, otherwise let me know and we can change meds.  Pt said he will do diet and exercise for the next 3 months. Cholesterol levels were overall stable.  Bad cholesterol is slightly higher, but again diet and exercise changes should help. Pt verbalized understanding and will call back to schedule appt.

## 2023-05-09 IMAGING — DX DG HAND COMPLETE 3+V*L*
3 series · 3 of 3 positions shown · non-contrast
Comparison: None.

CLINICAL DATA: Posterior proximal hand pain for 2 months

EXAM:
LEFT HAND - COMPLETE 3 VIEW

[hand ap]
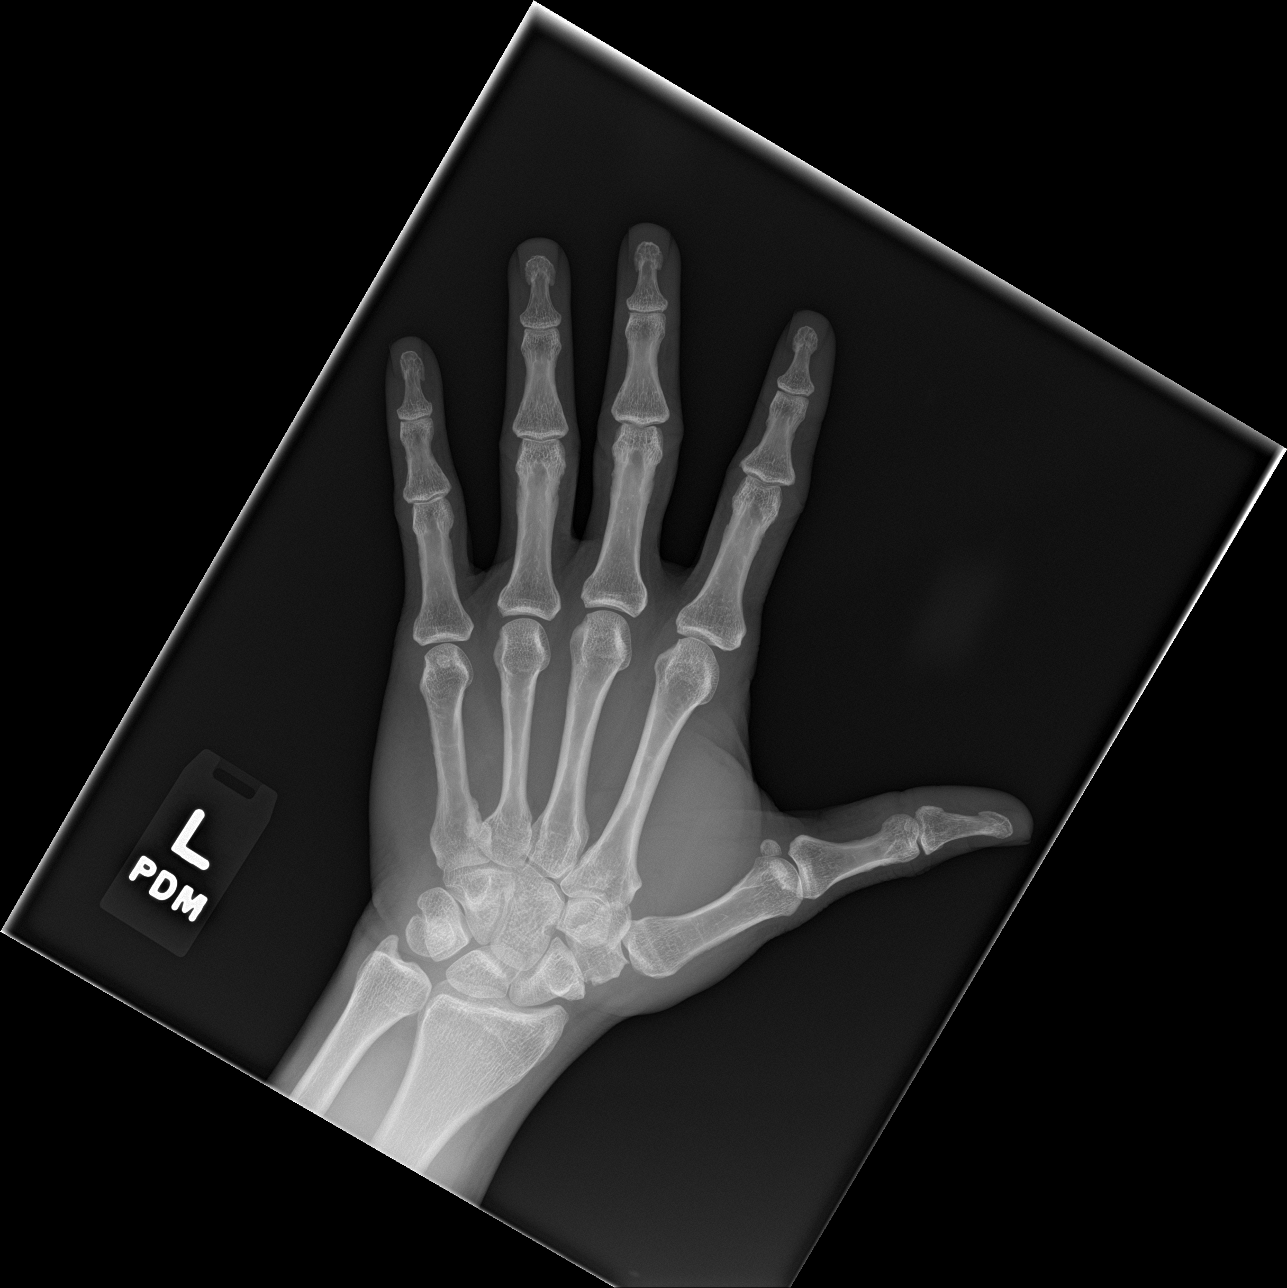

[hand obl]
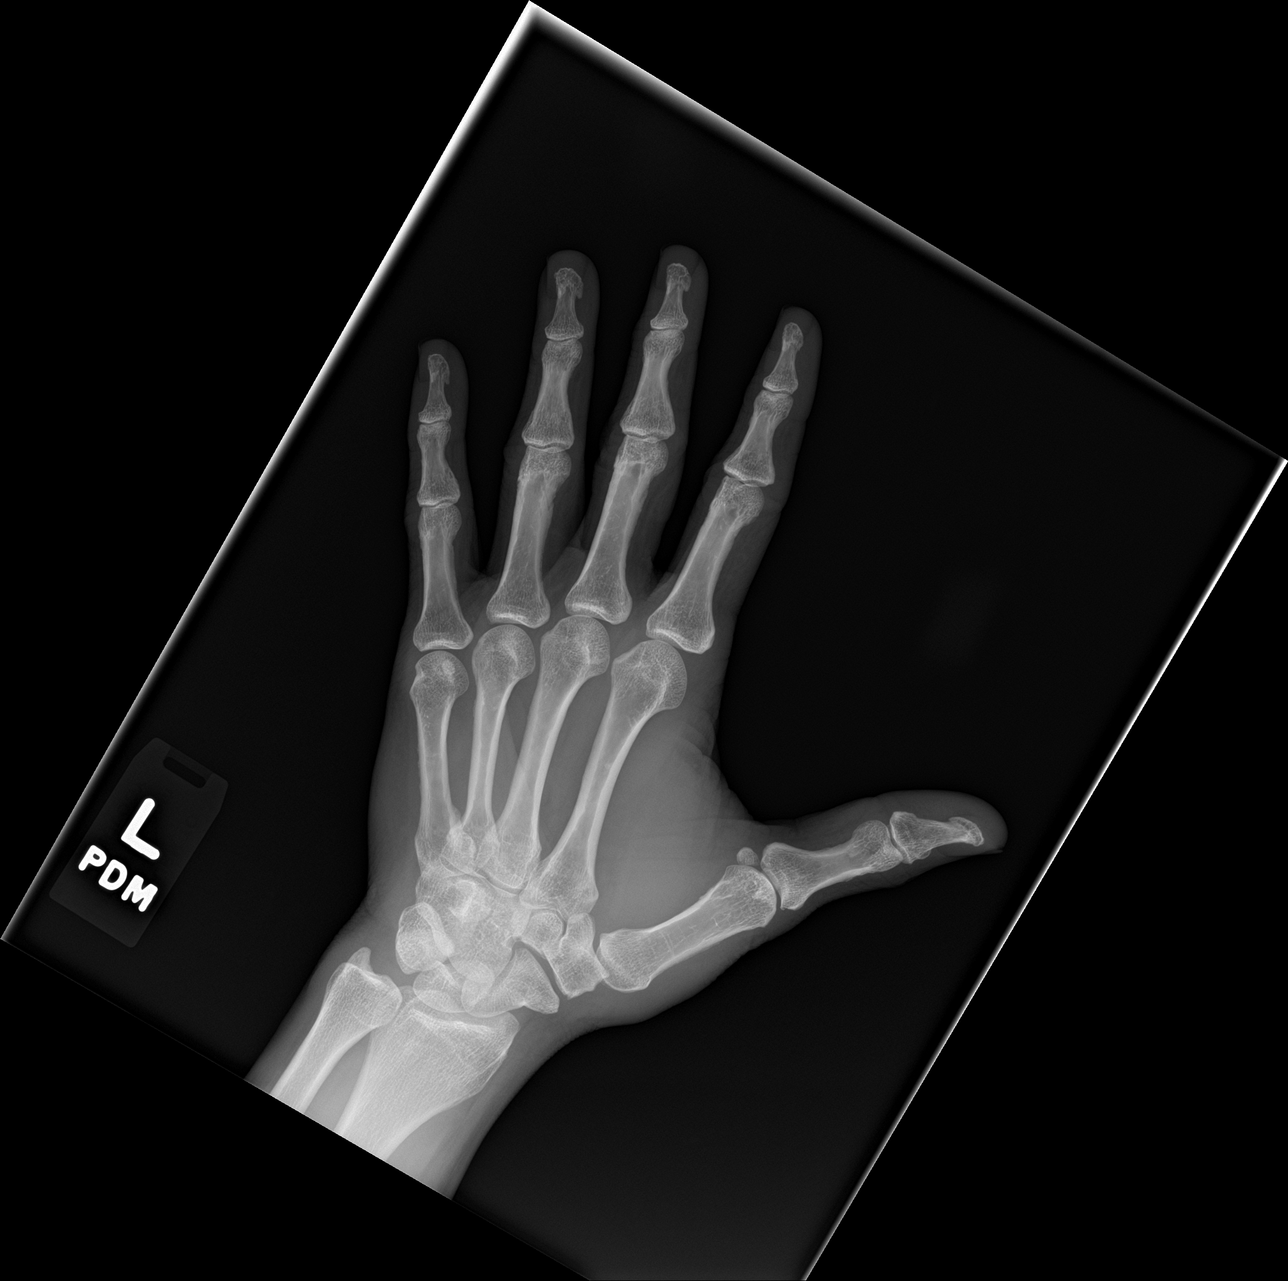

[hand lat]
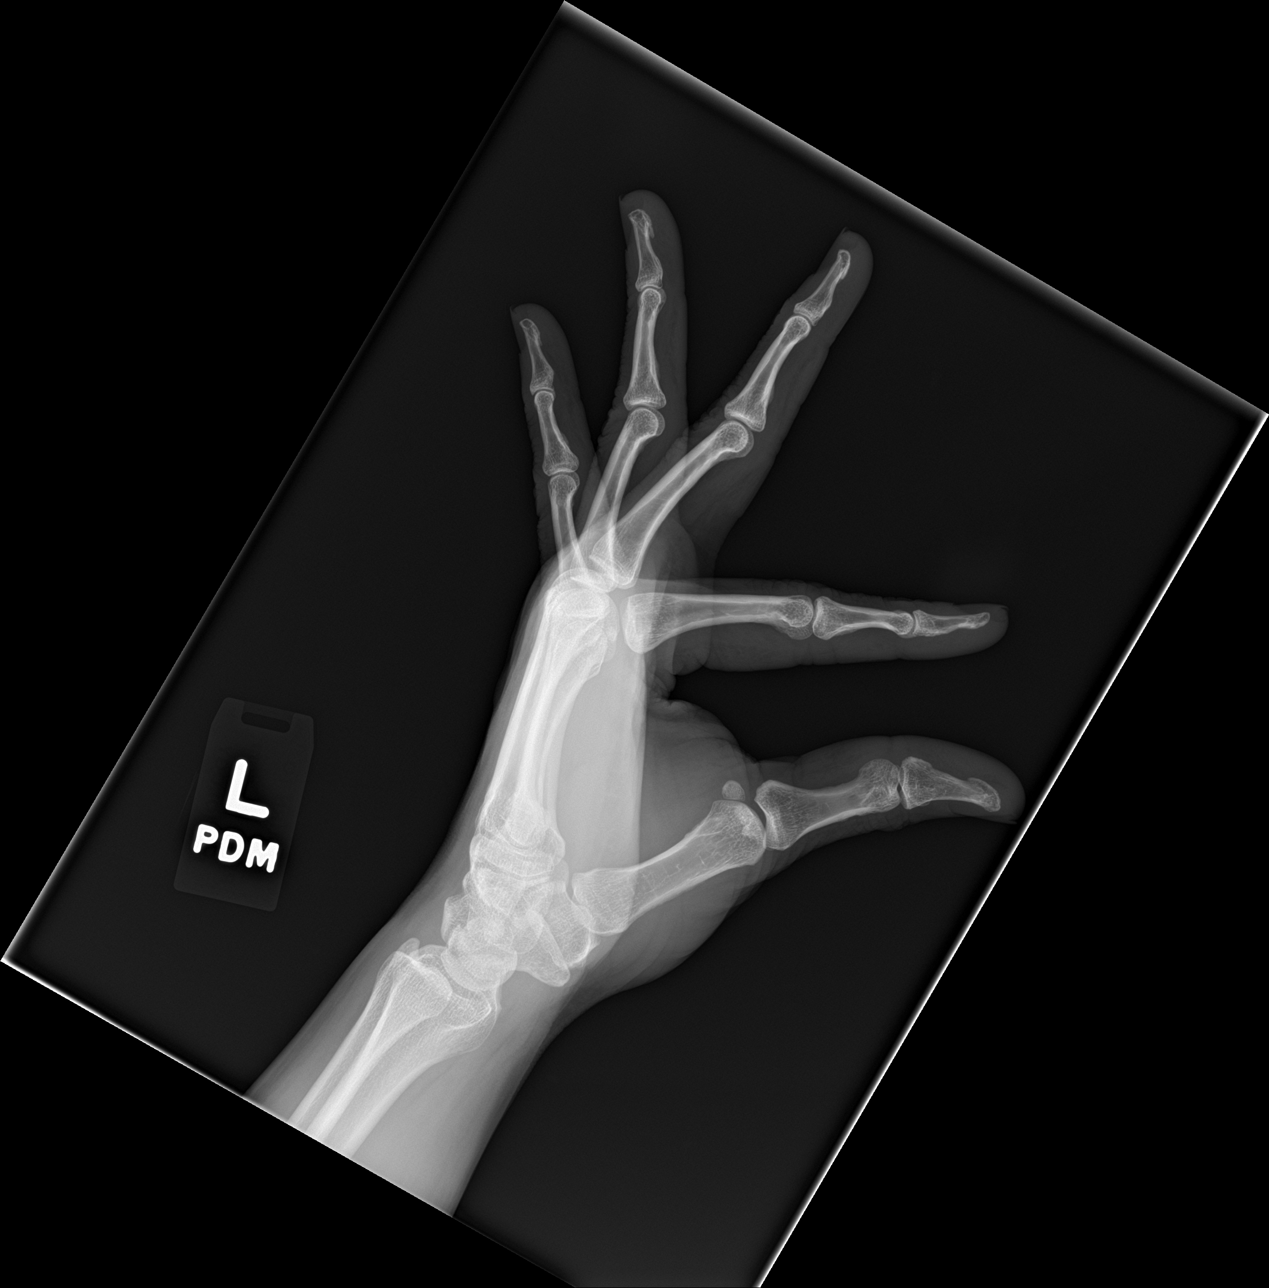

[3 of 3 positions shown; findings below may reference images not displayed]

FINDINGS: Mild interphalangeal articular space narrowing suggest mild
degenerative arthropathy. No significant erosions observed. No soft
tissue calcification identified. Minimal spurring along the lateral
articular margins of the trapezium.
IMPRESSION: 1. Mild degenerative findings. No soft tissue calcification or
erosions identified. If pain persists despite conservative therapy,
MRI may be warranted for further characterization.

## 2023-05-09 IMAGING — DX DG CERVICAL SPINE 2 OR 3 VIEWS
4 series · 4 of 4 positions shown · non-contrast
Comparison: 06/27/2020

CLINICAL DATA: Intermittent left neck pain

EXAM:
CERVICAL SPINE - 2-3 VIEW

[c-spine lat]
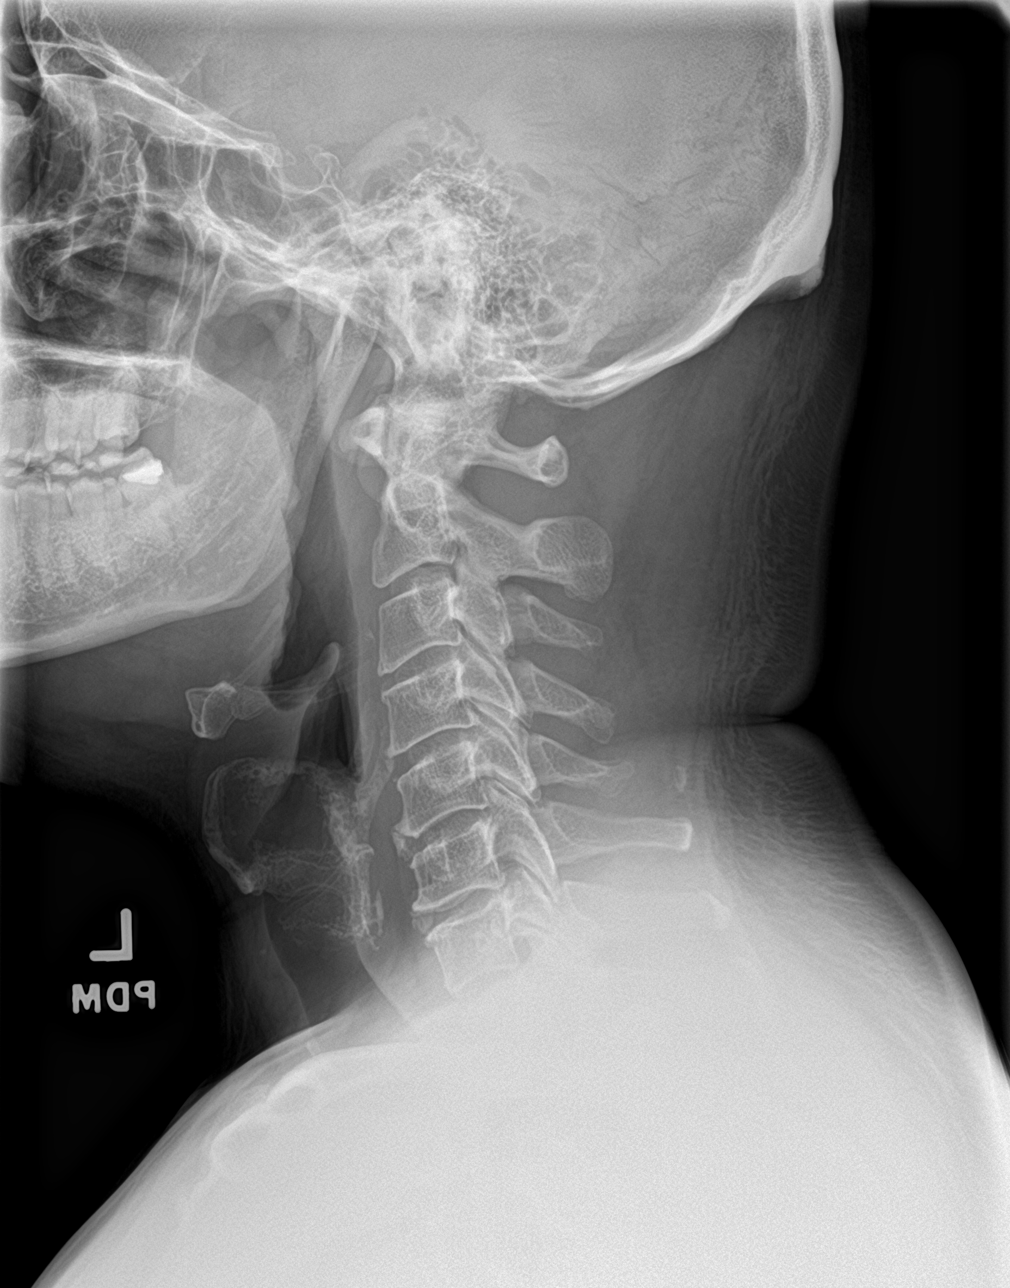

[c-spine ap]
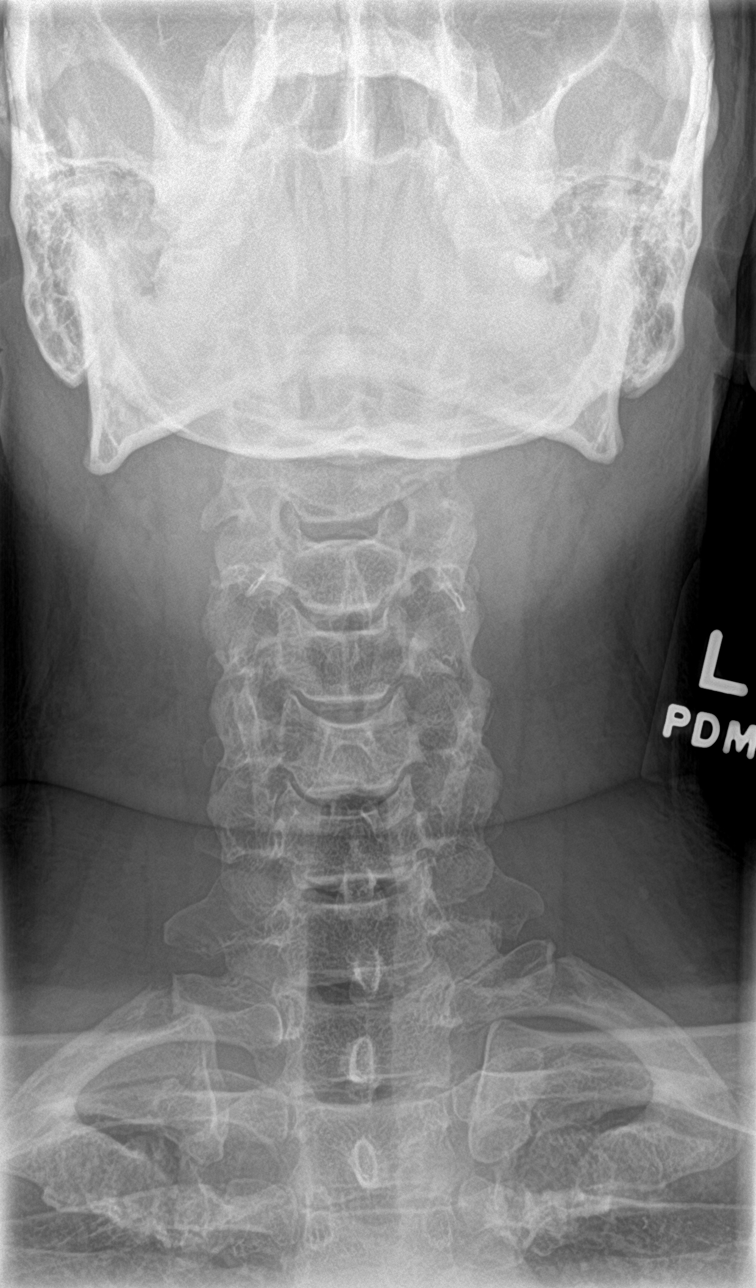

[c-spine open mouth]
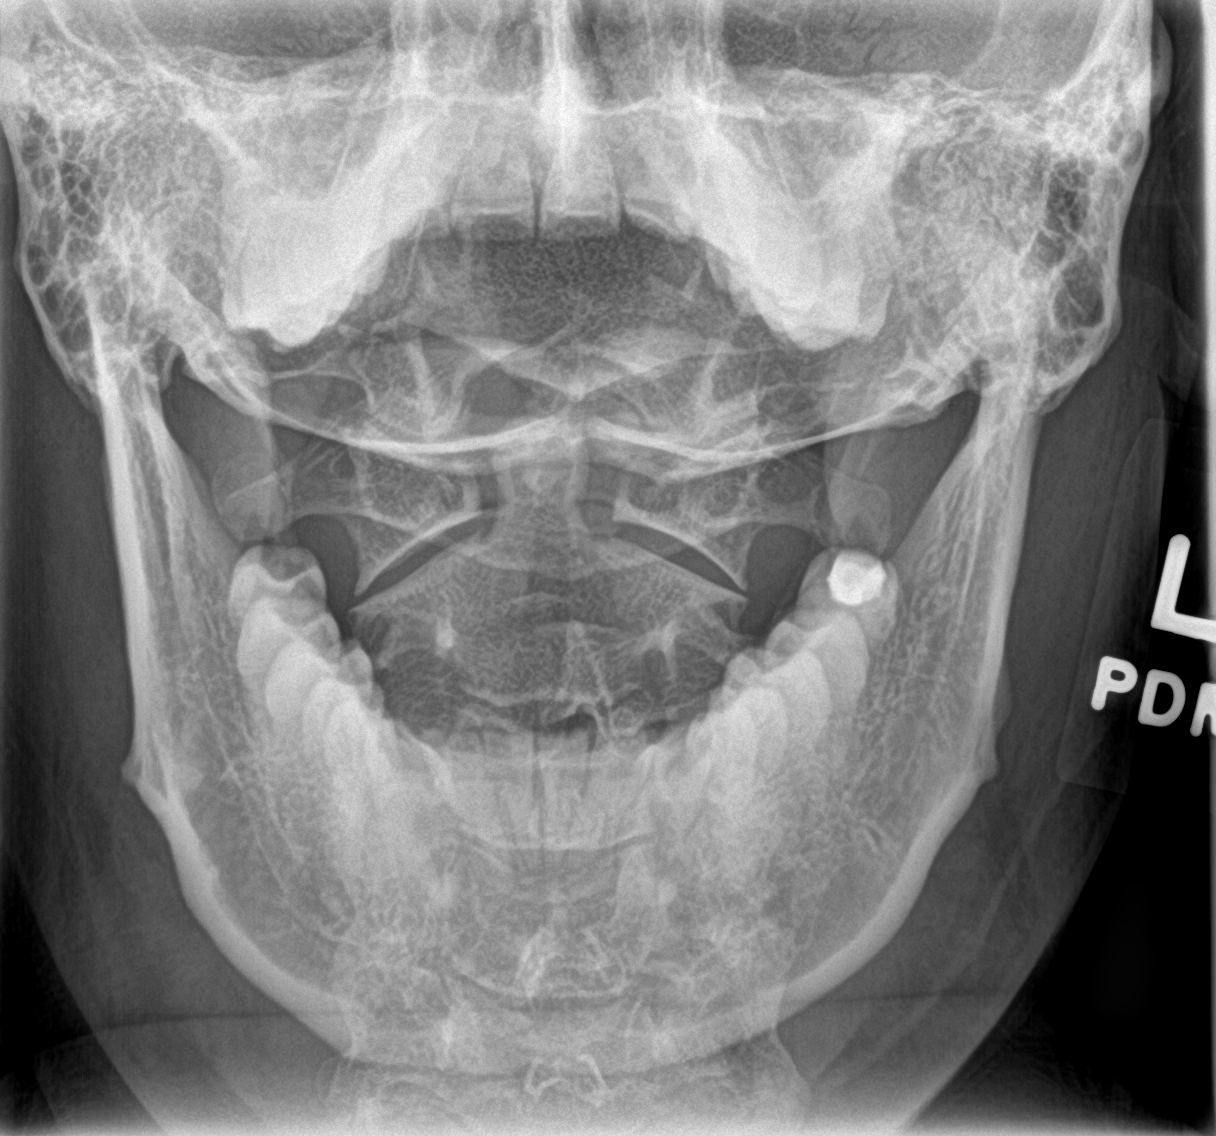

[swimmer]
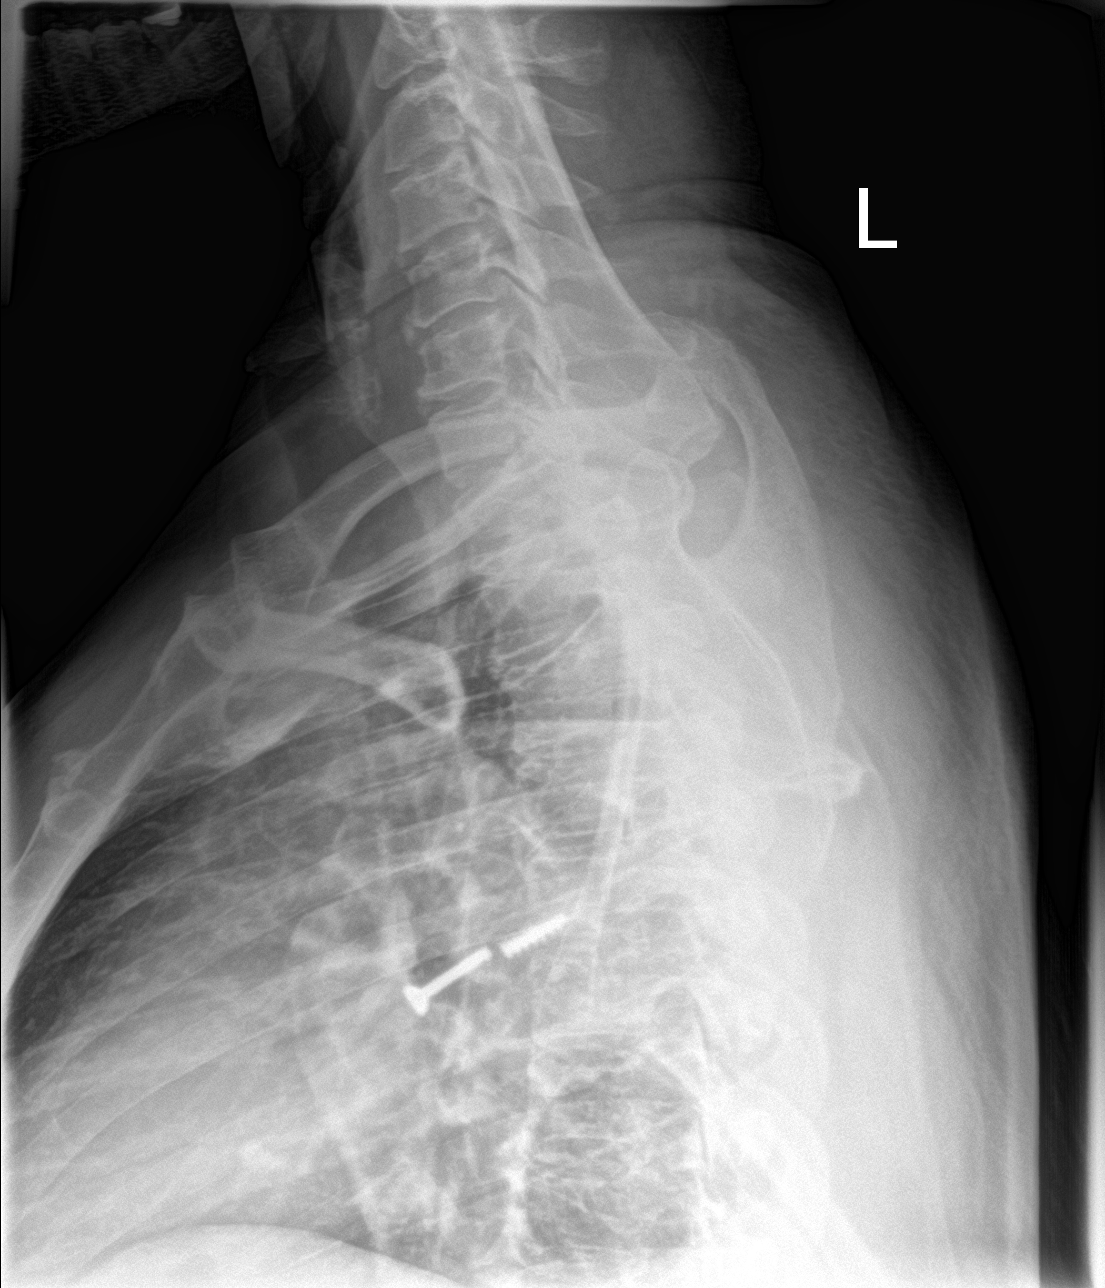

[4 of 4 positions shown; findings below may reference images not displayed]

FINDINGS: Cervical spine is visualized to the level of C7.

Vertebral body heights are maintained. Alignment is anatomic.
Prevertebral soft tissues are normal. No acute fracture.

Mild degenerative disease with disc height loss at C4-5, C5-6 and
C6-7.

Incidental note made of a screw in the anterior inferior glenoid
with a fracture of the midportion of the screw.
IMPRESSION: 1. Mild cervical spine spondylosis.
2.  No acute osseous injury of the cervical spine.

## 2023-05-18 ENCOUNTER — Telehealth: Payer: Self-pay | Admitting: Family Medicine

## 2023-05-18 ENCOUNTER — Other Ambulatory Visit: Payer: Self-pay

## 2023-05-18 DIAGNOSIS — E119 Type 2 diabetes mellitus without complications: Secondary | ICD-10-CM

## 2023-05-18 MED ORDER — METFORMIN HCL 500 MG PO TABS: 500.0000 mg | ORAL_TABLET | Freq: Two times a day (BID) | ORAL | 1 refills | Status: DC

## 2023-05-18 NOTE — Telephone Encounter (Signed)
Called and confirmed he had checked with the pharmacy as it was showing 1 refill should be available but patient reports they did not have this.  Sent refill to requested pharmacy patient thanked me and we disconnected

## 2023-05-18 NOTE — Telephone Encounter (Signed)
Encourage patient to contact the pharmacy for refills or they can request refills through [facility]  (Please schedule appointment if patient has not been seen in over a year)    WHAT PHARMACY WOULD THEY LIKE THIS SENT TO: [id number] - [place], [place] - [address]   MEDICATION NAME & DOSE: metFORMIN (GLUCOPHAGE) 500 MG tablet   NOTES/COMMENTS FROM PATIENT: Pt is currently out of medication     Front office please notify patient: It takes 48-72 hours to process rx refill requests Ask patient to call pharmacy to ensure rx is ready before heading there.

## 2023-06-04 ENCOUNTER — Ambulatory Visit: Payer: Managed Care, Other (non HMO) | Admitting: Family Medicine

## 2023-06-04 VITALS — BP 142/88 | HR 68 | Temp 97.9°F | Ht 70.0 in | Wt 229.6 lb

## 2023-06-04 DIAGNOSIS — R030 Elevated blood-pressure reading, without diagnosis of hypertension: Secondary | ICD-10-CM

## 2023-06-04 DIAGNOSIS — Z7984 Long term (current) use of oral hypoglycemic drugs: Secondary | ICD-10-CM | POA: Diagnosis not present

## 2023-06-04 DIAGNOSIS — Z23 Encounter for immunization: Secondary | ICD-10-CM

## 2023-06-04 DIAGNOSIS — E119 Type 2 diabetes mellitus without complications: Secondary | ICD-10-CM

## 2023-06-04 DIAGNOSIS — Z0000 Encounter for general adult medical examination without abnormal findings: Secondary | ICD-10-CM | POA: Diagnosis not present

## 2023-06-04 NOTE — Patient Instructions (Addendum)
Keep a record of your blood pressures outside of the office and bring them to the next office visit. See info below on high blood pressure below.  Bloodwork next visit. Keep up the good work with diet and exercise.   Managing Your Hypertension Hypertension, also called high blood pressure, is when the force of the blood pressing against the walls of the arteries is too strong. Arteries are blood vessels that carry blood from your heart throughout your body. Hypertension forces the heart to work harder to pump blood and may cause the arteries to become narrow or stiff. Understanding blood pressure readings A blood pressure reading includes a higher number over a lower number: The first, or top, number is called the systolic pressure. It is a measure of the pressure in your arteries as your heart beats. The second, or bottom number, is called the diastolic pressure. It is a measure of the pressure in your arteries as the heart relaxes. For most people, a normal blood pressure is below 120/80. Your personal target blood pressure may vary depending on your medical conditions, your age, and other factors. Blood pressure is classified into four stages. Based on your blood pressure reading, your health care provider may use the following stages to determine what type of treatment you need, if any. Systolic pressure and diastolic pressure are measured in a unit called millimeters of mercury (mmHg). Normal Systolic pressure: below 120. Diastolic pressure: below 80. Elevated Systolic pressure: 120-129. Diastolic pressure: below 80. Hypertension stage 1 Systolic pressure: 130-139. Diastolic pressure: 80-89. Hypertension stage 2 Systolic pressure: 140 or above. Diastolic pressure: 90 or above. How can this condition affect me? Managing your hypertension is very important. Over time, hypertension can damage the arteries and decrease blood flow to parts of the body, including the brain, heart, and  kidneys. Having untreated or uncontrolled hypertension can lead to: A heart attack. A stroke. A weakened blood vessel (aneurysm). Heart failure. Kidney damage. Eye damage. Memory and concentration problems. Vascular dementia. What actions can I take to manage this condition? Hypertension can be managed by making lifestyle changes and possibly by taking medicines. Your health care provider will help you make a plan to bring your blood pressure within a normal range. You may be referred for counseling on a healthy diet and physical activity. Nutrition  Eat a diet that is high in fiber and potassium, and low in salt (sodium), added sugar, and fat. An example eating plan is called the DASH diet. DASH stands for Dietary Approaches to Stop Hypertension. To eat this way: Eat plenty of fresh fruits and vegetables. Try to fill one-half of your plate at each meal with fruits and vegetables. Eat whole grains, such as whole-wheat pasta, brown rice, or whole-grain bread. Fill about one-fourth of your plate with whole grains. Eat low-fat dairy products. Avoid fatty cuts of meat, processed or cured meats, and poultry with skin. Fill about one-fourth of your plate with lean proteins such as fish, chicken without skin, beans, eggs, and tofu. Avoid pre-made and processed foods. These tend to be higher in sodium, added sugar, and fat. Reduce your daily sodium intake. Many people with hypertension should eat less than 1,500 mg of sodium a day. Lifestyle  Work with your health care provider to maintain a healthy body weight or to lose weight. Ask what an ideal weight is for you. Get at least 30 minutes of exercise that causes your heart to beat faster (aerobic exercise) most days of the week. Activities  may include walking, swimming, or biking. Include exercise to strengthen your muscles (resistance exercise), such as weight lifting, as part of your weekly exercise routine. Try to do these types of exercises for  30 minutes at least 3 days a week. Do not use any products that contain nicotine or tobacco. These products include cigarettes, chewing tobacco, and vaping devices, such as e-cigarettes. If you need help quitting, ask your health care provider. Control any long-term (chronic) conditions you have, such as high cholesterol or diabetes. Identify your sources of stress and find ways to manage stress. This may include meditation, deep breathing, or making time for fun activities. Alcohol use Do not drink alcohol if: Your health care provider tells you not to drink. You are pregnant, may be pregnant, or are planning to become pregnant. If you drink alcohol: Limit how much you have to: 0-1 drink a day for women. 0-2 drinks a day for men. Know how much alcohol is in your drink. In the [place] one drink equals one 12 oz bottle of beer (355 mL), one 5 oz glass of wine (148 mL), or one 1 oz glass of hard liquor (44 mL). Medicines Your health care provider may prescribe medicine if lifestyle changes are not enough to get your blood pressure under control and if: Your systolic blood pressure is 130 or higher. Your diastolic blood pressure is 80 or higher. Take medicines only as told by your health care provider. Follow the directions carefully. Blood pressure medicines must be taken as told by your health care provider. The medicine does not work as well when you skip doses. Skipping doses also puts you at risk for problems. Monitoring Before you monitor your blood pressure: Do not smoke, drink caffeinated beverages, or exercise within 30 minutes before taking a measurement. Use the bathroom and empty your bladder (urinate). Sit quietly for at least 5 minutes before taking measurements. Monitor your blood pressure at home as told by your health care provider. To do this: Sit with your back straight and supported. Place your feet flat on the floor. Do not cross your legs. Support your arm on a flat  surface, such as a table. Make sure your upper arm is at heart level. Each time you measure, take two or three readings one minute apart and record the results. You may also need to have your blood pressure checked regularly by your health care provider. General information Talk with your health care provider about your diet, exercise habits, and other lifestyle factors that may be contributing to hypertension. Review all the medicines you take with your health care provider because there may be side effects or interactions. Keep all follow-up visits. Your health care provider can help you create and adjust your plan for managing your high blood pressure. Where to find more information National Heart, Lung, and [employer] Institute: [employer] American Heart Association: www.heart.org Contact a health care provider if: You think you are having a reaction to medicines you have taken. You have repeated (recurrent) headaches. You feel dizzy. You have swelling in your ankles. You have trouble with your vision. Get help right away if: You develop a severe headache or confusion. You have unusual weakness or numbness, or you feel faint. You have severe pain in your chest or abdomen. You vomit repeatedly. You have trouble breathing. These symptoms may be an emergency. Get help right away. Call 911. Do not wait to see if the symptoms will go away. Do not drive yourself to the hospital.  Summary Hypertension is when the force of blood pumping through your arteries is too strong. If this condition is not controlled, it may put you at risk for serious complications. Your personal target blood pressure may vary depending on your medical conditions, your age, and other factors. For most people, a normal blood pressure is less than 120/80. Hypertension is managed by lifestyle changes, medicines, or both. Lifestyle changes to help manage hypertension include losing weight, eating a healthy, low-sodium  diet, exercising more, stopping smoking, and limiting alcohol. This information is not intended to replace advice given to you by your health care provider. Make sure you discuss any questions you have with your health care provider. Document Revised: 04/04/2021 Document Reviewed: 04/04/2021 [employer] Patient Education  2024 [employer].

## 2023-06-04 NOTE — Progress Notes (Signed)
Subjective:  Patient ID: Brian Reid, male    DOB: [birth year]  Age: 46 y.o. MRN: [id number]  CC:  Chief Complaint  Patient presents with   Annual Exam    HPI Brian Reid presents for Annual Exam PCP, me  Diabetes: Metformin 500 mg twice daily.  Slight elevation on August 16 labs. No side effects.  Statins have been discussed, declined. Variable control diet and levels of exercise.  Some increased travel for work the last discussed in August. Less travel recently.  Has improved exercise and some improvement in diet.  Home readings - fasting 125 week ago.   No sx lows.  Microalbumin: nl on 08/15/22 Optho, foot exam, pneumovax: utd.   Lab Results  Component Value Date   HGBA1C 7.6 (H) 03/20/2023   HGBA1C 7.4 (H) 08/15/2022   HGBA1C 7.3 (A) 02/21/2022   Lab Results  Component Value Date   MICROALBUR <0.7 08/15/2022   LDLCALC 113 (H) 07/19/2021   CREATININE 0.91 03/20/2023   Blood pressure elevation Elevated reading at his August visit.  Had not slept well the night prior.  No antihypertensives, asymptomatic at that time.  Continue monitoring without new meds planned. Home readings - none.  Less sleep last night again.  BP Readings from Last 3 Encounters:  06/04/23 (!) 142/88  03/20/23 (!) 144/88  08/14/22 130/78        06/04/2023    1:05 PM 03/20/2023   10:52 AM 08/14/2022   10:28 AM 07/31/2022    2:00 PM 02/21/2022   10:20 AM  Depression screen PHQ 2/9  Decreased Interest 0 0 0 0 0  Down, Depressed, Hopeless 0 0 0 0 0  PHQ - 2 Score 0 0 0 0 0  Altered sleeping 0 0 0 0   Tired, decreased energy 0 0 0 0   Change in appetite 0 0 0 0   Feeling bad or failure about yourself  0 0 0 0   Trouble concentrating 0 0 0 0   Moving slowly or fidgety/restless 0 0 0 0   Suicidal thoughts 0 0 0 0   PHQ-9 Score 0 0 0 0   Difficult doing work/chores Not difficult at all        Health Maintenance  Topic Date Due   Colonoscopy  08/01/2023 (Originally 12/04/2020)    Diabetic kidney evaluation - Urine ACR  08/16/2023   Diabetic kidney evaluation - eGFR measurement  03/19/2024   HIV Screening  Completed   HPV VACCINES  Aged Out   DTaP/Tdap/Td  Discontinued   INFLUENZA VACCINE  Discontinued   COVID-19 Vaccine  Discontinued   Hepatitis C Screening  Discontinued  No FH of colon CA. Cologuard negative 03/22/21. Repeat next year.  Prostate: does not have family history of prostate cancer The natural history of prostate cancer and ongoing controversy regarding screening and potential treatment outcomes of prostate cancer has been discussed with the patient. The meaning of a false positive PSA and a false negative PSA has been discussed. He indicates understanding of the limitations of this screening test and wishes NOT to proceed with screening PSA testing. No results found for: "PSA1", "PSA"    Immunization History  Administered Date(s) Administered   PFIZER Comirnaty(Gray Top)Covid-19 Tri-Sucrose Vaccine 11/15/2019, 12/06/2019, 07/31/2020   PFIZER(Purple Top)SARS-COV-2 Vaccination 07/31/2020  Flu vaccine: declines Covid booster - declines.  Tdap - today.   No results found. Optho in August - no concerns.   Dental: due - appt next week.  Alcohol: none  Tobacco: none  Exercise: walking 40 min 3-4 days per week, cardio and some body weights - 15-30min past month.    History Patient Active Problem List   Diagnosis Date Noted   Pain in left hand 09/09/2021   Left forearm pain 04/12/2021   Acute appendicitis s/p lap appendectomy 04/02/2016 04/03/2016   Past Medical History:  Diagnosis Date   Asthma    Past Surgical History:  Procedure Laterality Date   APPENDECTOMY N/A    [employer] 06/27/2020   LAPAROSCOPIC APPENDECTOMY N/A 04/02/2016   Procedure: APPENDECTOMY LAPAROSCOPIC;  Surgeon: Chevis Pretty III, MD;  Location: [facility];  Service: General;  Laterality: N/A;   LEG SURGERY     age 43   OPEN REDUCTION SHOULDER DISLOCATION     age 24   No  Known Allergies Prior to Admission medications   Medication Sig Start Date End Date Taking? Authorizing Provider  metFORMIN (GLUCOPHAGE) 500 MG tablet Take 1 tablet (500 mg total) by mouth 2 (two) times daily with a meal. 1-2 times per day as tolerated. 05/18/23  Yes [name], MD   Social History   Socioeconomic History   Marital status: Married    Spouse name: Pattricia Boss   Number of children: 2   Years of education: post grad   Highest education level: Master's degree (e.g., MA, MS, MEng, MEd, MSW, MBA)  Occupational History    Comment: IT professional  Tobacco Use   Smoking status: Former   Smokeless tobacco: Never   Tobacco comments:    03/05/16 "very occasional now", 06/06/16 not smoking  Vaping Use   Vaping status: Never Used  Substance and Sexual Activity   Alcohol use: Yes    Alcohol/week: 2.0 standard drinks of alcohol    Types: 2 Standard drinks or equivalent per week    Comment: social   Drug use: No   Sexual activity: Yes  Other Topics Concern   Not on file  Social History Narrative   Lives with family   Drinks 1 cup of coffee a day    Social Determinants of Health   Financial Resource Strain: Low Risk  (06/04/2023)   Overall Financial Resource Strain (CARDIA)    Difficulty of Paying Living Expenses: Not hard at all  Food Insecurity: No Food Insecurity (06/04/2023)   Hunger Vital Sign    Worried About Running Out of Food in the Last Year: Never true    Ran Out of Food in the Last Year: Never true  Transportation Needs: No Transportation Needs (06/04/2023)   PRAPARE - Administrator, Civil Service (Medical): No    Lack of Transportation (Non-Medical): No  Physical Activity: Sufficiently Active (06/04/2023)   Exercise Vital Sign    Days of Exercise per Week: 4 days    Minutes of Exercise per Session: 40 min  Stress: No Stress Concern Present (06/04/2023)   [employer] of Occupational Health - Occupational Stress Questionnaire    Feeling  of Stress : Not at all  Social Connections: Socially Integrated (06/04/2023)   Social Connection and Isolation Panel [NHANES]    Frequency of Communication with Friends and Family: More than three times a week    Frequency of Social Gatherings with Friends and Family: Twice a week    Attends Religious Services: More than 4 times per year    Active Member of [employer] or Organizations: Yes    Attends Banker Meetings: More than 4 times per year  Marital Status: Married  Catering manager Violence: Not on file    Review of Systems  13 point review of systems per patient health survey noted.  Negative other than as indicated above or in HPI.   Objective:   Vitals:   06/04/23 1303  BP: (!) 142/88  Pulse: 68  Temp: 97.9 F (36.6 C)  TempSrc: Temporal  SpO2: 99%  Weight: 229 lb 9.6 oz (104.1 kg)  Height: 5\' 10"  (1.778 m)     Physical Exam Vitals reviewed.  Constitutional:      Appearance: He is well-developed.  HENT:     Head: Normocephalic and atraumatic.     Right Ear: External ear normal.     Left Ear: External ear normal.  Eyes:     Conjunctiva/sclera: Conjunctivae normal.     Pupils: Pupils are equal, round, and reactive to light.  Neck:     Thyroid: No thyromegaly.  Cardiovascular:     Rate and Rhythm: Normal rate and regular rhythm.     Heart sounds: Normal heart sounds.  Pulmonary:     Effort: Pulmonary effort is normal. No respiratory distress.     Breath sounds: Normal breath sounds. No wheezing.  Abdominal:     General: There is no distension.     Palpations: Abdomen is soft.     Tenderness: There is no abdominal tenderness.  Musculoskeletal:        General: No tenderness. Normal range of motion.     Cervical back: Normal range of motion and neck supple.  Lymphadenopathy:     Cervical: No cervical adenopathy.  Skin:    General: Skin is warm and dry.  Neurological:     Mental Status: He is alert and oriented to person, place, and time.      Deep Tendon Reflexes: Reflexes are normal and symmetric.  Psychiatric:        Behavior: Behavior normal.     Assessment & Plan:  Brian Reid is a 47 y.o. male . Annual physical exam  Diabetes mellitus without complication (HCC)  Elevated blood pressure reading  Need for Tdap vaccination - Plan: Tdap vaccine greater than or equal to 7yo IM  -anticipatory guidance as below in AVS, screening labs -plan at 6-week follow-up as A1c will be due at that time.  Commended on diet/exercise changes, anticipate improvement in A1c as well as blood pressure next visit.  Hold on new meds for now.  Tdap updated.Marland Kitchen Health maintenance items as above in HPI discussed/recommended as applicable. \  No orders of the defined types were placed in this encounter.  Patient Instructions  Keep a record of your blood pressures outside of the office and bring them to the next office visit. See info below on high blood pressure below.  Bloodwork next visit. Keep up the good work with diet and exercise.   Managing Your Hypertension Hypertension, also called high blood pressure, is when the force of the blood pressing against the walls of the arteries is too strong. Arteries are blood vessels that carry blood from your heart throughout your body. Hypertension forces the heart to work harder to pump blood and may cause the arteries to become narrow or stiff. Understanding blood pressure readings A blood pressure reading includes a higher number over a lower number: The first, or top, number is called the systolic pressure. It is a measure of the pressure in your arteries as your heart beats. The second, or bottom number, is called the diastolic pressure. It  is a measure of the pressure in your arteries as the heart relaxes. For most people, a normal blood pressure is below 120/80. Your personal target blood pressure may vary depending on your medical conditions, your age, and other factors. Blood pressure is classified  into four stages. Based on your blood pressure reading, your health care provider may use the following stages to determine what type of treatment you need, if any. Systolic pressure and diastolic pressure are measured in a unit called millimeters of mercury (mmHg). Normal Systolic pressure: below 120. Diastolic pressure: below 80. Elevated Systolic pressure: 120-129. Diastolic pressure: below 80. Hypertension stage 1 Systolic pressure: 130-139. Diastolic pressure: 80-89. Hypertension stage 2 Systolic pressure: 140 or above. Diastolic pressure: 90 or above. How can this condition affect me? Managing your hypertension is very important. Over time, hypertension can damage the arteries and decrease blood flow to parts of the body, including the brain, heart, and kidneys. Having untreated or uncontrolled hypertension can lead to: A heart attack. A stroke. A weakened blood vessel (aneurysm). Heart failure. Kidney damage. Eye damage. Memory and concentration problems. Vascular dementia. What actions can I take to manage this condition? Hypertension can be managed by making lifestyle changes and possibly by taking medicines. Your health care provider will help you make a plan to bring your blood pressure within a normal range. You may be referred for counseling on a healthy diet and physical activity. Nutrition  Eat a diet that is high in fiber and potassium, and low in salt (sodium), added sugar, and fat. An example eating plan is called the DASH diet. DASH stands for Dietary Approaches to Stop Hypertension. To eat this way: Eat plenty of fresh fruits and vegetables. Try to fill one-half of your plate at each meal with fruits and vegetables. Eat whole grains, such as whole-wheat pasta, brown rice, or whole-grain bread. Fill about one-fourth of your plate with whole grains. Eat low-fat dairy products. Avoid fatty cuts of meat, processed or cured meats, and poultry with skin. Fill about  one-fourth of your plate with lean proteins such as fish, chicken without skin, beans, eggs, and tofu. Avoid pre-made and processed foods. These tend to be higher in sodium, added sugar, and fat. Reduce your daily sodium intake. Many people with hypertension should eat less than 1,500 mg of sodium a day. Lifestyle  Work with your health care provider to maintain a healthy body weight or to lose weight. Ask what an ideal weight is for you. Get at least 30 minutes of exercise that causes your heart to beat faster (aerobic exercise) most days of the week. Activities may include walking, swimming, or biking. Include exercise to strengthen your muscles (resistance exercise), such as weight lifting, as part of your weekly exercise routine. Try to do these types of exercises for 30 minutes at least 3 days a week. Do not use any products that contain nicotine or tobacco. These products include cigarettes, chewing tobacco, and vaping devices, such as e-cigarettes. If you need help quitting, ask your health care provider. Control any long-term (chronic) conditions you have, such as high cholesterol or diabetes. Identify your sources of stress and find ways to manage stress. This may include meditation, deep breathing, or making time for fun activities. Alcohol use Do not drink alcohol if: Your health care provider tells you not to drink. You are pregnant, may be pregnant, or are planning to become pregnant. If you drink alcohol: Limit how much you have to: 0-1 drink a day  for women. 0-2 drinks a day for men. Know how much alcohol is in your drink. In the [place] one drink equals one 12 oz bottle of beer (355 mL), one 5 oz glass of wine (148 mL), or one 1 oz glass of hard liquor (44 mL). Medicines Your health care provider may prescribe medicine if lifestyle changes are not enough to get your blood pressure under control and if: Your systolic blood pressure is 130 or higher. Your diastolic blood pressure  is 80 or higher. Take medicines only as told by your health care provider. Follow the directions carefully. Blood pressure medicines must be taken as told by your health care provider. The medicine does not work as well when you skip doses. Skipping doses also puts you at risk for problems. Monitoring Before you monitor your blood pressure: Do not smoke, drink caffeinated beverages, or exercise within 30 minutes before taking a measurement. Use the bathroom and empty your bladder (urinate). Sit quietly for at least 5 minutes before taking measurements. Monitor your blood pressure at home as told by your health care provider. To do this: Sit with your back straight and supported. Place your feet flat on the floor. Do not cross your legs. Support your arm on a flat surface, such as a table. Make sure your upper arm is at heart level. Each time you measure, take two or three readings one minute apart and record the results. You may also need to have your blood pressure checked regularly by your health care provider. General information Talk with your health care provider about your diet, exercise habits, and other lifestyle factors that may be contributing to hypertension. Review all the medicines you take with your health care provider because there may be side effects or interactions. Keep all follow-up visits. Your health care provider can help you create and adjust your plan for managing your high blood pressure. Where to find more information National Heart, Lung, and Blood Institute: Pop[employer].is American Heart Association: www.heart.org Contact a health care provider if: You think you are having a reaction to medicines you have taken. You have repeated (recurrent) headaches. You feel dizzy. You have swelling in your ankles. You have trouble with your vision. Get help right away if: You develop a severe headache or confusion. You have unusual weakness or numbness, or you feel  faint. You have severe pain in your chest or abdomen. You vomit repeatedly. You have trouble breathing. These symptoms may be an emergency. Get help right away. Call 911. Do not wait to see if the symptoms will go away. Do not drive yourself to the hospital. Summary Hypertension is when the force of blood pumping through your arteries is too strong. If this condition is not controlled, it may put you at risk for serious complications. Your personal target blood pressure may vary depending on your medical conditions, your age, and other factors. For most people, a normal blood pressure is less than 120/80. Hypertension is managed by lifestyle changes, medicines, or both. Lifestyle changes to help manage hypertension include losing weight, eating a healthy, low-sodium diet, exercising more, stopping smoking, and limiting alcohol. This information is not intended to replace advice given to you by your health care provider. Make sure you discuss any questions you have with your health care provider. Document Revised: 04/04/2021 Document Reviewed: 04/04/2021 Elsevier Patient Education  2024 Elsevier Inc.      Signed,   Meredith Staggers, MD [facility], [facility] 06/04/23  1:36 PM

## 2023-07-06 ENCOUNTER — Emergency Department (HOSPITAL_COMMUNITY): Payer: Managed Care, Other (non HMO)

## 2023-07-06 ENCOUNTER — Encounter (HOSPITAL_COMMUNITY): Payer: Self-pay

## 2023-07-06 ENCOUNTER — Observation Stay (HOSPITAL_COMMUNITY)
Admission: EM | Admit: 2023-07-06 | Discharge: 2023-07-08 | Disposition: A | Discharge status: Home / Self Care | Payer: Managed Care, Other (non HMO) | Attending: Internal Medicine | Admitting: Internal Medicine

## 2023-07-06 ENCOUNTER — Other Ambulatory Visit: Payer: Self-pay

## 2023-07-06 DIAGNOSIS — E669 Obesity, unspecified: Secondary | ICD-10-CM | POA: Diagnosis not present

## 2023-07-06 DIAGNOSIS — Z87891 Personal history of nicotine dependence: Secondary | ICD-10-CM | POA: Diagnosis not present

## 2023-07-06 DIAGNOSIS — E1165 Type 2 diabetes mellitus with hyperglycemia: Secondary | ICD-10-CM | POA: Diagnosis not present

## 2023-07-06 DIAGNOSIS — R030 Elevated blood-pressure reading, without diagnosis of hypertension: Secondary | ICD-10-CM | POA: Diagnosis not present

## 2023-07-06 DIAGNOSIS — I10 Essential (primary) hypertension: Secondary | ICD-10-CM | POA: Insufficient documentation

## 2023-07-06 DIAGNOSIS — G459 Transient cerebral ischemic attack, unspecified: Secondary | ICD-10-CM | POA: Diagnosis not present

## 2023-07-06 DIAGNOSIS — Z6833 Body mass index (BMI) 33.0-33.9, adult: Secondary | ICD-10-CM | POA: Diagnosis not present

## 2023-07-06 DIAGNOSIS — J45909 Unspecified asthma, uncomplicated: Secondary | ICD-10-CM | POA: Insufficient documentation

## 2023-07-06 DIAGNOSIS — Z7984 Long term (current) use of oral hypoglycemic drugs: Secondary | ICD-10-CM | POA: Diagnosis not present

## 2023-07-06 DIAGNOSIS — I6381 Other cerebral infarction due to occlusion or stenosis of small artery: Secondary | ICD-10-CM

## 2023-07-06 DIAGNOSIS — R200 Anesthesia of skin: Secondary | ICD-10-CM | POA: Diagnosis present

## 2023-07-06 DIAGNOSIS — I639 Cerebral infarction, unspecified: Principal | ICD-10-CM

## 2023-07-06 LAB — CBC
HCT: 44 % (ref 39.0–52.0)
Hemoglobin: 14.7 g/dL (ref 13.0–17.0)
MCH: 26.3 pg (ref 26.0–34.0)
MCHC: 33.4 g/dL (ref 30.0–36.0)
MCV: 78.6 fL — ABNORMAL LOW (ref 80.0–100.0)
Platelets: 214 10*3/uL (ref 150–400)
RBC: 5.6 MIL/uL (ref 4.22–5.81)
RDW: 13 % (ref 11.5–15.5)
WBC: 6.3 10*3/uL (ref 4.0–10.5)
nRBC: 0 % (ref 0.0–0.2)

## 2023-07-06 LAB — CBG MONITORING, ED: Glucose-Capillary: 146 mg/dL — ABNORMAL HIGH (ref 70–99)

## 2023-07-06 LAB — COMPREHENSIVE METABOLIC PANEL
ALT: 44 U/L (ref 0–44)
AST: 27 U/L (ref 15–41)
Albumin: 3.9 g/dL (ref 3.5–5.0)
Alkaline Phosphatase: 46 U/L (ref 38–126)
Anion gap: 9 (ref 5–15)
BUN: 11 mg/dL (ref 6–20)
CO2: 26 mmol/L (ref 22–32)
Calcium: 9.1 mg/dL (ref 8.9–10.3)
Chloride: 100 mmol/L (ref 98–111)
Creatinine, Ser: 0.95 mg/dL (ref 0.61–1.24)
GFR, Estimated: >60 mL/min (ref >60)
Glucose, Bld: 197 mg/dL — ABNORMAL HIGH (ref 70–99)
Potassium: 3.8 mmol/L (ref 3.5–5.1)
Sodium: 135 mmol/L (ref 135–145)
Total Bilirubin: 0.9 mg/dL (ref <1.2)
Total Protein: 6.7 g/dL (ref 6.5–8.1)

## 2023-07-06 LAB — DIFFERENTIAL
Abs Immature Granulocytes: 0.02 10*3/uL (ref 0.00–0.07)
Basophils Absolute: 0 10*3/uL (ref 0.0–0.1)
Basophils Relative: 1 %
Eosinophils Absolute: 0.1 10*3/uL (ref 0.0–0.5)
Eosinophils Relative: 2 %
Immature Granulocytes: 0 %
Lymphocytes Relative: 36 %
Lymphs Abs: 2.3 10*3/uL (ref 0.7–4.0)
Monocytes Absolute: 0.4 10*3/uL (ref 0.1–1.0)
Monocytes Relative: 7 %
Neutro Abs: 3.5 10*3/uL (ref 1.7–7.7)
Neutrophils Relative %: 54 %

## 2023-07-06 LAB — APTT: aPTT: 26 s (ref 24–36)

## 2023-07-06 LAB — URINALYSIS, ROUTINE W REFLEX MICROSCOPIC
Bilirubin Urine: NEGATIVE
Glucose, UA: NEGATIVE mg/dL
Hgb urine dipstick: NEGATIVE
Ketones, ur: NEGATIVE mg/dL
Leukocytes,Ua: NEGATIVE
Nitrite: NEGATIVE
Protein, ur: NEGATIVE mg/dL
Specific Gravity, Urine: 1.01 (ref 1.005–1.030)
pH: 6 (ref 5.0–8.0)

## 2023-07-06 LAB — I-STAT CHEM 8, ED
BUN: 11 mg/dL (ref 6–20)
Calcium, Ion: 1.14 mmol/L — ABNORMAL LOW (ref 1.15–1.40)
Chloride: 98 mmol/L (ref 98–111)
Creatinine, Ser: 0.9 mg/dL (ref 0.61–1.24)
Glucose, Bld: 197 mg/dL — ABNORMAL HIGH (ref 70–99)
HCT: 44 % (ref 39.0–52.0)
Hemoglobin: 15 g/dL (ref 13.0–17.0)
Potassium: 3.8 mmol/L (ref 3.5–5.1)
Sodium: 138 mmol/L (ref 135–145)
TCO2: 26 mmol/L (ref 22–32)

## 2023-07-06 LAB — ETHANOL: Alcohol, Ethyl (B): <10 mg/dL (ref <10)

## 2023-07-06 LAB — PROTIME-INR
INR: 1 (ref 0.8–1.2)
Prothrombin Time: 13.7 s (ref 11.4–15.2)

## 2023-07-06 MED ORDER — ASPIRIN 81 MG PO TBEC
81.0000 mg | DELAYED_RELEASE_TABLET | Freq: Every day | ORAL | Status: DC
  Administered 2023-07-07 – 2023-07-08 (×2): 81 mg via ORAL
  Filled 2023-07-06 (×2): qty 1

## 2023-07-06 MED ORDER — HYDROMORPHONE HCL 1 MG/ML IJ SOLN
0.5000 mg | Freq: Once | INTRAMUSCULAR | Status: AC
  Administered 2023-07-06: 0.5 mg via INTRAVENOUS
  Filled 2023-07-06: qty 1

## 2023-07-06 MED ORDER — ACETAMINOPHEN 160 MG/5ML PO SOLN: 650.0000 mg | ORAL | Status: DC | PRN

## 2023-07-06 MED ORDER — SENNOSIDES-DOCUSATE SODIUM 8.6-50 MG PO TABS
1.0000 | ORAL_TABLET | Freq: Every evening | ORAL | Status: DC | PRN
Start: 2023-07-06 — End: 2023-07-08

## 2023-07-06 MED ORDER — IOHEXOL 350 MG/ML SOLN
75.0000 mL | Freq: Once | INTRAVENOUS | Status: AC | PRN
  Administered 2023-07-06: 75 mL via INTRAVENOUS

## 2023-07-06 MED ORDER — STROKE: EARLY STAGES OF RECOVERY BOOK
Freq: Once | Status: AC
  Filled 2023-07-06: qty 1

## 2023-07-06 MED ORDER — CLOPIDOGREL BISULFATE 75 MG PO TABS
75.0000 mg | ORAL_TABLET | Freq: Every day | ORAL | Status: DC
  Administered 2023-07-07 – 2023-07-08 (×2): 75 mg via ORAL
  Filled 2023-07-06 (×2): qty 1

## 2023-07-06 MED ORDER — INSULIN ASPART 100 UNIT/ML IJ SOLN
0.0000 [IU] | Freq: Three times a day (TID) | INTRAMUSCULAR | Status: DC
  Administered 2023-07-07 (×2): 2 [IU] via SUBCUTANEOUS
  Administered 2023-07-08: 3 [IU] via SUBCUTANEOUS

## 2023-07-06 MED ORDER — ENOXAPARIN SODIUM 40 MG/0.4ML IJ SOSY
40.0000 mg | PREFILLED_SYRINGE | Freq: Every day | INTRAMUSCULAR | Status: DC
Start: 2023-07-06 — End: 2023-07-08
  Administered 2023-07-07 (×2): 40 mg via SUBCUTANEOUS
  Filled 2023-07-06 (×2): qty 0.4

## 2023-07-06 MED ORDER — ACETAMINOPHEN 325 MG PO TABS: 650.0000 mg | ORAL_TABLET | ORAL | Status: DC | PRN

## 2023-07-06 MED ORDER — INSULIN ASPART 100 UNIT/ML IJ SOLN
0.0000 [IU] | Freq: Every day | INTRAMUSCULAR | Status: DC
  Administered 2023-07-07: 2 [IU] via SUBCUTANEOUS

## 2023-07-06 MED ORDER — ACETAMINOPHEN 650 MG RE SUPP: 650.0000 mg | RECTAL | Status: DC | PRN

## 2023-07-06 NOTE — ED Provider Notes (Signed)
[place] [facility]PARTMENT AT [facility] Provider Note   CSN: [id number] Arrival date & time: 07/06/23  1502     History  Chief Complaint  Patient presents with   Numbness    Brian Reid is a 47 y.o. male.  HPI Patient reports he was sitting on the couch eating lunch around 2 PM.  He reports he suddenly felt like his right hand was tremoring.  He reports it seemed to be moving somewhat side-to-side and he could not really control the movement.  He reports that the movement abated he had a feeling of some tingling sensation and numbness that was mostly from the elbow down.  He reports this started to ease off but then he got some waxing and waning sensation of tingling in the hand and forearm.  Patient reports has had problems with some neck pain that has been present without a specific injury.  He did not specifically have acute exacerbation of any neck pain at that time but then also notes there has been some numbness on the side of his neck and top of the shoulder.  Patient reports that it seemed to gone away but he was concerned and came to the emergency department for evaluation.  He reports EMS came and evaluated him at that time his symptoms were gone so he opted to drive himself to the hospital.  He later reported that he felt some degree of heaviness or incoordination of the right lower extremity as well although he was ambulatory without difficulty.    Home Medications Prior to Admission medications   Medication Sig Start Date End Date Taking? Authorizing Provider  metFORMIN (GLUCOPHAGE) 500 MG tablet Take 1 tablet (500 mg total) by mouth 2 (two) times daily with a meal. 1-2 times per day as tolerated. 05/18/23  Yes [name], MD      Allergies    Patient has no known allergies.    Review of Systems   Review of Systems  Physical Exam Updated Vital Signs BP (!) 160/104   Pulse 66   Temp 98.2 F (36.8 C) (Oral)   Resp (!) 22   Ht 5\' 10"  (1.778  m)   Wt 104.3 kg   SpO2 98%   BMI 33.00 kg/m  Physical Exam Constitutional:      Comments: Alert with clear mental status.  Speech is clear.  No respiratory distress.  Well nourished well-developed.  HENT:     Head: Normocephalic and atraumatic.     Mouth/Throat:     Mouth: Mucous membranes are moist.     Pharynx: Oropharynx is clear.  Eyes:     Extraocular Movements: Extraocular movements intact.     Conjunctiva/sclera: Conjunctivae normal.     Pupils: Pupils are equal, round, and reactive to light.  Cardiovascular:     Rate and Rhythm: Normal rate and regular rhythm.  Pulmonary:     Effort: Pulmonary effort is normal.     Breath sounds: Normal breath sounds.  Abdominal:     General: There is no distension.     Palpations: Abdomen is soft.     Tenderness: There is no abdominal tenderness. There is no guarding.  Musculoskeletal:        General: No swelling or tenderness. Normal range of motion.     Right lower leg: No edema.     Left lower leg: No edema.  Skin:    General: Skin is warm and dry.  Neurological:  General: No focal deficit present.     Mental Status: He is oriented to person, place, and time.     Cranial Nerves: No cranial nerve deficit.     Sensory: No sensory deficit.     Motor: No weakness.     Coordination: Coordination normal.     Gait: Gait normal.     Comments: On first examination patient has normal cognitive function.  Speech is clear.  No receptive or expressive aphasia.  Cranial nerves II through XII intact.  No pronator drift.  Motor strength 5\5 for grip and push pull on the upper extremities.  Motor strength 5 out of 5 for lower extremities.  Patient can stand and ambulate with coordinated gait.  Psychiatric:        Mood and Affect: Mood normal.     ED Results / Procedures / Treatments   Labs (all labs ordered are listed, but only abnormal results are displayed) Labs Reviewed  CBC - Abnormal; Notable for the following components:       Result Value   MCV 78.6 (*)    All other components within normal limits  COMPREHENSIVE METABOLIC PANEL - Abnormal; Notable for the following components:   Glucose, Bld 197 (*)    All other components within normal limits  CBG MONITORING, ED - Abnormal; Notable for the following components:   Glucose-Capillary 146 (*)    All other components within normal limits  I-STAT CHEM 8, ED - Abnormal; Notable for the following components:   Glucose, Bld 197 (*)    Calcium, Ion 1.14 (*)    All other components within normal limits  URINALYSIS, ROUTINE W REFLEX MICROSCOPIC  PROTIME-INR  APTT  DIFFERENTIAL  ETHANOL  CBG MONITORING, ED  CBG MONITORING, ED    EKG EKG Interpretation Date/Time:  Monday July 06 2023 16:01:30 EST Ventricular Rate:  67 PR Interval:  172 QRS Duration:  90 QT Interval:  386 QTC Calculation: 408 R Axis:   58  Text Interpretation: Sinus rhythm isolated inferior q wave, otherwise normal Confirmed by Arby Barrette [phone]) on 07/06/2023 5:11:33 PM  Radiology CT Angio Head Neck W WO CM  Result Date: 07/06/2023 CLINICAL DATA:  Numbness.  Acute neurologic deficit. EXAM: CT ANGIOGRAPHY HEAD AND NECK WITH AND WITHOUT CONTRAST TECHNIQUE: Multidetector CT imaging of the head and neck was performed using the standard protocol during bolus administration of intravenous contrast. Multiplanar CT image reconstructions and MIPs were obtained to evaluate the vascular anatomy. Carotid stenosis measurements (when applicable) are obtained utilizing NASCET criteria, using the distal internal carotid diameter as the denominator. RADIATION DOSE REDUCTION: This exam was performed according to the departmental dose-optimization program which includes automated exposure control, adjustment of the mA and/or kV according to patient size and/or use of iterative reconstruction technique. CONTRAST:  75mL OMNIPAQUE IOHEXOL 350 MG/ML SOLN COMPARISON:  None Available. FINDINGS: CTA NECK FINDINGS  SKELETON: No acute abnormality or high grade bony spinal canal stenosis. OTHER NECK: Normal pharynx, larynx and major salivary glands. No cervical lymphadenopathy. Unremarkable thyroid gland. UPPER CHEST: No pneumothorax or pleural effusion. No nodules or masses. AORTIC ARCH: There is no calcific atherosclerosis of the aortic arch. Conventional 3 vessel aortic branching pattern. RIGHT CAROTID SYSTEM: Normal without aneurysm, dissection or stenosis. LEFT CAROTID SYSTEM: Normal without aneurysm, dissection or stenosis. VERTEBRAL ARTERIES: Left dominant configuration. There is no dissection, occlusion or flow-limiting stenosis to the skull base (V1-V3 segments). CTA HEAD FINDINGS POSTERIOR CIRCULATION: --Vertebral arteries: Normal V4 segments. --Inferior cerebellar arteries: Normal. --  Basilar artery: Normal. --Superior cerebellar arteries: Normal. --Posterior cerebral arteries (PCA): Normal. ANTERIOR CIRCULATION: --Intracranial internal carotid arteries: Normal. --Anterior cerebral arteries (ACA): Normal. --Middle cerebral arteries (MCA): Normal. VENOUS SINUSES: As permitted by contrast timing, patent. ANATOMIC VARIANTS: None Review of the MIP images confirms the above findings. IMPRESSION: No emergent large vessel occlusion or high-grade stenosis of the cerebrovascular arteries. Electronically Signed   By: Deatra Robinson M.D.   On: 07/06/2023 20:49   CT HEAD WO CONTRAST  Result Date: 07/06/2023 CLINICAL DATA:  Neuro deficit, persistent or recurrent, CNS neoplasm suspected. Numbness. EXAM: CT HEAD WITHOUT CONTRAST TECHNIQUE: Contiguous axial images were obtained from the base of the skull through the vertex without intravenous contrast. RADIATION DOSE REDUCTION: This exam was performed according to the departmental dose-optimization program which includes automated exposure control, adjustment of the mA and/or kV according to patient size and/or use of iterative reconstruction technique. COMPARISON:  02/26/2016  FINDINGS: Brain: No evidence of acute infarction, hemorrhage, hydrocephalus, extra-axial collection or mass lesion/mass effect. Vascular: No hyperdense vessel or unexpected calcification. Skull: Normal. Negative for fracture or focal lesion. Calcification between the left occipital condyle and dense, possibly old avulsion injury. Sinuses/Orbits: No acute finding. IMPRESSION: Normal CT of the brain. Electronically Signed   By: Tiburcio Pea M.D.   On: 07/06/2023 18:52    Procedures Procedures   CRITICAL CARE Performed by: Arby Barrette   Total critical care time: 30 minutes  Critical care time was exclusive of separately billable procedures and treating other patients.  Critical care was necessary to treat or prevent imminent or life-threatening deterioration.  Critical care was time spent personally by me on the following activities: development of treatment plan with patient and/or surrogate as well as nursing, discussions with consultants, evaluation of patient's response to treatment, examination of patient, obtaining history from patient or surrogate, ordering and performing treatments and interventions, ordering and review of laboratory studies, ordering and review of radiographic studies, pulse oximetry and re-evaluation of patient's condition.  Medications Ordered in ED Medications  HYDROmorphone (DILAUDID) injection 0.5 mg (0.5 mg Intravenous Given 07/06/23 1926)  iohexol (OMNIPAQUE) 350 MG/ML injection 75 mL (75 mLs Intravenous Contrast Given 07/06/23 2024)    ED Course/ Medical Decision Making/ A&P                                 Medical Decision Making Amount and/or Complexity of Data Reviewed Labs: ordered. Radiology: ordered.  Risk Prescription drug management.  Presents as outlined with first onset of a tremor and then numbness in the right upper extremity.  Symptoms have resolved at the time of evaluation.  The patient does have some CVA risk factors with diabetes  and hypertension which are controlled.  Will proceed with stroke workup.  This time with symptoms resolved will not initiate code stroke.  CBC normal with normal differential urinalysis negative.  Metabolic panel normal except blood glucose 197  18: 40 recheck patient reports now he had noted some numbness on the side of his neck and the top of the trapezius on the right.  He feels like there is been some waxing and waning of a feeling of numbness in the arm.  Patient just recently got back from CT scan we will plan to consult neurology.  19: 05 reviewed with Dr. Iver Nestle.  We have reviewed symptoms and current CT findings.  Recommendation is to proceed with CT angio head and neck followed by  MRI brain and cervical spine.  CT angiogram interpreted radiology no acute obstructive lesions.  Reviewed with Dr. Rockne Coons with some consult in the emergency department.  At this time with current symptoms concern for small CVA.  Will proceed with the MRIs as planned and admit to medical service for stroke workup.  Patient is alert and well in appearance.  I have reviewed results and plan with the patient and his wife at bedside.        Final Clinical Impression(s) / ED Diagnoses Final diagnoses:  Cerebrovascular accident (CVA), unspecified mechanism (HCC)    Rx / DC Orders ED Discharge Orders     None         Arby Barrette, MD 07/06/23 2126

## 2023-07-06 NOTE — ED Notes (Signed)
Dr. Pfeiffer at bedside   

## 2023-07-06 NOTE — Consult Note (Signed)
NEUROLOGY CONSULT NOTE   Date of service: July 06, 2023 Patient Name: Brian Reid MRN:  [id number] DOB:  02-09-1976 Chief Complaint: "R hand termors, numb, R leg weakness" Requesting Provider: Arby Barrette, MD  History of Present Illness  Domenico Mcclary is a 47 y.o. male with a past medical history of diabetes, hypertension, hyperlipidemia, asthma, former smoker who presents with episode of right arm weakness associated with some tremoring along with right arm and right leg numbness.  He reports that today he had just started eating lunch when he noticed that his right hand suddenly felt like it was tremoring and felt very different and just dropped to the side.  He got up to wash his hands and noticed that of water felt different on his hand.  He immediately called 911 and felt that he was improving.  He was able to drive himself to the ED.  He reports being very anxious.  When he got to the ED, he reports that he was having trouble with his right leg too.  He is significantly improved at this time but still feels numbness in the right arm and lower right face.  He reports history of diabetes, hypertension, hyperlipidemia he is unsure of how well these are controlled but feels that he could do a better job.  He quit smoking several years ago.  He denies any prior history of atrial fibrillation.  LKW: 1400 on 07/06/23 Modified rankin score: 0-Completely asymptomatic and back to baseline post- stroke IV Thrombolysis: Not offered as he is outside the window and is too mild to treat.   EVT: Not offered as he is outside of window and is too mild to treat.  Also low suspicion for LVO.    NIHSS components Score: Comment  1a Level of Conscious 0[x]  1[]  2[]  3[]      1b LOC Questions 0[x]  1[]  2[]       1c LOC Commands 0[x]  1[]  2[]       2 Best Gaze 0[x]  1[]  2[]       3 Visual 0[x]  1[]  2[]  3[]      4 Facial Palsy 0[x]  1[]  2[]  3[]      5a Motor Arm - left 0[x]  1[]  2[]  3[]  4[]  UN[]    5b Motor Arm - Right  0[x]  1[]  2[]  3[]  4[]  UN[]    6a Motor Leg - Left 0[x]  1[]  2[]  3[]  4[]  UN[]    6b Motor Leg - Right 0[]  1[x]  2[]  3[]  4[]  UN[]  Slight drift in right leg  7 Limb Ataxia 0[x]  1[]  2[]  3[]  UN[]     8 Sensory 0[]  1[x]  2[]  UN[]    Slight reduced touch in right forearm  9 Best Language 0[x]  1[]  2[]  3[]      10 Dysarthria 0[x]  1[]  2[]  UN[]      11 Extinct. and Inattention 0[x]  1[]  2[]       TOTAL: 2      ROS  Comprehensive ROS performed and pertinent positives documented in HPI   Past History   Past Medical History:  Diagnosis Date   Asthma     Past Surgical History:  Procedure Laterality Date   APPENDECTOMY N/A    [employer] 06/27/2020   LAPAROSCOPIC APPENDECTOMY N/A 04/02/2016   Procedure: APPENDECTOMY LAPAROSCOPIC;  Surgeon: Chevis Pretty III, MD;  Location: [facility];  Service: General;  Laterality: N/A;   LEG SURGERY     age 28   OPEN REDUCTION SHOULDER DISLOCATION     age 67    Family History: Family History  Problem Relation Age of Onset  Diabetes Mother    Hypertension Mother    Stroke Mother    Diabetes Father    Heart disease Father    Hypertension Sister     Social History  reports that he has quit smoking. He has never used smokeless tobacco. He reports current alcohol use of about 2.0 standard drinks of alcohol per week. He reports that he does not use drugs.  No Known Allergies  Medications  No current facility-administered medications for this encounter.  Current Outpatient Medications:    metFORMIN (GLUCOPHAGE) 500 MG tablet, Take 1 tablet (500 mg total) by mouth 2 (two) times daily with a meal. 1-2 times per day as tolerated., Disp: 180 tablet, Rfl: 1  Vitals   Vitals:   04-Aug-2023 1830 August 04, 2023 1915 August 04, 2023 1922 August 04, 2023 2012  BP: (!) 153/100 (!) 168/91  (!) 173/96  Pulse: 62 70  70  Resp: 20 19  (!) 21  Temp:   98.2 F (36.8 C)   TempSrc:   Oral   SpO2: 96% 100%  100%  Weight:      Height:        Body mass index is 33 kg/m.  Physical Exam   General:  Laying comfortably in bed; in no acute distress.  HENT: Normal oropharynx and mucosa. Normal external appearance of ears and nose.  Neck: Supple, no pain or tenderness  CV: No JVD. No peripheral edema.  Pulmonary: Symmetric Chest rise. Normal respiratory effort.  Abdomen: Soft to touch, non-tender.  Ext: No cyanosis, edema, or deformity  Skin: No rash. Normal palpation of skin.   Musculoskeletal: Normal digits and nails by inspection. No clubbing.   Neurologic Examination  Mental status/Cognition: Alert, oriented to self, place, month and year, good attention.  Speech/language: Fluent, comprehension intact, object naming intact, repetition intact.  Cranial nerves:   CN II Pupils equal and reactive to light, no VF deficits    CN III,IV,VI EOM intact, no gaze preference or deviation, no nystagmus    CN V normal sensation in V1, V2, and V3 segments bilaterally    CN VII no asymmetry, no nasolabial fold flattening    CN VIII normal hearing to speech    CN IX & X normal palatal elevation, no uvular deviation    CN XI 5/5 head turn and 5/5 shoulder shrug bilaterally    CN XII midline tongue protrusion   Motor:  Muscle bulk: nromal, tone normal, pronator drift none tremor none Mvmt Root Nerve  Muscle Right Left Comments  SA C5/6 Ax Deltoid 5 5   EF C5/6 Mc Biceps 5 5   EE C6/7/8 Rad Triceps 4+ 5   WF C6/7 Med FCR     WE C7/8 PIN ECU     F Ab C8/T1 U ADM/FDI 4+ 5   HF L1/2/3 Fem Illopsoas 4 5   KE L2/3/4 Fem Quad 5 5   DF L4/5 D Peron Tib Ant 5 5   PF S1/2 Tibial Grc/Sol 5 5    Sensation:  Light touch Slightly decreased to touch in right arm compared to left arm.   Pin prick    Temperature    Vibration   Proprioception    Coordination/Complex Motor:  - Finger to Nose intact BL - Heel to shin intact BLa - Rapid alternating movement are normal - Gait: Deferred for patient safety. Labs/Imaging/Neurodiagnostic studies   CBC:  Recent Labs  Lab August 04, 2023 1522 2023/08/04 1535   WBC 6.3  --   NEUTROABS 3.5  --  HGB 14.7 15.0  HCT 44.0 44.0  MCV 78.6*  --   PLT 214  --    Basic Metabolic Panel:  Lab Results  Component Value Date   NA 138 07/06/2023   K 3.8 07/06/2023   CO2 26 07/06/2023   GLUCOSE 197 (H) 07/06/2023   BUN 11 07/06/2023   CREATININE 0.90 07/06/2023   CALCIUM 9.1 07/06/2023   GFRNONAA >60 07/06/2023   GFRAA >89 04/02/2016   Lipid Panel:  Lab Results  Component Value Date   LDLCALC 113 (H) 07/19/2021   HgbA1c:  Lab Results  Component Value Date   HGBA1C 7.6 (H) 03/20/2023   Urine Drug Screen: No results found for: "LABOPIA", "COCAINSCRNUR", "LABBENZ", "AMPHETMU", "THCU", "LABBARB"  Alcohol Level     Component Value Date/Time   ETH <10 07/06/2023 1522   INR  Lab Results  Component Value Date   INR 1.0 07/06/2023   APTT  Lab Results  Component Value Date   APTT 26 07/06/2023   AED levels: No results found for: "PHENYTOIN", "ZONISAMIDE", "LAMOTRIGINE", "LEVETIRACETA"  CT Head without contrast(Personally reviewed): CTH was negative for a large hypodensity concerning for a large territory infarct or hyperdensity concerning for an ICH  CT angio Head and Neck with contrast(Personally reviewed): No LVO, no high grade stenosis.  MRI Brain: pending  MRI C-spine without contrast: pending  Neurodiagnostics rEEG:  pending  ASSESSMENT   Jefri Pezza is a 47 y.o. male with a past medical history of diabetes, hypertension, hyperlipidemia, asthma, former smoker who presents with episode of right arm weakness associated with some tremoring along with right arm and right leg numbness and weakness. Symptoms significantly improved but persistent mild R leg weakness and slight R arm decreased sensation to touch.  Presentation is most concerning for a small vessel stroke vs limb shaking TIA, focal seizure also on the differential, thou less likely.  RECOMMENDATIONS   Frequent Neuro checks per stroke unit protocol - Recommend  brain imaging with MRI Brain without contrast - Recommend obtaining TTE - Recommend obtaining Lipid panel with LDL - Please start statin if LDL > 70 - Recommend HbA1c to evaluate for diabetes and how well it is controlled. - Antithrombotic -  aspirin 81 mg daily along with Plavix 75 mg daily for 21 days, followed by aspirin 81 mg daily alone. - Recommend DVT ppx - SBP goal - permissive hypertension first 24 h < 220/110. Held home meds.  - Recommend Telemetry monitoring for arrythmia - Recommend bedside swallow screen prior to PO intake. - Stroke education booklet - Recommend PT/OT/SLP consult - rEEG in addition to above, given shaking of R hand at the time of onset.  ______________________________________________________________________    Signed, Erick Blinks, MD [facility]

## 2023-07-06 NOTE — ED Triage Notes (Addendum)
Pt to ED c/o right side numbness that started aprox 1 hour ago. Reports right hand and arm became numb/tinglingfirst, and then right leg. Pt has full ROM. No other neuro deficits noted. On touch pt reports same sensation on right and left side. Pt anxious in triage

## 2023-07-06 NOTE — ED Notes (Signed)
Assumed carte of patient.

## 2023-07-06 NOTE — ED Notes (Addendum)
TO CT

## 2023-07-06 NOTE — H&P (Incomplete)
PCP:   [name], MD   Chief Complaint:  Right sided weakness and numbness.  HPI: This is a 47 year old male with past medical history significant for T2DM, diet controlled HTN/HLD.  Today while while having lunch he found he was having difficulty moving his right arm.  His arm was tremulous and he was unable to hold anything.  He additionally had right upper extremity numbness.  Last night he states he was lightheaded and dizzy.  He drove himself to the ER.  On arrival he found he was having difficulty walking.  His right lower extremity was numb had some weakness as did his right hip and the right side of his face.  He denies headaches, nausea, vomiting.  He denies slurred speech, vision changes.  He denies history of migraine.  In the ER patient's presenting vitals 161/113, 73, 18, afebrile.  CT brain/CTA head and neck unrevealing.  EKG NSR, normal QTc.  Neurology consult placed.  Review of Systems:  Per HPI  Past Medical History: Past Medical History:  Diagnosis Date   Asthma    Past Surgical History:  Procedure Laterality Date   APPENDECTOMY N/A    [employer] 06/27/2020   LAPAROSCOPIC APPENDECTOMY N/A 04/02/2016   Procedure: APPENDECTOMY LAPAROSCOPIC;  Surgeon: Chevis Pretty III, MD;  Location: [facility];  Service: General;  Laterality: N/A;   LEG SURGERY     age 45   OPEN REDUCTION SHOULDER DISLOCATION     age 61    Medications: Prior to Admission medications   Medication Sig Start Date End Date Taking? Authorizing Provider  metFORMIN (GLUCOPHAGE) 500 MG tablet Take 1 tablet (500 mg total) by mouth 2 (two) times daily with a meal. 1-2 times per day as tolerated. 05/18/23  Yes [name], MD    Allergies:  No Known Allergies  Social History:  reports that he has quit smoking. He has never used smokeless tobacco. He reports current alcohol use of about 2.0 standard drinks of alcohol per week. He reports that he does not use drugs.  Family History: Family History   Problem Relation Age of Onset   Diabetes Mother    Hypertension Mother    Stroke Mother    Diabetes Father    Heart disease Father    Hypertension Sister     Physical Exam: Vitals:   07/06/23 1922 07/06/23 2012 07/06/23 2030 07/06/23 2100  BP:  (!) 173/96 (!) 146/119 (!) 160/104  Pulse:  70 78 66  Resp:  (!) 21 (!) 24 (!) 22  Temp: 98.2 F (36.8 C)     TempSrc: Oral     SpO2:  100% 99% 98%  Weight:      Height:        General:  Alert and oriented times three, well developed and nourished, no acute distress Eyes: PERRLA, pink conjunctiva, no scleral icterus ENT: Moist oral mucosa, neck supple, no thyromegaly Lungs: clear to ascultation, no wheeze, no crackles, no use of accessory muscles Cardiovascular: regular rate and rhythm, no regurgitation, no gallops, no murmurs. No carotid bruits, no JVD Abdomen: soft, positive BS, non-tender, non-distended, no organomegaly, not an acute abdomen GU: not examined Neuro: CN II - XII grossly intact, mildly decreased sensation right face/LE/UE Musculoskeletal: strength RLE 4.5/5, all other extremity 5/5, no edema Skin: no rash, no subcutaneous crepitation, no decubitus Psych: appropriate patient   Labs on Admission:  Recent Labs    07/06/23 1522 07/06/23 1535  NA 135 138  K 3.8 3.8  CL 100 98  CO2 26  --   GLUCOSE 197* 197*  BUN 11 11  CREATININE 0.95 0.90  CALCIUM 9.1  --    Recent Labs    07/06/23 1522  AST 27  ALT 44  ALKPHOS 46  BILITOT 0.9  PROT 6.7  ALBUMIN 3.9   Recent Labs    07/06/23 1522 07/06/23 1535  WBC 6.3  --   NEUTROABS 3.5  --   HGB 14.7 15.0  HCT 44.0 44.0  MCV 78.6*  --   PLT 214  --     Radiological Exams on Admission: CT Angio Head Neck W WO CM  Result Date: 07/06/2023 CLINICAL DATA:  Numbness.  Acute neurologic deficit. EXAM: CT ANGIOGRAPHY HEAD AND NECK WITH AND WITHOUT CONTRAST TECHNIQUE: Multidetector CT imaging of the head and neck was performed using the standard protocol during  bolus administration of intravenous contrast. Multiplanar CT image reconstructions and MIPs were obtained to evaluate the vascular anatomy. Carotid stenosis measurements (when applicable) are obtained utilizing NASCET criteria, using the distal internal carotid diameter as the denominator. RADIATION DOSE REDUCTION: This exam was performed according to the departmental dose-optimization program which includes automated exposure control, adjustment of the mA and/or kV according to patient size and/or use of iterative reconstruction technique. CONTRAST:  75mL OMNIPAQUE IOHEXOL 350 MG/ML SOLN COMPARISON:  None Available. FINDINGS: CTA NECK FINDINGS SKELETON: No acute abnormality or high grade bony spinal canal stenosis. OTHER NECK: Normal pharynx, larynx and major salivary glands. No cervical lymphadenopathy. Unremarkable thyroid gland. UPPER CHEST: No pneumothorax or pleural effusion. No nodules or masses. AORTIC ARCH: There is no calcific atherosclerosis of the aortic arch. Conventional 3 vessel aortic branching pattern. RIGHT CAROTID SYSTEM: Normal without aneurysm, dissection or stenosis. LEFT CAROTID SYSTEM: Normal without aneurysm, dissection or stenosis. VERTEBRAL ARTERIES: Left dominant configuration. There is no dissection, occlusion or flow-limiting stenosis to the skull base (V1-V3 segments). CTA HEAD FINDINGS POSTERIOR CIRCULATION: --Vertebral arteries: Normal V4 segments. --Inferior cerebellar arteries: Normal. --Basilar artery: Normal. --Superior cerebellar arteries: Normal. --Posterior cerebral arteries (PCA): Normal. ANTERIOR CIRCULATION: --Intracranial internal carotid arteries: Normal. --Anterior cerebral arteries (ACA): Normal. --Middle cerebral arteries (MCA): Normal. VENOUS SINUSES: As permitted by contrast timing, patent. ANATOMIC VARIANTS: None Review of the MIP images confirms the above findings. IMPRESSION: No emergent large vessel occlusion or high-grade stenosis of the cerebrovascular  arteries. Electronically Signed   By: Deatra Robinson M.D.   On: 07/06/2023 20:49   CT HEAD WO CONTRAST  Result Date: 07/06/2023 CLINICAL DATA:  Neuro deficit, persistent or recurrent, CNS neoplasm suspected. Numbness. EXAM: CT HEAD WITHOUT CONTRAST TECHNIQUE: Contiguous axial images were obtained from the base of the skull through the vertex without intravenous contrast. RADIATION DOSE REDUCTION: This exam was performed according to the departmental dose-optimization program which includes automated exposure control, adjustment of the mA and/or kV according to patient size and/or use of iterative reconstruction technique. COMPARISON:  02/26/2016 FINDINGS: Brain: No evidence of acute infarction, hemorrhage, hydrocephalus, extra-axial collection or mass lesion/mass effect. Vascular: No hyperdense vessel or unexpected calcification. Skull: Normal. Negative for fracture or focal lesion. Calcification between the left occipital condyle and dense, possibly old avulsion injury. Sinuses/Orbits: No acute finding. IMPRESSION: Normal CT of the brain. Electronically Signed   By: Tiburcio Pea M.D.   On: 07/06/2023 18:52    Assessment/Plan Present on Admission: CVA -TIA order set initiated -MRI head, 2D echo ordered -Neurochecks every 2 hours,  -lipid panel ordered, statin "LDL -Permissive hypertension keep SBP<220, DBP<119 -Appreciate  neuro's input.  Baby aspirin and Plavix recommended, started. -C-spine MRI ordered  T2DM -Sliding scale insulin.  Brian Reid 07/06/2023, 9:31 PM

## 2023-07-06 NOTE — ED Notes (Addendum)
Back from CT. Dr Ezzie Dural, neurologist, at bedside

## 2023-07-07 ENCOUNTER — Observation Stay (HOSPITAL_BASED_OUTPATIENT_CLINIC_OR_DEPARTMENT_OTHER): Payer: Managed Care, Other (non HMO)

## 2023-07-07 ENCOUNTER — Observation Stay (HOSPITAL_COMMUNITY): Payer: Managed Care, Other (non HMO)

## 2023-07-07 DIAGNOSIS — R531 Weakness: Secondary | ICD-10-CM

## 2023-07-07 DIAGNOSIS — R251 Tremor, unspecified: Secondary | ICD-10-CM | POA: Diagnosis not present

## 2023-07-07 DIAGNOSIS — G459 Transient cerebral ischemic attack, unspecified: Secondary | ICD-10-CM

## 2023-07-07 DIAGNOSIS — R569 Unspecified convulsions: Secondary | ICD-10-CM | POA: Diagnosis not present

## 2023-07-07 LAB — CBC
HCT: 40.3 % (ref 39.0–52.0)
HCT: 42.4 % (ref 39.0–52.0)
Hemoglobin: 13.3 g/dL (ref 13.0–17.0)
Hemoglobin: 13.7 g/dL (ref 13.0–17.0)
MCH: 25.5 pg — ABNORMAL LOW (ref 26.0–34.0)
MCH: 25.1 pg — ABNORMAL LOW (ref 26.0–34.0)
MCHC: 33 g/dL (ref 30.0–36.0)
MCHC: 32.3 g/dL (ref 30.0–36.0)
MCV: 77.4 fL — ABNORMAL LOW (ref 80.0–100.0)
MCV: 77.7 fL — ABNORMAL LOW (ref 80.0–100.0)
Platelets: 187 10*3/uL (ref 150–400)
Platelets: 192 10*3/uL (ref 150–400)
RBC: 5.21 MIL/uL (ref 4.22–5.81)
RBC: 5.46 MIL/uL (ref 4.22–5.81)
RDW: 12.9 % (ref 11.5–15.5)
RDW: 13.1 % (ref 11.5–15.5)
WBC: 7.4 10*3/uL (ref 4.0–10.5)
WBC: 6.2 10*3/uL (ref 4.0–10.5)
nRBC: 0 % (ref 0.0–0.2)
nRBC: 0 % (ref 0.0–0.2)

## 2023-07-07 LAB — CREATININE, SERUM
Creatinine, Ser: 0.93 mg/dL (ref 0.61–1.24)
GFR, Estimated: >60 mL/min (ref >60)

## 2023-07-07 LAB — CBG MONITORING, ED
Glucose-Capillary: 143 mg/dL — ABNORMAL HIGH (ref 70–99)
Glucose-Capillary: 148 mg/dL — ABNORMAL HIGH (ref 70–99)
Glucose-Capillary: 200 mg/dL — ABNORMAL HIGH (ref 70–99)

## 2023-07-07 LAB — LIPID PANEL
Cholesterol: 206 mg/dL — ABNORMAL HIGH (ref 0–200)
HDL: 30 mg/dL — ABNORMAL LOW (ref >40)
LDL Cholesterol: 109 mg/dL — ABNORMAL HIGH (ref 0–99)
Total CHOL/HDL Ratio: 6.9 {ratio}
Triglycerides: 333 mg/dL — ABNORMAL HIGH (ref <150)
VLDL: 67 mg/dL — ABNORMAL HIGH (ref 0–40)

## 2023-07-07 LAB — ECHOCARDIOGRAM COMPLETE BUBBLE STUDY
AR max vel: 2.33 cm2
AV Peak grad: 6.1 mm[Hg]
Ao pk vel: 1.23 m/s
Area-P 1/2: 3.37 cm2
S' Lateral: 3 cm

## 2023-07-07 LAB — GLUCOSE, CAPILLARY
Glucose-Capillary: 124 mg/dL — ABNORMAL HIGH (ref 70–99)
Glucose-Capillary: 249 mg/dL — ABNORMAL HIGH (ref 70–99)

## 2023-07-07 MED ORDER — LEVETIRACETAM 500 MG PO TABS
500.0000 mg | ORAL_TABLET | Freq: Two times a day (BID) | ORAL | Status: DC
  Administered 2023-07-07 – 2023-07-08 (×3): 500 mg via ORAL
  Filled 2023-07-07 (×3): qty 1

## 2023-07-07 MED ORDER — ATORVASTATIN CALCIUM 40 MG PO TABS
40.0000 mg | ORAL_TABLET | Freq: Every day | ORAL | Status: DC
  Administered 2023-07-07 – 2023-07-08 (×2): 40 mg via ORAL
  Filled 2023-07-07 (×2): qty 1

## 2023-07-07 NOTE — Progress Notes (Signed)
PROGRESS NOTE    Brian Reid  FIE:[id number] DOB: 02/20/1976 DOA: 07/06/2023 PCP: [name], MD    Brief Narrative:  47 year old male with past medical history significant for T2DM, diet controlled HTN/HLD. Today while while having lunch he found he was having difficulty moving his right arm. His arm was tremulous and he was unable to hold anything. He additionally had right upper extremity numbness. Last night he states he was lightheaded and dizzy. He drove himself to the ER. On arrival he found he was having difficulty walking. His right lower extremity was numb had some weakness as did his right hip and the right side of his face.    Assessment and Plan: CVA -MRI- positive for CVA -echo: done -CTA done -Neurochecks every 2 hours,  -LDL: 109 -Permissive hypertension keep SBP<220, DBP<119 -Appreciate neuro's input.  Baby aspirin and Plavix recommended, started. -outpatient PT-- TOC consulted    T2DM -Sliding scale insulin.   DVT prophylaxis: enoxaparin (LOVENOX) injection 40 mg Start: 07/06/23 2145    Code Status: Full Code Family Communication:   Disposition Plan:  Level of care: Telemetry Medical Status is: Observation The patient remains OBS appropriate and will d/c before 2 midnights.    Consultants:  neurology   Subjective: tired  Objective: Vitals:   07/07/23 0830 07/07/23 0845 07/07/23 1000 07/07/23 1200  BP:   (!) 135/100   Pulse: (!) 56 60 67   Resp: 15 18 19    Temp:    98.2 F (36.8 C)  TempSrc:    Oral  SpO2: 97% 96% 93%   Weight:      Height:       No intake or output data in the 24 hours ending 07/07/23 1316 Filed Weights   07/06/23 1510  Weight: 104.3 kg    Examination:   General: Appearance:    Obese male in no acute distress     Lungs:     respirations unlabored  Heart:    Normal heart rate. Normal rhythm. No murmurs, rubs, or gallops  MS:   All extremities are intact.    Neurologic:   Awake, alert       Data  Reviewed: I have personally reviewed following labs and imaging studies  CBC: Recent Labs  Lab 07/06/23 1522 07/06/23 1535 07/07/23 0041 07/07/23 0640  WBC 6.3  --  7.4 6.2  NEUTROABS 3.5  --   --   --   HGB 14.7 15.0 13.3 13.7  HCT 44.0 44.0 40.3 42.4  MCV 78.6*  --  77.4* 77.7*  PLT 214  --  187 192   Basic Metabolic Panel: Recent Labs  Lab 07/06/23 1522 07/06/23 1535 07/07/23 0041  NA 135 138  --   K 3.8 3.8  --   CL 100 98  --   CO2 26  --   --   GLUCOSE 197* 197*  --   BUN 11 11  --   CREATININE 0.95 0.90 0.93  CALCIUM 9.1  --   --    GFR: Estimated Creatinine Clearance: 118.8 mL/min (by C-G formula based on SCr of 0.93 mg/dL). Liver Function Tests: Recent Labs  Lab 07/06/23 1522  AST 27  ALT 44  ALKPHOS 46  BILITOT 0.9  PROT 6.7  ALBUMIN 3.9   No results for input(s): "LIPASE", "AMYLASE" in the last 168 hours. No results for input(s): "AMMONIA" in the last 168 hours. Coagulation Profile: Recent Labs  Lab 07/06/23 1522  INR 1.0   Cardiac  Enzymes: No results for input(s): "CKTOTAL", "CKMB", "CKMBINDEX", "TROPONINI" in the last 168 hours. BNP (last 3 results) No results for input(s): "PROBNP" in the last 8760 hours. HbA1C: No results for input(s): "HGBA1C" in the last 72 hours. CBG: Recent Labs  Lab 07/06/23 1510 07/07/23 0049 07/07/23 0940  GLUCAP 146* 200* 148*   Lipid Profile: Recent Labs    07/07/23 0640  CHOL 206*  HDL 30*  LDLCALC 109*  TRIG 333*  CHOLHDL 6.9   Thyroid Function Tests: No results for input(s): "TSH", "T4TOTAL", "FREET4", "T3FREE", "THYROIDAB" in the last 72 hours. Anemia Panel: No results for input(s): "VITAMINB12", "FOLATE", "FERRITIN", "TIBC", "IRON", "RETICCTPCT" in the last 72 hours. Sepsis Labs: No results for input(s): "PROCALCITON", "LATICACIDVEN" in the last 168 hours.  No results found for this or any previous visit (from the past 240 hour(s)).       Radiology Studies: ECHOCARDIOGRAM COMPLETE  BUBBLE STUDY  Result Date: 07/07/2023    ECHOCARDIOGRAM REPORT   Patient Name:   Brian Reid Date of Exam: 07/07/2023 Medical Rec #:  [id number]    Height:       70.0 in Accession #:    [id number]   Weight:       230.0 lb Date of Birth:  [birth year]     BSA:          2.215 m Patient Age:    47 years     BP:           123/88 mmHg Patient Gender: M            HR:           75 bpm. Exam Location:  Inpatient Procedure: 2D Echo, Cardiac Doppler, Color Doppler and Saline Contrast Bubble            Study Indications:    TIA 435.9/G45.9  History:        Patient has no prior history of Echocardiogram examinations.                 TIA.  Sonographer:    Lucendia Herrlich RCS Referring Phys: [phone] DEBBY CROSLEY IMPRESSIONS  1. Left ventricular ejection fraction, by estimation, is 60 to 65%. The left ventricle has normal function. The left ventricle has no regional wall motion abnormalities. Left ventricular diastolic parameters are indeterminate.  2. Right ventricular systolic function is normal. The right ventricular size is normal.  3. The mitral valve is grossly normal. No evidence of mitral valve regurgitation.  4. The aortic valve is tricuspid. Aortic valve regurgitation is not visualized. No aortic stenosis is present.  5. The inferior vena cava is normal in size with greater than 50% respiratory variability, suggesting right atrial pressure of 3 mmHg.  6. Agitated saline contrast bubble study was negative, with no evidence of any interatrial shunt. Comparison(s): No prior Echocardiogram. FINDINGS  Left Ventricle: Left ventricular ejection fraction, by estimation, is 60 to 65%. The left ventricle has normal function. The left ventricle has no regional wall motion abnormalities. The left ventricular internal cavity size was normal in size. There is  no left ventricular hypertrophy. Left ventricular diastolic parameters are indeterminate. Right Ventricle: The right ventricular size is normal. No increase in right ventricular  wall thickness. Right ventricular systolic function is normal. Left Atrium: Left atrial size was normal in size. Right Atrium: Right atrial size was normal in size. Pericardium: Trivial pericardial effusion is present. The pericardial effusion is posterior to the left ventricle. Mitral Valve: The mitral valve  is grossly normal. No evidence of mitral valve regurgitation. Tricuspid Valve: The tricuspid valve is normal in structure. Tricuspid valve regurgitation is not demonstrated. No evidence of tricuspid stenosis. Aortic Valve: The aortic valve is tricuspid. Aortic valve regurgitation is not visualized. No aortic stenosis is present. Aortic valve peak gradient measures 6.1 mmHg. Pulmonic Valve: The pulmonic valve was normal in structure. Pulmonic valve regurgitation is not visualized. No evidence of pulmonic stenosis. Aorta: The aortic root and ascending aorta are structurally normal, with no evidence of dilitation. Venous: The inferior vena cava is normal in size with greater than 50% respiratory variability, suggesting right atrial pressure of 3 mmHg. IAS/Shunts: No atrial level shunt detected by color flow Doppler. Agitated saline contrast was given intravenously to evaluate for intracardiac shunting. Agitated saline contrast bubble study was negative, with no evidence of any interatrial shunt.  LEFT VENTRICLE PLAX 2D LVIDd:         4.70 cm   Diastology LVIDs:         3.00 cm   LV e' medial:    6.85 cm/s LV PW:         0.90 cm   LV E/e' medial:  8.9 LV IVS:        0.80 cm   LV e' lateral:   8.49 cm/s LVOT diam:     2.00 cm   LV E/e' lateral: 7.2 LV SV:         58 LV SV Index:   26 LVOT Area:     3.14 cm  RIGHT VENTRICLE             IVC RV S prime:     15.70 cm/s  IVC diam: 1.70 cm TAPSE (M-mode): 1.7 cm LEFT ATRIUM             Index        RIGHT ATRIUM           Index LA diam:        3.70 cm 1.67 cm/m   RA Area:     14.20 cm LA Vol (A2C):   35.7 ml 16.12 ml/m  RA Volume:   31.40 ml  14.18 ml/m LA Vol (A4C):    34.0 ml 15.35 ml/m LA Biplane Vol: 35.0 ml 15.80 ml/m  AORTIC VALVE AV Area (Vmax): 2.33 cm AV Vmax:        123.00 cm/s AV Peak Grad:   6.1 mmHg LVOT Vmax:      91.30 cm/s LVOT Vmean:     58.500 cm/s LVOT VTI:       0.186 m  AORTA Ao Root diam: 3.50 cm Ao Asc diam:  3.50 cm MITRAL VALVE               TRICUSPID VALVE MV Area (PHT): 3.37 cm    TR Peak grad:   5.1 mmHg MV Decel Time: 225 msec    TR Vmax:        113.00 cm/s MV E velocity: 61.10 cm/s MV A velocity: 79.10 cm/s  SHUNTS MV E/A ratio:  0.77        Systemic VTI:  0.19 m                            Systemic Diam: 2.00 cm Riley Lam MD Electronically signed by Riley Lam MD Signature Date/Time: 07/07/2023/12:40:37 PM    Final    EEG adult  Result Date: 07/07/2023 Charlsie Quest, MD  07/07/2023  8:48 AM Patient Name: Brian Reid MRN: [id number] Epilepsy Attending: Charlsie Quest Referring Physician/Provider: Erick Blinks, MD Date: 07/07/2023 Duration: 24.15 mins Patient history: 47 y.o. male who presents with episode of right arm weakness associated with some tremoring along with right arm and right leg numbness and weakness. EEG to evaluate for seizure Level of alertness: Awake, asleep AEDs during EEG study: None Technical aspects: This EEG study was done with scalp electrodes positioned according to the 10-20 International system of electrode placement. Electrical activity was reviewed with band pass filter of 1-70Hz , sensitivity of 7 uV/mm, display speed of 88mm/sec with a 60Hz  notched filter applied as appropriate. EEG data were recorded continuously and digitally stored.  Video monitoring was available and reviewed as appropriate. Description: The posterior dominant rhythm consists of 9 Hz activity of moderate voltage (25-35 uV) seen predominantly in posterior head regions, symmetric and reactive to eye opening and eye closing. Sleep was characterized by vertex waves, sleep spindles (12 to 14 Hz), maximal frontocentral  region. Hyperventilation and photic stimulation were not performed.   IMPRESSION: This study is within normal limits. No seizures or epileptiform discharges were seen throughout the recording. A normal interictal EEG does not exclude the diagnosis of epilepsy. Charlsie Quest   MR BRAIN WO CONTRAST  Result Date: 07/07/2023 CLINICAL DATA:  Acute neurologic deficit. Right upper extremity tremor. EXAM: MRI HEAD WITHOUT CONTRAST MRI CERVICAL SPINE WITHOUT CONTRAST TECHNIQUE: Multiplanar, multiecho pulse sequences of the brain and surrounding structures, and cervical spine, to include the craniocervical junction and cervicothoracic junction, were obtained without intravenous contrast. Six sequences of the brain are provided. COMPARISON:  03/12/2019 FINDINGS: MRI HEAD FINDINGS Brain: There is a focus of acute/early subacute ischemia at the junction of the left precentral and postcentral gyri. No chronic microhemorrhage or siderosis. Normal white matter signal, parenchymal volume and CSF spaces. The midline structures are normal. Vascular: Normal flow voids. Skull and upper cervical spine: Normal marrow signal. Sinuses/Orbits: Negative. Other: None. MRI CERVICAL SPINE FINDINGS Alignment: Physiologic. Vertebrae: No fracture, evidence of discitis, or bone lesion. Cord: Normal signal and morphology. Posterior Fossa, vertebral arteries, paraspinal tissues: Negative. Disc levels: C1-2: Unremarkable. C2-3: Normal disc space and facet joints. There is no spinal canal stenosis. No neural foraminal stenosis. C3-4: Normal disc space and facet joints. There is no spinal canal stenosis. No neural foraminal stenosis. C4-5: Small right subarticular disc protrusion with endplate osteophytes. Mild spinal canal stenosis. Mild right neural foraminal stenosis. C5-6: Unchanged intermediate sized right uncovertebral disc osteophyte complex. Mild spinal canal stenosis. Mild right neural foraminal stenosis. C6-7: Unchanged intermediate size  right subarticular/foraminal disc osteophyte complex with right foraminal disc extrusion with mild superior migration. Mild spinal canal stenosis. Moderate bilateral neural foraminal stenosis. C7-T1: Normal disc space and facet joints. There is no spinal canal stenosis. No neural foraminal stenosis. IMPRESSION: 1. Focus of acute/early subacute ischemia at the junction of the left precentral and postcentral gyri. 2. 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 3. Moderate bilateral C7 and mild right C5, C6 neural foraminal stenosis. Electronically Signed   By: Deatra Robinson M.D.   On: 07/07/2023 00:56   MR Cervical Spine Wo Contrast  Result Date: 07/07/2023 CLINICAL DATA:  Acute neurologic deficit. Right upper extremity tremor. EXAM: MRI HEAD WITHOUT CONTRAST MRI CERVICAL SPINE WITHOUT CONTRAST TECHNIQUE: Multiplanar, multiecho pulse sequences of the brain and surrounding structures, and cervical spine, to include the craniocervical junction and cervicothoracic junction, were obtained without intravenous contrast. Six  sequences of the brain are provided. COMPARISON:  03/12/2019 FINDINGS: MRI HEAD FINDINGS Brain: There is a focus of acute/early subacute ischemia at the junction of the left precentral and postcentral gyri. No chronic microhemorrhage or siderosis. Normal white matter signal, parenchymal volume and CSF spaces. The midline structures are normal. Vascular: Normal flow voids. Skull and upper cervical spine: Normal marrow signal. Sinuses/Orbits: Negative. Other: None. MRI CERVICAL SPINE FINDINGS Alignment: Physiologic. Vertebrae: No fracture, evidence of discitis, or bone lesion. Cord: Normal signal and morphology. Posterior Fossa, vertebral arteries, paraspinal tissues: Negative. Disc levels: C1-2: Unremarkable. C2-3: Normal disc space and facet joints. There is no spinal canal stenosis. No neural foraminal stenosis. C3-4: Normal disc space and facet joints. There is no spinal canal  stenosis. No neural foraminal stenosis. C4-5: Small right subarticular disc protrusion with endplate osteophytes. Mild spinal canal stenosis. Mild right neural foraminal stenosis. C5-6: Unchanged intermediate sized right uncovertebral disc osteophyte complex. Mild spinal canal stenosis. Mild right neural foraminal stenosis. C6-7: Unchanged intermediate size right subarticular/foraminal disc osteophyte complex with right foraminal disc extrusion with mild superior migration. Mild spinal canal stenosis. Moderate bilateral neural foraminal stenosis. C7-T1: Normal disc space and facet joints. There is no spinal canal stenosis. No neural foraminal stenosis. IMPRESSION: 1. Focus of acute/early subacute ischemia at the junction of the left precentral and postcentral gyri. 2. 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 3. Moderate bilateral C7 and mild right C5, C6 neural foraminal stenosis. Electronically Signed   By: Deatra Robinson M.D.   On: 07/07/2023 00:56   DG Shoulder Right Portable  Result Date: 07/06/2023 CLINICAL DATA:  Right shoulder pain.  Prior surgical repair. EXAM: RIGHT SHOULDER - 1 VIEW COMPARISON:  Right shoulder radiograph dated 06/27/2020. FINDINGS: Fractured screw in the glenoid as seen previously. There is no acute fracture or dislocation. Mild arthritic changes of the glenohumeral joint. The soft tissues are unremarkable. IMPRESSION: 1. No acute fracture or dislocation. 2. Fractured screw in the glenoid. Electronically Signed   By: Elgie Collard M.D.   On: 07/06/2023 22:29   CT Angio Head Neck W WO CM  Result Date: 07/06/2023 CLINICAL DATA:  Numbness.  Acute neurologic deficit. EXAM: CT ANGIOGRAPHY HEAD AND NECK WITH AND WITHOUT CONTRAST TECHNIQUE: Multidetector CT imaging of the head and neck was performed using the standard protocol during bolus administration of intravenous contrast. Multiplanar CT image reconstructions and MIPs were obtained to evaluate the vascular  anatomy. Carotid stenosis measurements (when applicable) are obtained utilizing NASCET criteria, using the distal internal carotid diameter as the denominator. RADIATION DOSE REDUCTION: This exam was performed according to the departmental dose-optimization program which includes automated exposure control, adjustment of the mA and/or kV according to patient size and/or use of iterative reconstruction technique. CONTRAST:  75mL OMNIPAQUE IOHEXOL 350 MG/ML SOLN COMPARISON:  None Available. FINDINGS: CTA NECK FINDINGS SKELETON: No acute abnormality or high grade bony spinal canal stenosis. OTHER NECK: Normal pharynx, larynx and major salivary glands. No cervical lymphadenopathy. Unremarkable thyroid gland. UPPER CHEST: No pneumothorax or pleural effusion. No nodules or masses. AORTIC ARCH: There is no calcific atherosclerosis of the aortic arch. Conventional 3 vessel aortic branching pattern. RIGHT CAROTID SYSTEM: Normal without aneurysm, dissection or stenosis. LEFT CAROTID SYSTEM: Normal without aneurysm, dissection or stenosis. VERTEBRAL ARTERIES: Left dominant configuration. There is no dissection, occlusion or flow-limiting stenosis to the skull base (V1-V3 segments). CTA HEAD FINDINGS POSTERIOR CIRCULATION: --Vertebral arteries: Normal V4 segments. --Inferior cerebellar arteries: Normal. --Basilar artery: Normal. --Superior cerebellar  arteries: Normal. --Posterior cerebral arteries (PCA): Normal. ANTERIOR CIRCULATION: --Intracranial internal carotid arteries: Normal. --Anterior cerebral arteries (ACA): Normal. --Middle cerebral arteries (MCA): Normal. VENOUS SINUSES: As permitted by contrast timing, patent. ANATOMIC VARIANTS: None Review of the MIP images confirms the above findings. IMPRESSION: No emergent large vessel occlusion or high-grade stenosis of the cerebrovascular arteries. Electronically Signed   By: Deatra Robinson M.D.   On: 07/06/2023 20:49   CT HEAD WO CONTRAST  Result Date: 07/06/2023 CLINICAL  DATA:  Neuro deficit, persistent or recurrent, CNS neoplasm suspected. Numbness. EXAM: CT HEAD WITHOUT CONTRAST TECHNIQUE: Contiguous axial images were obtained from the base of the skull through the vertex without intravenous contrast. RADIATION DOSE REDUCTION: This exam was performed according to the departmental dose-optimization program which includes automated exposure control, adjustment of the mA and/or kV according to patient size and/or use of iterative reconstruction technique. COMPARISON:  02/26/2016 FINDINGS: Brain: No evidence of acute infarction, hemorrhage, hydrocephalus, extra-axial collection or mass lesion/mass effect. Vascular: No hyperdense vessel or unexpected calcification. Skull: Normal. Negative for fracture or focal lesion. Calcification between the left occipital condyle and dense, possibly old avulsion injury. Sinuses/Orbits: No acute finding. IMPRESSION: Normal CT of the brain. Electronically Signed   By: Tiburcio Pea M.D.   On: 07/06/2023 18:52        Scheduled Meds:  aspirin EC  81 mg Oral Daily   clopidogrel  75 mg Oral Daily   enoxaparin (LOVENOX) injection  40 mg Subcutaneous Q2000   insulin aspart  0-15 Units Subcutaneous TID WC   insulin aspart  0-5 Units Subcutaneous QHS   levETIRAcetam  500 mg Oral BID   Continuous Infusions:   LOS: 0 days    Time spent: 45 minutes spent on chart review, discussion with nursing staff, consultants, updating family and interview/physical exam; more than 50% of that time was spent in counseling and/or coordination of care.    Joseph Art, DO [facility] Available via Epic secure chat 7am-7pm After these hours, please refer to coverage provider listed on [email or web address] 07/07/2023, 1:16 PM

## 2023-07-07 NOTE — Progress Notes (Addendum)
STROKE TEAM PROGRESS NOTE   BRIEF HPI [name] is a 47 y.o. male with PMH significant for DM, hypertension, hyperlipidemia, former smoker, asthma who presented 12/2 with episode of right arm weakness, tremoring, right arm and right leg numbness.  Patient did drive himself to the ED.  He also reported repeating very anxious at this time. MRI showed acute/early subacute ischemia at left precentral and postcentral gyri junction.  C-spine MRI showed bilateral mild spinal canal and foraminal stenosis.  NIH on Admission: 2  INTERIM HISTORY/SUBJECTIVE  Wife at bedside.  Patient clearly states he presented with initial involuntary jerking of his right upper extremity subsequently followed by weakness and numbness Patient oriented and able to give clear history.  Still complaining of intermittent right hand numbness with a decreased right grip, good finger strength remains. Discussed stroke workup with patient and wife.  OBJECTIVE  CBC    Component Value Date/Time   WBC 6.2 07/07/2023 0640   RBC 5.46 07/07/2023 0640   HGB 13.7 07/07/2023 0640   HCT 42.4 07/07/2023 0640   PLT 192 07/07/2023 0640   MCV 77.7 (L) 07/07/2023 0640   MCV 77.8 (A) 04/02/2016 1423   MCH 25.1 (L) 07/07/2023 0640   MCHC 32.3 07/07/2023 0640   RDW 13.1 07/07/2023 0640   LYMPHSABS 2.3 07/06/2023 1522   MONOABS 0.4 07/06/2023 1522   EOSABS 0.1 07/06/2023 1522   BASOSABS 0.0 07/06/2023 1522    BMET    Component Value Date/Time   NA 138 07/06/2023 1535   NA 140 10/12/2020 1007   K 3.8 07/06/2023 1535   CL 98 07/06/2023 1535   CO2 26 07/06/2023 1522   GLUCOSE 197 (H) 07/06/2023 1535   BUN 11 07/06/2023 1535   BUN 13 10/12/2020 1007   CREATININE 0.93 07/07/2023 0041   CREATININE 0.97 04/02/2016 1410   CALCIUM 9.1 07/06/2023 1522   EGFR 98 10/12/2020 1007   GFRNONAA >60 07/07/2023 0041   GFRNONAA >89 04/02/2016 1410    IMAGING past 24 hours ECHOCARDIOGRAM COMPLETE BUBBLE STUDY  Result Date:  07/07/2023    ECHOCARDIOGRAM REPORT   Patient Name:   Brian Reid Date of Exam: 07/07/2023 Medical Rec #:  [id number]    Height:       70.0 in Accession #:    [id number]   Weight:       230.0 lb Date of Birth:  September 30, 1975     BSA:          2.215 m Patient Age:    47 years     BP:           123/88 mmHg Patient Gender: M            HR:           75 bpm. Exam Location:  Inpatient Procedure: 2D Echo, Cardiac Doppler, Color Doppler and Saline Contrast Bubble            Study Indications:    TIA 435.9/G45.9  History:        Patient has no prior history of Echocardiogram examinations.                 TIA.  Sonographer:    Lucendia Herrlich RCS Referring Phys: [phone] DEBBY CROSLEY IMPRESSIONS  1. Left ventricular ejection fraction, by estimation, is 60 to 65%. The left ventricle has normal function. The left ventricle has no regional wall motion abnormalities. Left ventricular diastolic parameters are indeterminate.  2. Right ventricular systolic function is  normal. The right ventricular size is normal.  3. The mitral valve is grossly normal. No evidence of mitral valve regurgitation.  4. The aortic valve is tricuspid. Aortic valve regurgitation is not visualized. No aortic stenosis is present.  5. The inferior vena cava is normal in size with greater than 50% respiratory variability, suggesting right atrial pressure of 3 mmHg.  6. Agitated saline contrast bubble study was negative, with no evidence of any interatrial shunt. Comparison(s): No prior Echocardiogram. FINDINGS  Left Ventricle: Left ventricular ejection fraction, by estimation, is 60 to 65%. The left ventricle has normal function. The left ventricle has no regional wall motion abnormalities. The left ventricular internal cavity size was normal in size. There is  no left ventricular hypertrophy. Left ventricular diastolic parameters are indeterminate. Right Ventricle: The right ventricular size is normal. No increase in right ventricular wall thickness. Right  ventricular systolic function is normal. Left Atrium: Left atrial size was normal in size. Right Atrium: Right atrial size was normal in size. Pericardium: Trivial pericardial effusion is present. The pericardial effusion is posterior to the left ventricle. Mitral Valve: The mitral valve is grossly normal. No evidence of mitral valve regurgitation. Tricuspid Valve: The tricuspid valve is normal in structure. Tricuspid valve regurgitation is not demonstrated. No evidence of tricuspid stenosis. Aortic Valve: The aortic valve is tricuspid. Aortic valve regurgitation is not visualized. No aortic stenosis is present. Aortic valve peak gradient measures 6.1 mmHg. Pulmonic Valve: The pulmonic valve was normal in structure. Pulmonic valve regurgitation is not visualized. No evidence of pulmonic stenosis. Aorta: The aortic root and ascending aorta are structurally normal, with no evidence of dilitation. Venous: The inferior vena cava is normal in size with greater than 50% respiratory variability, suggesting right atrial pressure of 3 mmHg. IAS/Shunts: No atrial level shunt detected by color flow Doppler. Agitated saline contrast was given intravenously to evaluate for intracardiac shunting. Agitated saline contrast bubble study was negative, with no evidence of any interatrial shunt.  LEFT VENTRICLE PLAX 2D LVIDd:         4.70 cm   Diastology LVIDs:         3.00 cm   LV e' medial:    6.85 cm/s LV PW:         0.90 cm   LV E/e' medial:  8.9 LV IVS:        0.80 cm   LV e' lateral:   8.49 cm/s LVOT diam:     2.00 cm   LV E/e' lateral: 7.2 LV SV:         58 LV SV Index:   26 LVOT Area:     3.14 cm  RIGHT VENTRICLE             IVC RV S prime:     15.70 cm/s  IVC diam: 1.70 cm TAPSE (M-mode): 1.7 cm LEFT ATRIUM             Index        RIGHT ATRIUM           Index LA diam:        3.70 cm 1.67 cm/m   RA Area:     14.20 cm LA Vol (A2C):   35.7 ml 16.12 ml/m  RA Volume:   31.40 ml  14.18 ml/m LA Vol (A4C):   34.0 ml 15.35 ml/m  LA Biplane Vol: 35.0 ml 15.80 ml/m  AORTIC VALVE AV Area (Vmax): 2.33 cm AV Vmax:  123.00 cm/s AV Peak Grad:   6.1 mmHg LVOT Vmax:      91.30 cm/s LVOT Vmean:     58.500 cm/s LVOT VTI:       0.186 m  AORTA Ao Root diam: 3.50 cm Ao Asc diam:  3.50 cm MITRAL VALVE               TRICUSPID VALVE MV Area (PHT): 3.37 cm    TR Peak grad:   5.1 mmHg MV Decel Time: 225 msec    TR Vmax:        113.00 cm/s MV E velocity: 61.10 cm/s MV A velocity: 79.10 cm/s  SHUNTS MV E/A ratio:  0.77        Systemic VTI:  0.19 m                            Systemic Diam: 2.00 cm Riley Lam MD Electronically signed by Riley Lam MD Signature Date/Time: 07/07/2023/12:40:37 PM    Final    EEG adult  Result Date: 07/07/2023 Charlsie Quest, MD     07/07/2023  8:48 AM Patient Name: Mervel Speyrer MRN: [id number] Epilepsy Attending: Charlsie Quest Referring Physician/Provider: Erick Blinks, MD Date: 07/07/2023 Duration: 24.15 mins Patient history: 47 y.o. male who presents with episode of right arm weakness associated with some tremoring along with right arm and right leg numbness and weakness. EEG to evaluate for seizure Level of alertness: Awake, asleep AEDs during EEG study: None Technical aspects: This EEG study was done with scalp electrodes positioned according to the 10-20 International system of electrode placement. Electrical activity was reviewed with band pass filter of 1-70Hz , sensitivity of 7 uV/mm, display speed of 32mm/sec with a 60Hz  notched filter applied as appropriate. EEG data were recorded continuously and digitally stored.  Video monitoring was available and reviewed as appropriate. Description: The posterior dominant rhythm consists of 9 Hz activity of moderate voltage (25-35 uV) seen predominantly in posterior head regions, symmetric and reactive to eye opening and eye closing. Sleep was characterized by vertex waves, sleep spindles (12 to 14 Hz), maximal frontocentral region.  Hyperventilation and photic stimulation were not performed.   IMPRESSION: This study is within normal limits. No seizures or epileptiform discharges were seen throughout the recording. A normal interictal EEG does not exclude the diagnosis of epilepsy. Charlsie Quest   MR BRAIN WO CONTRAST  Result Date: 07/07/2023 CLINICAL DATA:  Acute neurologic deficit. Right upper extremity tremor. EXAM: MRI HEAD WITHOUT CONTRAST MRI CERVICAL SPINE WITHOUT CONTRAST TECHNIQUE: Multiplanar, multiecho pulse sequences of the brain and surrounding structures, and cervical spine, to include the craniocervical junction and cervicothoracic junction, were obtained without intravenous contrast. Six sequences of the brain are provided. COMPARISON:  03/12/2019 FINDINGS: MRI HEAD FINDINGS Brain: There is a focus of acute/early subacute ischemia at the junction of the left precentral and postcentral gyri. No chronic microhemorrhage or siderosis. Normal white matter signal, parenchymal volume and CSF spaces. The midline structures are normal. Vascular: Normal flow voids. Skull and upper cervical spine: Normal marrow signal. Sinuses/Orbits: Negative. Other: None. MRI CERVICAL SPINE FINDINGS Alignment: Physiologic. Vertebrae: No fracture, evidence of discitis, or bone lesion. Cord: Normal signal and morphology. Posterior Fossa, vertebral arteries, paraspinal tissues: Negative. Disc levels: C1-2: Unremarkable. C2-3: Normal disc space and facet joints. There is no spinal canal stenosis. No neural foraminal stenosis. C3-4: Normal disc space and facet joints. There is no spinal canal stenosis. No neural foraminal  stenosis. C4-5: Small right subarticular disc protrusion with endplate osteophytes. Mild spinal canal stenosis. Mild right neural foraminal stenosis. C5-6: Unchanged intermediate sized right uncovertebral disc osteophyte complex. Mild spinal canal stenosis. Mild right neural foraminal stenosis. C6-7: Unchanged intermediate size right  subarticular/foraminal disc osteophyte complex with right foraminal disc extrusion with mild superior migration. Mild spinal canal stenosis. Moderate bilateral neural foraminal stenosis. C7-T1: Normal disc space and facet joints. There is no spinal canal stenosis. No neural foraminal stenosis. IMPRESSION: 1. Focus of acute/early subacute ischemia at the junction of the left precentral and postcentral gyri. 2. 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 3. Moderate bilateral C7 and mild right C5, C6 neural foraminal stenosis. Electronically Signed   By: Deatra Robinson M.D.   On: 07/07/2023 00:56   MR Cervical Spine Wo Contrast  Result Date: 07/07/2023 CLINICAL DATA:  Acute neurologic deficit. Right upper extremity tremor. EXAM: MRI HEAD WITHOUT CONTRAST MRI CERVICAL SPINE WITHOUT CONTRAST TECHNIQUE: Multiplanar, multiecho pulse sequences of the brain and surrounding structures, and cervical spine, to include the craniocervical junction and cervicothoracic junction, were obtained without intravenous contrast. Six sequences of the brain are provided. COMPARISON:  03/12/2019 FINDINGS: MRI HEAD FINDINGS Brain: There is a focus of acute/early subacute ischemia at the junction of the left precentral and postcentral gyri. No chronic microhemorrhage or siderosis. Normal white matter signal, parenchymal volume and CSF spaces. The midline structures are normal. Vascular: Normal flow voids. Skull and upper cervical spine: Normal marrow signal. Sinuses/Orbits: Negative. Other: None. MRI CERVICAL SPINE FINDINGS Alignment: Physiologic. Vertebrae: No fracture, evidence of discitis, or bone lesion. Cord: Normal signal and morphology. Posterior Fossa, vertebral arteries, paraspinal tissues: Negative. Disc levels: C1-2: Unremarkable. C2-3: Normal disc space and facet joints. There is no spinal canal stenosis. No neural foraminal stenosis. C3-4: Normal disc space and facet joints. There is no spinal canal stenosis.  No neural foraminal stenosis. C4-5: Small right subarticular disc protrusion with endplate osteophytes. Mild spinal canal stenosis. Mild right neural foraminal stenosis. C5-6: Unchanged intermediate sized right uncovertebral disc osteophyte complex. Mild spinal canal stenosis. Mild right neural foraminal stenosis. C6-7: Unchanged intermediate size right subarticular/foraminal disc osteophyte complex with right foraminal disc extrusion with mild superior migration. Mild spinal canal stenosis. Moderate bilateral neural foraminal stenosis. C7-T1: Normal disc space and facet joints. There is no spinal canal stenosis. No neural foraminal stenosis. IMPRESSION: 1. Focus of acute/early subacute ischemia at the junction of the left precentral and postcentral gyri. 2. 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 3. Moderate bilateral C7 and mild right C5, C6 neural foraminal stenosis. Electronically Signed   By: Deatra Robinson M.D.   On: 07/07/2023 00:56   DG Shoulder Right Portable  Result Date: 07/06/2023 CLINICAL DATA:  Right shoulder pain.  Prior surgical repair. EXAM: RIGHT SHOULDER - 1 VIEW COMPARISON:  Right shoulder radiograph dated 06/27/2020. FINDINGS: Fractured screw in the glenoid as seen previously. There is no acute fracture or dislocation. Mild arthritic changes of the glenohumeral joint. The soft tissues are unremarkable. IMPRESSION: 1. No acute fracture or dislocation. 2. Fractured screw in the glenoid. Electronically Signed   By: Elgie Collard M.D.   On: 07/06/2023 22:29   CT Angio Head Neck W WO CM  Result Date: 07/06/2023 CLINICAL DATA:  Numbness.  Acute neurologic deficit. EXAM: CT ANGIOGRAPHY HEAD AND NECK WITH AND WITHOUT CONTRAST TECHNIQUE: Multidetector CT imaging of the head and neck was performed using the standard protocol during bolus administration of  intravenous contrast. Multiplanar CT image reconstructions and MIPs were obtained to evaluate the vascular anatomy.  Carotid stenosis measurements (when applicable) are obtained utilizing NASCET criteria, using the distal internal carotid diameter as the denominator. RADIATION DOSE REDUCTION: This exam was performed according to the departmental dose-optimization program which includes automated exposure control, adjustment of the mA and/or kV according to patient size and/or use of iterative reconstruction technique. CONTRAST:  75mL OMNIPAQUE IOHEXOL 350 MG/ML SOLN COMPARISON:  None Available. FINDINGS: CTA NECK FINDINGS SKELETON: No acute abnormality or high grade bony spinal canal stenosis. OTHER NECK: Normal pharynx, larynx and major salivary glands. No cervical lymphadenopathy. Unremarkable thyroid gland. UPPER CHEST: No pneumothorax or pleural effusion. No nodules or masses. AORTIC ARCH: There is no calcific atherosclerosis of the aortic arch. Conventional 3 vessel aortic branching pattern. RIGHT CAROTID SYSTEM: Normal without aneurysm, dissection or stenosis. LEFT CAROTID SYSTEM: Normal without aneurysm, dissection or stenosis. VERTEBRAL ARTERIES: Left dominant configuration. There is no dissection, occlusion or flow-limiting stenosis to the skull base (V1-V3 segments). CTA HEAD FINDINGS POSTERIOR CIRCULATION: --Vertebral arteries: Normal V4 segments. --Inferior cerebellar arteries: Normal. --Basilar artery: Normal. --Superior cerebellar arteries: Normal. --Posterior cerebral arteries (PCA): Normal. ANTERIOR CIRCULATION: --Intracranial internal carotid arteries: Normal. --Anterior cerebral arteries (ACA): Normal. --Middle cerebral arteries (MCA): Normal. VENOUS SINUSES: As permitted by contrast timing, patent. ANATOMIC VARIANTS: None Review of the MIP images confirms the above findings. IMPRESSION: No emergent large vessel occlusion or high-grade stenosis of the cerebrovascular arteries. Electronically Signed   By: Deatra Robinson M.D.   On: 07/06/2023 20:49   CT HEAD WO CONTRAST  Result Date: 07/06/2023 CLINICAL DATA:   Neuro deficit, persistent or recurrent, CNS neoplasm suspected. Numbness. EXAM: CT HEAD WITHOUT CONTRAST TECHNIQUE: Contiguous axial images were obtained from the base of the skull through the vertex without intravenous contrast. RADIATION DOSE REDUCTION: This exam was performed according to the departmental dose-optimization program which includes automated exposure control, adjustment of the mA and/or kV according to patient size and/or use of iterative reconstruction technique. COMPARISON:  02/26/2016 FINDINGS: Brain: No evidence of acute infarction, hemorrhage, hydrocephalus, extra-axial collection or mass lesion/mass effect. Vascular: No hyperdense vessel or unexpected calcification. Skull: Normal. Negative for fracture or focal lesion. Calcification between the left occipital condyle and dense, possibly old avulsion injury. Sinuses/Orbits: No acute finding. IMPRESSION: Normal CT of the brain. Electronically Signed   By: Tiburcio Pea M.D.   On: 07/06/2023 18:52    Vitals:   07/07/23 1000 07/07/23 1200 07/07/23 1332 07/07/23 1457  BP: (!) 135/100  (!) 153/103 (!) 136/94  Pulse: 67  71 69  Resp: 19  18 19   Temp:  98.2 F (36.8 C)  98.4 F (36.9 C)  TempSrc:  Oral  Oral  SpO2: 93%  98% 98%  Weight:      Height:        PHYSICAL EXAM General:  Alert, well-nourished, well-developed [place] origin male in no acute distress Psych:  Mood and affect appropriate for situation CV: Regular rate and rhythm on monitor Respiratory:  Regular, unlabored respirations on room air GI: Abdomen soft and nontender  NEURO:  Mental Status: AA&Ox3, patient is able to give clear and coherent history Speech/Language: speech is without dysarthria or aphasia.  Naming, repetition, fluency, and comprehension intact.  Cranial Nerves:  II: PERRL. Visual fields full.  III, IV, VI: EOMI. Eyelids elevate symmetrically.  V: Sensation is intact to light touch and symmetrical to face.  VII: Face is  symmetrical resting and smiling  VIII: hearing intact to voice. IX, X: Palate elevates symmetrically. Phonation is normal.  WU:[id number] shrug 5/5. XII: tongue is midline without fasciculations. Motor: 5/5 strength to major muscle groups.  Decreased right grip, but good finger strength.  Diminished fine finger movements on the right.  Orbits left or right upper extremity Tone: is normal and bulk is normal Sensation-intermittent numbness noted to right hand Coordination: FTN intact bilaterally, HKS: no ataxia in BLE.No drift.  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  MRI   Focus of acute/early subacute ischemia at the junction of the left precentral and postcentral gyri. 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  Moderate bilateral C7 and mild right C5, C6 neural foraminal stenosis.  VAS [place] LE DVT: PENDING VAS [place] TCD w/ Bubble Study: PENDING 2D Echo: LVEF EF 60 to 65%, no shunt. May need 30-day monitoring or loop recorder on discharge  LDL 109 HgbA1c 7.6 VTE prophylaxis -Lovenox No antithrombotic prior to admission, now on aspirin 81 mg daily and clopidogrel 75 mg daily for 3 weeks and then aspirin alone. Therapy recommendations:  Pending Disposition:  pending  Hypertension Home meds:  none Stable Blood Pressure Goal: BP less than 220/110 for first 24 to 48 hours and gradually normalize  Hyperlipidemia Home meds:  none LDL 109, goal < 70 Add Lipitor 40 mg Continue statin at discharge  Diabetes type II Uncontrolled Home meds:  Metformin 500 mg twice  daily HgbA1c 7.6, goal < 7.0 CBGs SSI Recommend close follow-up with PCP for better DM control  Tobacco Abuse Former cigarette smoker  Other Stroke Risk Factors ETOH use, advised to drink no more than 1-2 drink(s) a day Obesity, Body mass index is 33 kg/m., BMI >/= 30 associated with increased stroke risk, recommend weight loss, diet and exercise as appropriate  Substance use? : UDS pending Family hx stroke (mother)  Hospital day # 0  Pt seen by Neuro NP/APP and later by MD. Note/plan to be edited by MD as needed.    Lynnae January, DNP, AGACNP-BC [facility] Please use AMION for contact information & EPIC for messaging.  STROKE MD NOTE : I have personally obtained history,examined this patient, reviewed notes, independently viewed imaging studies, participated in medical decision making and plan of care.ROS completed by me personally and pertinent positives fully documented  I have made any additions or clarifications directly to the above note. Agree with note above.  Patient presented with involuntary jerking of the right upper extremity followed by weakness and numbness MRI scan shows embolic left precentral gyrus infarct.  His deficits have mostly resolved with only mild diminished right and fine motor skills and some paresthesias remaining.  CT angiogram was unremarkable.  Strong suspicion for cardioembolic source.  Continue cardiac monitoring, check TCD bubble study for PFO and TEE.  Check ANA and anticardiolipin antibodies.  Patient may also be at risk for obstructive sleep apnea and may benefit with consideration for sleep study to look for obstructive sleep apnea as an outpatient.  Recommend aspirin and Plavix for 3 weeks followed by aspirin alone and aggressive risk factor moderate occasion.  Patient also offered possibility of participation  in the [place] stroke study but after reviewing information he had an his wife declined perspective.  Greater than 50% time  during this 50-minute visit was spent in counseling and coordination of care about his embolic stroke and discussion about evaluation and treatment and answered  Delia Heady, MD Medical Director [facility] Pager: [phone] 07/07/2023 7:53 PM   To contact Stroke Continuity provider, please refer to [email or web address]. After hours, contact General Neurology

## 2023-07-07 NOTE — Progress Notes (Signed)
Echocardiogram 2D Echocardiogram has been performed.  Lucendia Herrlich 07/07/2023, 12:06 PM

## 2023-07-07 NOTE — Evaluation (Signed)
Occupational Therapy Evaluation Patient Details Name: Brian Reid MRN: [id number] DOB: 07-Apr-1976 Today's Date: 07/07/2023   History of Present Illness 47 y.o. male with a past medical history of diabetes, hypertension, hyperlipidemia, asthma, former smoker who presents with episode of right arm weakness associated with some tremoring along with right arm and right leg numbness.  MR Brain:acute/early subacute ischemia at the junction of the  left precentral and postcentral gyri.   Clinical Impression   Patient admitted for the diagnosis above.  PTA he lives at home with his spouse, and remains independent with all ADL,iADL and mobility.  Currently his only concern is sensation and strength deficits to his R leg.  He does not quite trust his R leg yet, and mild unsteadiness, but no LOB or knee buckle noted.  OT will continue efforts in the acute setting to address any deficits, but no post acute OT is anticipated.          If plan is discharge home, recommend the following: Assist for transportation    Functional Status Assessment  Patient has had a recent decline in their functional status and demonstrates the ability to make significant improvements in function in a reasonable and predictable amount of time.  Equipment Recommendations  None recommended by OT    Recommendations for Other Services       Precautions / Restrictions Precautions Precautions: Fall Restrictions Weight Bearing Restrictions: No      Mobility Bed Mobility Overal bed mobility: Independent                  Transfers Overall transfer level: Needs assistance   Transfers: Sit to/from Stand, Bed to chair/wheelchair/BSC Sit to Stand: Independent     Step pivot transfers: Supervision            Balance Overall balance assessment: Mild deficits observed, not formally tested                                         ADL either performed or assessed with clinical judgement    ADL       Grooming: Wash/dry hands;Independent;Standing               Lower Body Dressing: Supervision/safety;Sit to/from stand   Toilet Transfer: Retail banker;Ambulation                   Vision Patient Visual Report: No change from baseline       Perception Perception: Within Functional Limits       Praxis Praxis: WFL       Pertinent Vitals/Pain Pain Assessment Pain Assessment: No/denies pain Pain Intervention(s): Monitored during session     Extremity/Trunk Assessment Upper Extremity Assessment Upper Extremity Assessment: Overall WFL for tasks assessed;RUE deficits/detail RUE Deficits / Details: decreased AROM due to prior injurt/dislocation RUE Sensation: decreased light touch RUE Coordination: WNL   Lower Extremity Assessment Lower Extremity Assessment: Defer to PT evaluation   Cervical / Trunk Assessment Cervical / Trunk Assessment: Normal   Communication Communication Communication: No apparent difficulties   Cognition Arousal: Alert Behavior During Therapy: WFL for tasks assessed/performed Overall Cognitive Status: Within Functional Limits for tasks assessed                                       General Comments  Patient feeels as though his R LE remains weak.    Exercises     Shoulder Instructions      Home Living Family/patient expects to be discharged to:: Private residence Living Arrangements: Spouse/significant other Available Help at Discharge: Family;Available 24 hours/day Type of Home: House Home Access: Stairs to enter [employer] of Steps: 4   Home Layout: Two level Alternate Level Stairs-Number of Steps: 12   Bathroom Shower/Tub: Tub/shower unit;Walk-in shower   Bathroom Toilet: Standard Bathroom Accessibility: Yes How Accessible: Accessible via walker Home Equipment: None          Prior Functioning/Environment Prior Level of Function : Independent/Modified  Independent;Driving;Working/employed                        OT Problem List: Impaired balance (sitting and/or standing);Decreased strength      OT Treatment/Interventions: Self-care/ADL training;Therapeutic activities;Balance training    OT Goals(Current goals can be found in the care plan section) Acute Rehab OT Goals Patient Stated Goal: Return home OT Goal Formulation: With patient Time For Goal Achievement: 07/21/23 Potential to Achieve Goals: Good ADL Goals Pt Will Perform Lower Body Dressing: Independently;sit to/from stand Pt Will Transfer to Toilet: Independently;ambulating;regular height toilet  OT Frequency: Min 1X/week    Co-evaluation              AM-PAC OT "6 Clicks" Daily Activity     Outcome Measure Help from another person eating meals?: None Help from another person taking care of personal grooming?: None Help from another person toileting, which includes using toliet, bedpan, or urinal?: A Little Help from another person bathing (including washing, rinsing, drying)?: A Little Help from another person to put on and taking off regular upper body clothing?: None Help from another person to put on and taking off regular lower body clothing?: A Little 6 Click Score: 21   End of Session Nurse Communication: Mobility status  Activity Tolerance: Patient tolerated treatment well Patient left: in bed;with call bell/phone within reach  OT Visit Diagnosis: Unsteadiness on feet (R26.81)                Time: 6213-0865 OT Time Calculation (min): 18 min Charges:  OT General Charges $OT Visit: 1 Visit OT Evaluation $OT Eval Moderate Complexity: 1 Mod  07/07/2023  RP, OTR/L  Acute Rehabilitation Services  Office:  [phone]   Suzanna Obey 07/07/2023, 11:19 AM

## 2023-07-07 NOTE — Progress Notes (Signed)
EEG complete - results pending 

## 2023-07-07 NOTE — Evaluation (Signed)
Physical Therapy Evaluation Patient Details Name: Brian Reid MRN: [id number] DOB: 07/26/1976 Today's Date: 07/07/2023  History of Present Illness  47 y.o. male with a past medical history of diabetes, hypertension, hyperlipidemia, asthma, former smoker who presents with episode of right arm weakness associated with some tremoring along with right arm and right leg numbness.  MR Brain:acute/early subacute ischemia at the junction of the  left precentral and postcentral gyri.  Clinical Impression  Patient presents with decreased strength and sensation R LE and note some gait changes with scuffing floor occasionally with R foot and note, per patient, with prior postural abnormalities with neck and shoulder pain and R hip/groin pain.  He was able to demonstrate mobility appropriate for home including Dynamic Gait Index score of 21/24.  He will benefit from follow up outpatient PT for progressing postural corrections, addressing neck pain and working on R LE weakness.  Patient appropriate for home and no further acute level skilled PT needs.     BP post ambulation 177/100, HR 70's, SpO2 99%.     If plan is discharge home, recommend the following: Assist for transportation   Can travel by private vehicle        Equipment Recommendations None recommended by PT  Recommendations for Other Services       Functional Status Assessment Patient has had a recent decline in their functional status and demonstrates the ability to make significant improvements in function in a reasonable and predictable amount of time.     Precautions / Restrictions Precautions Precautions: None Restrictions Weight Bearing Restrictions: No      Mobility  Bed Mobility               General bed mobility comments: in chair in room    Transfers   Equipment used: None Transfers: Sit to/from Stand Sit to Stand: Independent           General transfer comment: no assist needed     Ambulation/Gait Ambulation/Gait assistance: Supervision Gait Distance (Feet): 400 Feet Assistive device: None Gait Pattern/deviations: Step-through pattern, Shuffle, Wide base of support       General Gait Details: R hip external rotation and occasional scuffing floor with R foot, no LOB or balance issues noted  Stairs Stairs: Yes   Stair Management: One rail Right, Alternating pattern, Forwards Number of Stairs: 8 (x 2) General stair comments: step through pattern, no issues using rail appropriately  Wheelchair Mobility     Tilt Bed    Modified Rankin (Stroke Patients Only) Modified Rankin (Stroke Patients Only) Pre-Morbid Rankin Score: No significant disability Modified Rankin: Slight disability     Balance Overall balance assessment: Modified Independent                               Standardized Balance Assessment Standardized Balance Assessment : Dynamic Gait Index   Dynamic Gait Index Level Surface: Mild Impairment Change in Gait Speed: Normal Gait with Horizontal Head Turns: Normal Gait with Vertical Head Turns: Mild Impairment Gait and Pivot Turn: Normal Step Over Obstacle: Normal Step Around Obstacles: Normal Steps: Mild Impairment Total Score: 21       Pertinent Vitals/Pain Pain Assessment Pain Score: 7  Pain Location: neck and shoulders Pain Descriptors / Indicators: Aching, Discomfort Pain Intervention(s): Monitored during session, Repositioned    Home Living Family/patient expects to be discharged to:: Private residence Living Arrangements: Spouse/significant other Available Help at Discharge: Family;Available 24 hours/day Type  of Home: House Home Access: Stairs to enter Entrance Stairs-Rails: Doctor, general practice of Steps: 4 Alternate Level Stairs-Number of Steps: 12 Home Layout: Two level Home Equipment: None;Grab bars - tub/shower      Prior Function Prior Level of Function : Independent/Modified  Independent;Driving;Working/employed             Mobility Comments: 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 [employer]       Extremity/Trunk Assessment   Upper Extremity Assessment Upper Extremity Assessment: Defer to OT evaluation RUE Deficits / Details: decreased AROM due to prior injurt/dislocation RUE Sensation: decreased light touch RUE Coordination: WNL    Lower Extremity Assessment Lower Extremity Assessment: RLE deficits/detail RLE Deficits / Details: AROM WFL, strength grossly 4-/5 throughout, noted decreased sensation to light touch compared to L LE    Cervical / Trunk Assessment Cervical / Trunk Assessment: Other exceptions Cervical / Trunk Exceptions: rounded shoulders, forward head  Communication   Communication Communication: No apparent difficulties  Cognition Arousal: Alert Behavior During Therapy: WFL for tasks assessed/performed Overall Cognitive Status: Within Functional Limits for tasks assessed                                          General Comments General comments (skin integrity, edema, etc.): Reports prior history of R LE issues with hip tightness, had twisted over the leg with some groin pull, etc.    Exercises     Assessment/Plan    PT Assessment All further PT needs can be met in the next venue of care  PT Problem List Decreased strength;Impaired sensation       PT Treatment Interventions      PT Goals (Current goals can be found in the Care Plan section)  Acute Rehab PT Goals PT Goal Formulation: All assessment and education complete, DC therapy    Frequency       Co-evaluation               AM-PAC PT "6 Clicks" Mobility  Outcome Measure Help needed turning from your back to your side while in a flat bed without using bedrails?: None Help needed moving from lying on your back to sitting on the side of a flat bed without using bedrails?: None Help needed moving to and from a bed to a chair (including a wheelchair)?: None Help  needed standing up from a chair using your arms (e.g., wheelchair or bedside chair)?: None Help needed to walk in hospital room?: None Help needed climbing 3-5 steps with a railing? : None 6 Click Score: 24    End of Session Equipment Utilized During Treatment: Gait belt Activity Tolerance: Patient tolerated treatment well Patient left: in chair;with call bell/phone within reach;with family/visitor present   PT Visit Diagnosis: Muscle weakness (generalized) (M62.81);Other symptoms and signs involving the nervous system (R29.898)    Time: 1207-1228 PT Time Calculation (min) (ACUTE ONLY): 21 min   Charges:   PT Evaluation $PT Eval Moderate Complexity: 1 Mod   PT General Charges $$ ACUTE PT VISIT: 1 Visit         Sheran Lawless, PT Acute Rehabilitation Services Office:[phone] 07/07/2023   Elray Mcgregor 07/07/2023, 1:30 PM

## 2023-07-07 NOTE — ED Notes (Signed)
ED TO INPATIENT HANDOFF REPORT  ED Nurse Name and Phone #: Gustavo Lah, rn [phone]   S Name/Age/Gender Brian Reid 47 y.o. male Room/Bed: 038C/038C  Code Status   Code Status: Full Code  Home/SNF/Other Home Patient oriented to: self, place, time, and situation Is this baseline? Yes   Triage Complete: Triage complete  Chief Complaint TIA (transient ischemic attack) [G45.9]  Triage Note Pt to ED c/o right side numbness that started aprox 1 hour ago. Reports right hand and arm became numb/tinglingfirst, and then right leg. Pt has full ROM. No other neuro deficits noted. On touch pt reports same sensation on right and left side. Pt anxious in triage    Allergies No Known Allergies  Level of Care/Admitting Diagnosis ED Disposition     ED Disposition  Admit   Condition  --   Comment  Hospital Area: [facility] [[id number]]  Level of Care: Telemetry Medical [104]  May place patient in observation at [facility] or [facility] if equivalent level of care is available:: No  Covid Evaluation: Asymptomatic - no recent exposure (last 10 days) testing not required  Diagnosis: TIA (transient ischemic attack) [643329]  Admitting Physician: Gery Pray [[id number]]  Attending Physician: Alvester Chou          B Medical/Surgery History Past Medical History:  Diagnosis Date   Asthma    Past Surgical History:  Procedure Laterality Date   APPENDECTOMY N/A    [employer]ia 06/27/2020   LAPAROSCOPIC APPENDECTOMY N/A 04/02/2016   Procedure: APPENDECTOMY LAPAROSCOPIC;  Surgeon: Chevis Pretty III, MD;  Location: [facility];  Service: General;  Laterality: N/A;   LEG SURGERY     age 23   OPEN REDUCTION SHOULDER DISLOCATION     age 89     A IV Location/Drains/Wounds Patient Lines/Drains/Airways Status     Active Line/Drains/Airways     Name Placement date Placement time Site Days   Peripheral IV 07/06/23 20 G 1" Left Antecubital 07/06/23  1615  Antecubital  1             Intake/Output Last 24 hours No intake or output data in the 24 hours ending 07/07/23 1326  Labs/Imaging Results for orders placed or performed during the hospital encounter of 07/06/23 (from the past 48 hour(s))  CBG monitoring, ED     Status: Abnormal   Collection Time: 07/06/23  3:10 PM  Result Value Ref Range   Glucose-Capillary 146 (H) 70 - 99 mg/dL    Comment: Glucose reference range applies only to samples taken after fasting for at least 8 hours.   Comment 1 Notify RN    Comment 2 Document in Chart   Urinalysis, Routine w reflex microscopic -Urine, Clean Catch     Status: None   Collection Time: 07/06/23  3:15 PM  Result Value Ref Range   Color, Urine YELLOW YELLOW   APPearance CLEAR CLEAR   Specific Gravity, Urine 1.010 1.005 - 1.030   pH 6.0 5.0 - 8.0   Glucose, UA NEGATIVE NEGATIVE mg/dL   Hgb urine dipstick NEGATIVE NEGATIVE   Bilirubin Urine NEGATIVE NEGATIVE   Ketones, ur NEGATIVE NEGATIVE mg/dL   Protein, ur NEGATIVE NEGATIVE mg/dL   Nitrite NEGATIVE NEGATIVE   Leukocytes,Ua NEGATIVE NEGATIVE    Comment: Performed at [facility], [address], [place], [place] [address]  Protime-INR     Status: None   Collection Time: 07/06/23  3:22 PM  Result Value Ref Range   Prothrombin  Time 13.7 11.4 - 15.2 seconds   INR 1.0 0.8 - 1.2    Comment: (NOTE) INR goal varies based on device and disease states. Performed at [facility], [address], [place], [place] [address]   APTT     Status: None   Collection Time: 07/06/23  3:22 PM  Result Value Ref Range   aPTT 26 24 - 36 seconds    Comment: Performed at [facility], [address], [place], [place] [address]  CBC     Status: Abnormal   Collection Time: 07/06/23  3:22 PM  Result Value Ref Range   WBC 6.3 4.0 - 10.5 K/uL   RBC 5.60 4.22 - 5.81 MIL/uL   Hemoglobin 14.7 13.0 - 17.0 g/dL   HCT 69.6 29.5 - 28.4 %   MCV 78.6 (L) 80.0 - 100.0 fL   MCH 26.3 26.0 - 34.0 pg   MCHC 33.4 30.0  - 36.0 g/dL   RDW 13.2 44.0 - 10.2 %   Platelets 214 150 - 400 K/uL   nRBC 0.0 0.0 - 0.2 %    Comment: Performed at [facility], [address], [place], [place] [address]  Differential     Status: None   Collection Time: 07/06/23  3:22 PM  Result Value Ref Range   Neutrophils Relative % 54 %   Neutro Abs 3.5 1.7 - 7.7 K/uL   Lymphocytes Relative 36 %   Lymphs Abs 2.3 0.7 - 4.0 K/uL   Monocytes Relative 7 %   Monocytes Absolute 0.4 0.1 - 1.0 K/uL   Eosinophils Relative 2 %   Eosinophils Absolute 0.1 0.0 - 0.5 K/uL   Basophils Relative 1 %   Basophils Absolute 0.0 0.0 - 0.1 K/uL   Immature Granulocytes 0 %   Abs Immature Granulocytes 0.02 0.00 - 0.07 K/uL    Comment: Performed at [facility], [address], [place], [place] [address]  Comprehensive metabolic panel     Status: Abnormal   Collection Time: 07/06/23  3:22 PM  Result Value Ref Range   Sodium 135 135 - 145 mmol/L   Potassium 3.8 3.5 - 5.1 mmol/L   Chloride 100 98 - 111 mmol/L   CO2 26 22 - 32 mmol/L   Glucose, Bld 197 (H) 70 - 99 mg/dL    Comment: Glucose reference range applies only to samples taken after fasting for at least 8 hours.   BUN 11 6 - 20 mg/dL   Creatinine, Ser 4.74 0.61 - 1.24 mg/dL   Calcium 9.1 8.9 - 25.9 mg/dL   Total Protein 6.7 6.5 - 8.1 g/dL   Albumin 3.9 3.5 - 5.0 g/dL   AST 27 15 - 41 U/L   ALT 44 0 - 44 U/L   Alkaline Phosphatase 46 38 - 126 U/L   Total Bilirubin 0.9 <1.2 mg/dL   GFR, Estimated >56 >38 mL/min    Comment: (NOTE) Calculated using the CKD-EPI Creatinine Equation (2021)    Anion gap 9 5 - 15    Comment: Performed at [facility], [address], [place], [place] [address]  Ethanol     Status: None   Collection Time: 07/06/23  3:22 PM  Result Value Ref Range   Alcohol, Ethyl (B) <10 <10 mg/dL    Comment: (NOTE) Lowest detectable limit for serum alcohol is 10 mg/dL.  For medical purposes only. Performed at [facility], [address],  [place],  [place] [address]   I-stat chem 8, ED     Status: Abnormal   Collection Time: 07/06/23  3:35 PM  Result Value Ref Range   Sodium 138 135 - 145 mmol/L   Potassium 3.8 3.5 - 5.1 mmol/L   Chloride 98 98 - 111 mmol/L   BUN 11 6 - 20 mg/dL   Creatinine, Ser 6.04 0.61 - 1.24 mg/dL   Glucose, Bld 540 (H) 70 - 99 mg/dL    Comment: Glucose reference range applies only to samples taken after fasting for at least 8 hours.   Calcium, Ion 1.14 (L) 1.15 - 1.40 mmol/L   TCO2 26 22 - 32 mmol/L   Hemoglobin 15.0 13.0 - 17.0 g/dL   HCT 98.1 19.1 - 47.8 %  CBC     Status: Abnormal   Collection Time: 07/07/23 12:41 AM  Result Value Ref Range   WBC 7.4 4.0 - 10.5 K/uL   RBC 5.21 4.22 - 5.81 MIL/uL   Hemoglobin 13.3 13.0 - 17.0 g/dL   HCT 29.5 62.1 - 30.8 %   MCV 77.4 (L) 80.0 - 100.0 fL   MCH 25.5 (L) 26.0 - 34.0 pg   MCHC 33.0 30.0 - 36.0 g/dL   RDW 65.7 84.6 - 96.2 %   Platelets 187 150 - 400 K/uL   nRBC 0.0 0.0 - 0.2 %    Comment: Performed at [facility], [address], [place], [place] [address]  Creatinine, serum     Status: None   Collection Time: 07/07/23 12:41 AM  Result Value Ref Range   Creatinine, Ser 0.93 0.61 - 1.24 mg/dL   GFR, Estimated >13 >24 mL/min    Comment: (NOTE) Calculated using the CKD-EPI Creatinine Equation (2021) Performed at [facility], [address], [place], [place] [address]   CBG monitoring, ED     Status: Abnormal   Collection Time: 07/07/23 12:49 AM  Result Value Ref Range   Glucose-Capillary 200 (H) 70 - 99 mg/dL    Comment: Glucose reference range applies only to samples taken after fasting for at least 8 hours.  Lipid panel     Status: Abnormal   Collection Time: 07/07/23  6:40 AM  Result Value Ref Range   Cholesterol 206 (H) 0 - 200 mg/dL   Triglycerides 725 (H) <150 mg/dL   HDL 30 (L) >36 mg/dL   Total CHOL/HDL Ratio 6.9 RATIO   VLDL 67 (H) 0 - 40 mg/dL   LDL Cholesterol 644 (H) 0 - 99 mg/dL    Comment:        Total  Cholesterol/HDL:CHD Risk Coronary Heart Disease Risk Table                     Men   Women  1/2 Average Risk   3.4   3.3  Average Risk       5.0   4.4  2 X Average Risk   9.6   7.1  3 X Average Risk  23.4   11.0        Use the calculated Patient Ratio above and the CHD Risk Table to determine the patient's CHD Risk.        ATP III CLASSIFICATION (LDL):  <100     mg/dL   Optimal  034-742  mg/dL   Near or Above                    Optimal  130-159  mg/dL  Borderline  160-189  mg/dL   High  >914     mg/dL   Very High Performed at [facility], [address], [place], [place] [address]   CBC     Status: Abnormal   Collection Time: 07/07/23  6:40 AM  Result Value Ref Range   WBC 6.2 4.0 - 10.5 K/uL   RBC 5.46 4.22 - 5.81 MIL/uL   Hemoglobin 13.7 13.0 - 17.0 g/dL   HCT 62.1 30.8 - 65.7 %   MCV 77.7 (L) 80.0 - 100.0 fL   MCH 25.1 (L) 26.0 - 34.0 pg   MCHC 32.3 30.0 - 36.0 g/dL   RDW 84.6 96.2 - 95.2 %   Platelets 192 150 - 400 K/uL   nRBC 0.0 0.0 - 0.2 %    Comment: Performed at [facility], [address], [place], [place] [address]  CBG monitoring, ED     Status: Abnormal   Collection Time: 07/07/23  9:40 AM  Result Value Ref Range   Glucose-Capillary 148 (H) 70 - 99 mg/dL    Comment: Glucose reference range applies only to samples taken after fasting for at least 8 hours.  CBG monitoring, ED     Status: Abnormal   Collection Time: 07/07/23  1:23 PM  Result Value Ref Range   Glucose-Capillary 143 (H) 70 - 99 mg/dL    Comment: Glucose reference range applies only to samples taken after fasting for at least 8 hours.   ECHOCARDIOGRAM COMPLETE BUBBLE STUDY  Result Date: 07/07/2023    ECHOCARDIOGRAM REPORT   Patient Name:   Brian Reid Date of Exam: 07/07/2023 Medical Rec #:  [id number]    Height:       70.0 in Accession #:    [id number]   Weight:       230.0 lb Date of Birth:  11-07-1975     BSA:          2.215 m Patient Age:    47 years     BP:           123/88  mmHg Patient Gender: M            HR:           75 bpm. Exam Location:  Inpatient Procedure: 2D Echo, Cardiac Doppler, Color Doppler and Saline Contrast Bubble            Study Indications:    TIA 435.9/G45.9  History:        Patient has no prior history of Echocardiogram examinations.                 TIA.  Sonographer:    Lucendia Herrlich RCS Referring Phys: [id number] DEBBY CROSLEY IMPRESSIONS  1. Left ventricular ejection fraction, by estimation, is 60 to 65%. The left ventricle has normal function. The left ventricle has no regional wall motion abnormalities. Left ventricular diastolic parameters are indeterminate.  2. Right ventricular systolic function is normal. The right ventricular size is normal.  3. The mitral valve is grossly normal. No evidence of mitral valve regurgitation.  4. The aortic valve is tricuspid. Aortic valve regurgitation is not visualized. No aortic stenosis is present.  5. The inferior vena cava is normal in size with greater than 50% respiratory variability, suggesting right atrial pressure of 3 mmHg.  6. Agitated saline contrast bubble study was negative, with no evidence of any interatrial shunt. Comparison(s): No prior Echocardiogram. FINDINGS  Left Ventricle: Left ventricular ejection fraction,  by estimation, is 60 to 65%. The left ventricle has normal function. The left ventricle has no regional wall motion abnormalities. The left ventricular internal cavity size was normal in size. There is  no left ventricular hypertrophy. Left ventricular diastolic parameters are indeterminate. Right Ventricle: The right ventricular size is normal. No increase in right ventricular wall thickness. Right ventricular systolic function is normal. Left Atrium: Left atrial size was normal in size. Right Atrium: Right atrial size was normal in size. Pericardium: Trivial pericardial effusion is present. The pericardial effusion is posterior to the left ventricle. Mitral Valve: The mitral valve is grossly  normal. No evidence of mitral valve regurgitation. Tricuspid Valve: The tricuspid valve is normal in structure. Tricuspid valve regurgitation is not demonstrated. No evidence of tricuspid stenosis. Aortic Valve: The aortic valve is tricuspid. Aortic valve regurgitation is not visualized. No aortic stenosis is present. Aortic valve peak gradient measures 6.1 mmHg. Pulmonic Valve: The pulmonic valve was normal in structure. Pulmonic valve regurgitation is not visualized. No evidence of pulmonic stenosis. Aorta: The aortic root and ascending aorta are structurally normal, with no evidence of dilitation. Venous: The inferior vena cava is normal in size with greater than 50% respiratory variability, suggesting right atrial pressure of 3 mmHg. IAS/Shunts: No atrial level shunt detected by color flow Doppler. Agitated saline contrast was given intravenously to evaluate for intracardiac shunting. Agitated saline contrast bubble study was negative, with no evidence of any interatrial shunt.  LEFT VENTRICLE PLAX 2D LVIDd:         4.70 cm   Diastology LVIDs:         3.00 cm   LV e' medial:    6.85 cm/s LV PW:         0.90 cm   LV E/e' medial:  8.9 LV IVS:        0.80 cm   LV e' lateral:   8.49 cm/s LVOT diam:     2.00 cm   LV E/e' lateral: 7.2 LV SV:         58 LV SV Index:   26 LVOT Area:     3.14 cm  RIGHT VENTRICLE             IVC RV S prime:     15.70 cm/s  IVC diam: 1.70 cm TAPSE (M-mode): 1.7 cm LEFT ATRIUM             Index        RIGHT ATRIUM           Index LA diam:        3.70 cm 1.67 cm/m   RA Area:     14.20 cm LA Vol (A2C):   35.7 ml 16.12 ml/m  RA Volume:   31.40 ml  14.18 ml/m LA Vol (A4C):   34.0 ml 15.35 ml/m LA Biplane Vol: 35.0 ml 15.80 ml/m  AORTIC VALVE AV Area (Vmax): 2.33 cm AV Vmax:        123.00 cm/s AV Peak Grad:   6.1 mmHg LVOT Vmax:      91.30 cm/s LVOT Vmean:     58.500 cm/s LVOT VTI:       0.186 m  AORTA Ao Root diam: 3.50 cm Ao Asc diam:  3.50 cm MITRAL VALVE               TRICUSPID VALVE  MV Area (PHT): 3.37 cm    TR Peak grad:   5.1 mmHg MV Decel Time: 225 msec  TR Vmax:        113.00 cm/s MV E velocity: 61.10 cm/s MV A velocity: 79.10 cm/s  SHUNTS MV E/A ratio:  0.77        Systemic VTI:  0.19 m                            Systemic Diam: 2.00 cm Riley Lam MD Electronically signed by Riley Lam MD Signature Date/Time: 07/07/2023/12:40:37 PM    Final    EEG adult  Result Date: 07/07/2023 Charlsie Quest, MD     07/07/2023  8:48 AM Patient Name: Sarah Purk MRN: [id number] Epilepsy Attending: Charlsie Quest Referring Physician/Provider: Erick Blinks, MD Date: 07/07/2023 Duration: 24.15 mins Patient history: 47 y.o. male who presents with episode of right arm weakness associated with some tremoring along with right arm and right leg numbness and weakness. EEG to evaluate for seizure Level of alertness: Awake, asleep AEDs during EEG study: None Technical aspects: This EEG study was done with scalp electrodes positioned according to the 10-20 International system of electrode placement. Electrical activity was reviewed with band pass filter of 1-70Hz , sensitivity of 7 uV/mm, display speed of 28mm/sec with a 60Hz  notched filter applied as appropriate. EEG data were recorded continuously and digitally stored.  Video monitoring was available and reviewed as appropriate. Description: The posterior dominant rhythm consists of 9 Hz activity of moderate voltage (25-35 uV) seen predominantly in posterior head regions, symmetric and reactive to eye opening and eye closing. Sleep was characterized by vertex waves, sleep spindles (12 to 14 Hz), maximal frontocentral region. Hyperventilation and photic stimulation were not performed.   IMPRESSION: This study is within normal limits. No seizures or epileptiform discharges were seen throughout the recording. A normal interictal EEG does not exclude the diagnosis of epilepsy. Charlsie Quest   MR BRAIN WO CONTRAST  Result Date:  07/07/2023 CLINICAL DATA:  Acute neurologic deficit. Right upper extremity tremor. EXAM: MRI HEAD WITHOUT CONTRAST MRI CERVICAL SPINE WITHOUT CONTRAST TECHNIQUE: Multiplanar, multiecho pulse sequences of the brain and surrounding structures, and cervical spine, to include the craniocervical junction and cervicothoracic junction, were obtained without intravenous contrast. Six sequences of the brain are provided. COMPARISON:  03/12/2019 FINDINGS: MRI HEAD FINDINGS Brain: There is a focus of acute/early subacute ischemia at the junction of the left precentral and postcentral gyri. No chronic microhemorrhage or siderosis. Normal white matter signal, parenchymal volume and CSF spaces. The midline structures are normal. Vascular: Normal flow voids. Skull and upper cervical spine: Normal marrow signal. Sinuses/Orbits: Negative. Other: None. MRI CERVICAL SPINE FINDINGS Alignment: Physiologic. Vertebrae: No fracture, evidence of discitis, or bone lesion. Cord: Normal signal and morphology. Posterior Fossa, vertebral arteries, paraspinal tissues: Negative. Disc levels: C1-2: Unremarkable. C2-3: Normal disc space and facet joints. There is no spinal canal stenosis. No neural foraminal stenosis. C3-4: Normal disc space and facet joints. There is no spinal canal stenosis. No neural foraminal stenosis. C4-5: Small right subarticular disc protrusion with endplate osteophytes. Mild spinal canal stenosis. Mild right neural foraminal stenosis. C5-6: Unchanged intermediate sized right uncovertebral disc osteophyte complex. Mild spinal canal stenosis. Mild right neural foraminal stenosis. C6-7: Unchanged intermediate size right subarticular/foraminal disc osteophyte complex with right foraminal disc extrusion with mild superior migration. Mild spinal canal stenosis. Moderate bilateral neural foraminal stenosis. C7-T1: Normal disc space and facet joints. There is no spinal canal stenosis. No neural foraminal stenosis. IMPRESSION: 1.  Focus of acute/early subacute ischemia at  the junction of the left precentral and postcentral gyri. 2. 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 3. Moderate bilateral C7 and mild right C5, C6 neural foraminal stenosis. Electronically Signed   By: Deatra Robinson M.D.   On: 07/07/2023 00:56   MR Cervical Spine Wo Contrast  Result Date: 07/07/2023 CLINICAL DATA:  Acute neurologic deficit. Right upper extremity tremor. EXAM: MRI HEAD WITHOUT CONTRAST MRI CERVICAL SPINE WITHOUT CONTRAST TECHNIQUE: Multiplanar, multiecho pulse sequences of the brain and surrounding structures, and cervical spine, to include the craniocervical junction and cervicothoracic junction, were obtained without intravenous contrast. Six sequences of the brain are provided. COMPARISON:  03/12/2019 FINDINGS: MRI HEAD FINDINGS Brain: There is a focus of acute/early subacute ischemia at the junction of the left precentral and postcentral gyri. No chronic microhemorrhage or siderosis. Normal white matter signal, parenchymal volume and CSF spaces. The midline structures are normal. Vascular: Normal flow voids. Skull and upper cervical spine: Normal marrow signal. Sinuses/Orbits: Negative. Other: None. MRI CERVICAL SPINE FINDINGS Alignment: Physiologic. Vertebrae: No fracture, evidence of discitis, or bone lesion. Cord: Normal signal and morphology. Posterior Fossa, vertebral arteries, paraspinal tissues: Negative. Disc levels: C1-2: Unremarkable. C2-3: Normal disc space and facet joints. There is no spinal canal stenosis. No neural foraminal stenosis. C3-4: Normal disc space and facet joints. There is no spinal canal stenosis. No neural foraminal stenosis. C4-5: Small right subarticular disc protrusion with endplate osteophytes. Mild spinal canal stenosis. Mild right neural foraminal stenosis. C5-6: Unchanged intermediate sized right uncovertebral disc osteophyte complex. Mild spinal canal stenosis. Mild right neural foraminal  stenosis. C6-7: Unchanged intermediate size right subarticular/foraminal disc osteophyte complex with right foraminal disc extrusion with mild superior migration. Mild spinal canal stenosis. Moderate bilateral neural foraminal stenosis. C7-T1: Normal disc space and facet joints. There is no spinal canal stenosis. No neural foraminal stenosis. IMPRESSION: 1. Focus of acute/early subacute ischemia at the junction of the left precentral and postcentral gyri. 2. 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 3. Moderate bilateral C7 and mild right C5, C6 neural foraminal stenosis. Electronically Signed   By: Deatra Robinson M.D.   On: 07/07/2023 00:56   DG Shoulder Right Portable  Result Date: 07/06/2023 CLINICAL DATA:  Right shoulder pain.  Prior surgical repair. EXAM: RIGHT SHOULDER - 1 VIEW COMPARISON:  Right shoulder radiograph dated 06/27/2020. FINDINGS: Fractured screw in the glenoid as seen previously. There is no acute fracture or dislocation. Mild arthritic changes of the glenohumeral joint. The soft tissues are unremarkable. IMPRESSION: 1. No acute fracture or dislocation. 2. Fractured screw in the glenoid. Electronically Signed   By: Elgie Collard M.D.   On: 07/06/2023 22:29   CT Angio Head Neck W WO CM  Result Date: 07/06/2023 CLINICAL DATA:  Numbness.  Acute neurologic deficit. EXAM: CT ANGIOGRAPHY HEAD AND NECK WITH AND WITHOUT CONTRAST TECHNIQUE: Multidetector CT imaging of the head and neck was performed using the standard protocol during bolus administration of intravenous contrast. Multiplanar CT image reconstructions and MIPs were obtained to evaluate the vascular anatomy. Carotid stenosis measurements (when applicable) are obtained utilizing NASCET criteria, using the distal internal carotid diameter as the denominator. RADIATION DOSE REDUCTION: This exam was performed according to the departmental dose-optimization program which includes automated exposure control, adjustment  of the mA and/or kV according to patient size and/or use of iterative reconstruction technique. CONTRAST:  75mL OMNIPAQUE IOHEXOL 350 MG/ML SOLN COMPARISON:  None Available. FINDINGS: CTA NECK FINDINGS SKELETON: No acute abnormality or  high grade bony spinal canal stenosis. OTHER NECK: Normal pharynx, larynx and major salivary glands. No cervical lymphadenopathy. Unremarkable thyroid gland. UPPER CHEST: No pneumothorax or pleural effusion. No nodules or masses. AORTIC ARCH: There is no calcific atherosclerosis of the aortic arch. Conventional 3 vessel aortic branching pattern. RIGHT CAROTID SYSTEM: Normal without aneurysm, dissection or stenosis. LEFT CAROTID SYSTEM: Normal without aneurysm, dissection or stenosis. VERTEBRAL ARTERIES: Left dominant configuration. There is no dissection, occlusion or flow-limiting stenosis to the skull base (V1-V3 segments). CTA HEAD FINDINGS POSTERIOR CIRCULATION: --Vertebral arteries: Normal V4 segments. --Inferior cerebellar arteries: Normal. --Basilar artery: Normal. --Superior cerebellar arteries: Normal. --Posterior cerebral arteries (PCA): Normal. ANTERIOR CIRCULATION: --Intracranial internal carotid arteries: Normal. --Anterior cerebral arteries (ACA): Normal. --Middle cerebral arteries (MCA): Normal. VENOUS SINUSES: As permitted by contrast timing, patent. ANATOMIC VARIANTS: None Review of the MIP images confirms the above findings. IMPRESSION: No emergent large vessel occlusion or high-grade stenosis of the cerebrovascular arteries. Electronically Signed   By: Deatra Robinson M.D.   On: 07/06/2023 20:49   CT HEAD WO CONTRAST  Result Date: 07/06/2023 CLINICAL DATA:  Neuro deficit, persistent or recurrent, CNS neoplasm suspected. Numbness. EXAM: CT HEAD WITHOUT CONTRAST TECHNIQUE: Contiguous axial images were obtained from the base of the skull through the vertex without intravenous contrast. RADIATION DOSE REDUCTION: This exam was performed according to the departmental  dose-optimization program which includes automated exposure control, adjustment of the mA and/or kV according to patient size and/or use of iterative reconstruction technique. COMPARISON:  02/26/2016 FINDINGS: Brain: No evidence of acute infarction, hemorrhage, hydrocephalus, extra-axial collection or mass lesion/mass effect. Vascular: No hyperdense vessel or unexpected calcification. Skull: Normal. Negative for fracture or focal lesion. Calcification between the left occipital condyle and dense, possibly old avulsion injury. Sinuses/Orbits: No acute finding. IMPRESSION: Normal CT of the brain. Electronically Signed   By: Tiburcio Pea M.D.   On: 07/06/2023 18:52    Pending Labs Unresulted Labs (From admission, onward)     Start     Ordered   07/13/23 0500  Creatinine, serum  (enoxaparin (LOVENOX)    CrCl >/= 30 ml/min)  Weekly,   R     Comments: while on enoxaparin therapy    07/06/23 2134   07/07/23 0849  Rapid urine drug screen (hospital performed)  ONCE - STAT,   STAT        07/07/23 0849            Vitals/Pain Today's Vitals   07/07/23 0830 07/07/23 0845 07/07/23 1000 07/07/23 1200  BP:   (!) 135/100   Pulse: (!) 56 60 67   Resp: 15 18 19    Temp:    98.2 F (36.8 C)  TempSrc:    Oral  SpO2: 97% 96% 93%   Weight:      Height:      PainSc:        Isolation Precautions No active isolations  Medications Medications  acetaminophen (TYLENOL) tablet 650 mg (has no administration in time range)    Or  acetaminophen (TYLENOL) 160 MG/5ML solution 650 mg (has no administration in time range)    Or  acetaminophen (TYLENOL) suppository 650 mg (has no administration in time range)  senna-docusate (Senokot-S) tablet 1 tablet (has no administration in time range)  enoxaparin (LOVENOX) injection 40 mg (40 mg Subcutaneous Given 07/07/23 0138)  insulin aspart (novoLOG) injection 0-15 Units (2 Units Subcutaneous Given 07/07/23 0944)  insulin aspart (novoLOG) injection 0-5 Units (  Subcutaneous Not Given 07/07/23 0053)  aspirin EC tablet 81 mg (81 mg Oral Given 07/07/23 0938)  clopidogrel (PLAVIX) tablet 75 mg (75 mg Oral Given 07/07/23 0938)  levETIRAcetam (KEPPRA) tablet 500 mg (500 mg Oral Given 07/07/23 1307)  HYDROmorphone (DILAUDID) injection 0.5 mg (0.5 mg Intravenous Given 07/06/23 1926)  iohexol (OMNIPAQUE) 350 MG/ML injection 75 mL (75 mLs Intravenous Contrast Given 07/06/23 2024)   stroke: early stages of recovery book ( Does not apply Given 07/07/23 0939)    Mobility walks     Focused Assessments Neuro Assessment Handoff:  Swallow screen pass? Yes  Cardiac Rhythm: Normal sinus rhythm NIH Stroke Scale  Dizziness Present: No Headache Present: No Interval: Other (Comment) Level of Consciousness (1a.)   : Alert, keenly responsive LOC Questions (1b. )   : Answers both questions correctly LOC Commands (1c. )   : Performs both tasks correctly Best Gaze (2. )  : Normal Visual (3. )  : No visual loss Facial Palsy (4. )    : Normal symmetrical movements Motor Arm, Left (5a. )   : No drift Motor Arm, Right (5b. ) : No drift Motor Leg, Left (6a. )  : No drift Motor Leg, Right (6b. ) : No drift Limb Ataxia (7. ): Absent Sensory (8. )  : (S) Mild-to-moderate sensory loss, patient feels pinprick is less sharp or is dull on the affected side, or there is a loss of superficial pain with pinprick, but patient is aware of being touched (right leg/hip numbness but decreased from last night) Best Language (9. )  : No aphasia Dysarthria (10. ): Normal Extinction/Inattention (11.)   : No Abnormality Complete NIHSS TOTAL: 1     Neuro Assessment: Exceptions to WDL Neuro Checks:   Initial (07/06/23 1515)  Has TPA been given? No If patient is a Neuro Trauma and patient is going to OR before floor call report to 4N Charge nurse: [phone] or [phone]   R Recommendations: See Admitting Provider Note  Report given to:   Additional Notes:

## 2023-07-07 NOTE — Progress Notes (Signed)
Pt arrived on unit. Pt able to ambulate and steadily walk from emergency department bed to bed in west. Oriented pt to the floor. Bed is locked at its lowest position and call bell is within reach.

## 2023-07-07 NOTE — Procedures (Signed)
Patient Name: Brian Reid  MRN: [id number]  Epilepsy Attending: Charlsie Quest  Referring Physician/Provider: Erick Blinks, MD  Date: 07/07/2023 Duration: 24.15 mins  Patient history: 47 y.o. male who presents with episode of right arm weakness associated with some tremoring along with right arm and right leg numbness and weakness. EEG to evaluate for seizure  Level of alertness: Awake, asleep  AEDs during EEG study: None  Technical aspects: This EEG study was done with scalp electrodes positioned according to the 10-20 International system of electrode placement. Electrical activity was reviewed with band pass filter of 1-70Hz , sensitivity of 7 uV/mm, display speed of 44mm/sec with a 60Hz  notched filter applied as appropriate. EEG data were recorded continuously and digitally stored.  Video monitoring was available and reviewed as appropriate.  Description: The posterior dominant rhythm consists of 9 Hz activity of moderate voltage (25-35 uV) seen predominantly in posterior head regions, symmetric and reactive to eye opening and eye closing. Sleep was characterized by vertex waves, sleep spindles (12 to 14 Hz), maximal frontocentral region. Hyperventilation and photic stimulation were not performed.     IMPRESSION: This study is within normal limits. No seizures or epileptiform discharges were seen throughout the recording.  A normal interictal EEG does not exclude the diagnosis of epilepsy.  Bernardina Cacho Annabelle Harman

## 2023-07-07 NOTE — Plan of Care (Signed)
  Problem: Education: Goal: Knowledge of disease or condition will improve Outcome: Progressing   Problem: Ischemic Stroke/TIA Tissue Perfusion: Goal: Complications of ischemic stroke/TIA will be minimized Outcome: Progressing   Problem: Nutrition: Goal: Risk of aspiration will decrease Outcome: Progressing   Problem: Metabolic: Goal: Ability to maintain appropriate glucose levels will improve Outcome: Progressing   Problem: Nutritional: Goal: Maintenance of adequate nutrition will improve Outcome: Progressing

## 2023-07-07 NOTE — Evaluation (Signed)
Speech Language Pathology Evaluation Patient Details Name: Brian Reid MRN: [id number] DOB: April 02, 1976 Today's Date: 07/07/2023 Time: 4696-2952 SLP Time Calculation (min) (ACUTE ONLY): 15 min  Problem List:  Patient Active Problem List   Diagnosis Date Noted   TIA (transient ischemic attack) 07/06/2023   Elevated BP without diagnosis of hypertension 07/06/2023   Pain in left hand 09/09/2021   Left forearm pain 04/12/2021   Acute appendicitis s/p lap appendectomy 04/02/2016 04/03/2016   Past Medical History:  Past Medical History:  Diagnosis Date   Asthma    Past Surgical History:  Past Surgical History:  Procedure Laterality Date   APPENDECTOMY N/A    [employer] 06/27/2020   LAPAROSCOPIC APPENDECTOMY N/A 04/02/2016   Procedure: APPENDECTOMY LAPAROSCOPIC;  Surgeon: Chevis Pretty III, MD;  Location: [facility];  Service: General;  Laterality: N/A;   LEG SURGERY     age 47   OPEN REDUCTION SHOULDER DISLOCATION     age 38   HPI:  Brian Reid is a 47 year old male who presented to ED after he found he was having difficulty moving his right arm during lunch.  MRI 12/3: "Focus of acute/early subacute ischemia at the junction of the left precentral and postcentral gyri."  Pt with past medical history significant for T2DM, diet controlled HTN/HLD.   Assessment / Plan / Recommendation Clinical Impression  Pt presents with cognitive linguistic abilities within the normal limits.  He was assessed using the COGNISTAT and preformed within the average range on all subtests administered.  Visuospatial ability assessed using clock drawing which was 100% accurate.  Pt reports no significant changes.  He does feel that after he wakes up instead of immediately knowing what his plans for today are, he puts it together after going to the bathroom.  He expects to be able to resume his daily activities without difficulty, but couseled pt to speak to physician if he has any difficulties.  Pt has no further ST  needs.  SLP will sign off.  COGNISTAT: All subtests are within the average range, except where otherwise specified.  Orientation:  12/12 Attention: 8/8 Comprehension: passed screen Repetition: 12/12 Naming: 7/8 Construction: clock drawing, 100% accurate Memory: 12/12 Calculations: 4/4 Similarities: 7/8 Judgment: 6/6      SLP Assessment  SLP Recommendation/Assessment: Patient does not need any further Speech Lanaguage Pathology Services    Recommendations for follow up therapy are one component of a multi-disciplinary discharge planning process, led by the attending physician.  Recommendations may be updated based on patient status, additional functional criteria and insurance authorization.    Follow Up Recommendations  No SLP follow up    Assistance Recommended at Discharge  None  Functional Status Assessment Patient has not had a recent decline in their functional status  Frequency and Duration  N/A         SLP Evaluation Cognition  Overall Cognitive Status: Within Functional Limits for tasks assessed Arousal/Alertness: Awake/alert Orientation Level: Oriented X4 Year: 2024 Month: December Day of Week: Correct Attention: Focused;Sustained Focused Attention: Appears intact Sustained Attention: Appears intact Memory: Appears intact Awareness: Appears intact Problem Solving: Appears intact Executive Function: Reasoning Reasoning: Appears intact       Comprehension  Auditory Comprehension Overall Auditory Comprehension: Appears within functional limits for tasks assessed Commands: Within Functional Limits Conversation: Complex Visual Recognition/Discrimination Discrimination: Not tested Reading Comprehension Reading Status: Not tested    Expression Expression Primary Mode of Expression: Verbal Verbal Expression Overall Verbal Expression: Appears within functional limits for tasks assessed Initiation: No  impairment Level of Generative/Spontaneous  Verbalization: Conversation Repetition: No impairment Naming: No impairment Pragmatics: No impairment Written Expression Dominant Hand: Right Written Expression: Not tested   Oral / Motor  Motor Speech Overall Motor Speech: Appears within functional limits for tasks assessed Respiration: Within functional limits Resonance: Within functional limits Articulation: Within functional limitis Intelligibility: Intelligible Motor Planning: Witnin functional limits Motor Speech Errors: Not applicable            Kerrie Pleasure, MA, CCC-SLP Acute Rehabilitation Services Office: [phone] 07/07/2023, 9:53 AM

## 2023-07-08 ENCOUNTER — Observation Stay (HOSPITAL_COMMUNITY): Payer: Managed Care, Other (non HMO)

## 2023-07-08 ENCOUNTER — Telehealth: Payer: Self-pay | Admitting: Physician Assistant

## 2023-07-08 ENCOUNTER — Observation Stay (HOSPITAL_BASED_OUTPATIENT_CLINIC_OR_DEPARTMENT_OTHER): Payer: Managed Care, Other (non HMO)

## 2023-07-08 DIAGNOSIS — G459 Transient cerebral ischemic attack, unspecified: Secondary | ICD-10-CM

## 2023-07-08 DIAGNOSIS — I639 Cerebral infarction, unspecified: Secondary | ICD-10-CM

## 2023-07-08 LAB — CARDIOLIPIN ANTIBODIES, IGG, IGM, IGA
Anticardiolipin IgA: <9 [APL'U]/mL (ref 0–11)
Anticardiolipin IgG: <9 [GPL'U]/mL (ref 0–14)
Anticardiolipin IgM: 9 [MPL'U]/mL (ref 0–12)

## 2023-07-08 LAB — RAPID URINE DRUG SCREEN, HOSP PERFORMED
Amphetamines: NOT DETECTED
Barbiturates: NOT DETECTED
Benzodiazepines: NOT DETECTED
Cocaine: NOT DETECTED
Opiates: NOT DETECTED
Tetrahydrocannabinol: NOT DETECTED

## 2023-07-08 LAB — GLUCOSE, CAPILLARY
Glucose-Capillary: 120 mg/dL — ABNORMAL HIGH (ref 70–99)
Glucose-Capillary: 157 mg/dL — ABNORMAL HIGH (ref 70–99)

## 2023-07-08 LAB — ANA W/REFLEX IF POSITIVE: Anti Nuclear Antibody (ANA): NEGATIVE

## 2023-07-08 MED ORDER — ATORVASTATIN CALCIUM 40 MG PO TABS: 40.0000 mg | ORAL_TABLET | Freq: Every day | ORAL | 0 refills | Status: DC

## 2023-07-08 MED ORDER — ASPIRIN 81 MG PO TBEC: 81.0000 mg | DELAYED_RELEASE_TABLET | Freq: Every day | ORAL | 0 refills | Status: AC

## 2023-07-08 MED ORDER — STROKE: EARLY STAGES OF RECOVERY BOOK
Status: AC
  Filled 2023-07-08: qty 1

## 2023-07-08 MED ORDER — CLOPIDOGREL BISULFATE 75 MG PO TABS: 75.0000 mg | ORAL_TABLET | Freq: Every day | ORAL | 0 refills | Status: AC

## 2023-07-08 NOTE — Progress Notes (Signed)
Transcranial doppler with bubbles and bilateral lower extremity venous duplex has been completed. Preliminary results can be found in CV Proc through chart review.   07/08/23 1:40 PM Olen Cordial RVT

## 2023-07-08 NOTE — Progress Notes (Signed)
PROGRESS NOTE    Brian Reid  DGU:[id number] DOB: 10/29/1975 DOA: 07/06/2023 PCP: [name], MD    Brief Narrative:   Brian Reid is a 47 y.o. male with past medical history significant for type 2 diabetes mellitus, HTN, HLD, asthma, history of tobacco abuse who presented to [facility] ED on 12/2 with complaints of right-sided numbness and weakness.  While having lunch, he was having difficulty moving his right arm and became tremulous and unable to hold anything with his right hand.  Also endorsed lightheadedness, dizziness.  Given his symptoms he drove himself to the ED.  On arrival to the ED he was having difficulty walking with right lower extremity numbness and weakness as well.  Denies headaches, no nausea/vomiting, no slurred speech, no vision changes.  In the ED, temperature 98.1 F, HR 73, RR 18, BP 161/113, SpO2 99% room air.  WBC 6.3, hemoglobin 14.7, platelet count 214.  Sodium 135, potassium 3.8, chloride 100, CO2 26, glucose 187, BUN 11, creatinine 0.95.  AST 27, ALT 44, total bilirubin 0.9.  Urinalysis unrevealing.  UDS negative.  EtOH level less than 10.  Right shoulder x-ray with no acute fracture or dislocation, fracture screws noted in the glenoid, as seen previously.  CT head without contrast negative for acute intracranial abnormality.  CT angiogram head/neck with no emergent large vessel occlusion or high-grade stenosis of the cerebrovascular arteries.  MR brain without contrast with focus of acute/early subacute ischemia of the junction of the left precentral and postcentral gyri.  CT C-spine with mild spinal canal stenosis C4-5, C5-6 and C6/7, moderate bilateral C7 and mild right C5, C6 neuroforaminal stenosis.  Neurology was consulted.  TRH consulted for admission for further evaluation management of acute versus subacute CVA.  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  MRI brain without  contrast with focus of acute/early subacute ischemia of the junction of the left precentral and postcentral gyri.  CT angiogram head/neck with no emergent large vessel occlusion or high-grade stenosis.  LDL 109, hemoglobin A1c 7.6.  TTE with LVEF 60 to 65%, no interatrial shunt. -- Neurology following, appreciate assistance -- Vas duplex ultrasound bilateral lower extremities: Pending -- TCD bubble study: Pending -- PT recommends outpatient PT -- OT with no recommendations -- Neurology recommends outpatient TEE and 30-day cardiac event monitor -- DAPT with aspirin 81 mg p.o. daily and Plavix 70 mg p.o. daily x 3 weeks followed by aspirin alone -- Started on atorvastatin 40 mg p.o. daily  Essential hypertension Not on antihypertensives outpatient, BP 117/73 this morning. -- Continue monitor BP closely, allowing permissive hypertension up to 220/110 for the first 24-48 hours for acute CVA as above  Type 2 diabetes mellitus with hyperglycemia Hemoglobin A1c 7.6, not optimally controlled.  On metformin 5 mg p.o. twice daily outpatient. -- Holding oral hypoglycemics while inpatient -- SSI for coverage -- CBGs qAC/HS  Dyslipidemia Lipid panel with total cholesterol 206, HDL 30, LDL 109, triglycerides 333. --Started on atorvastatin 40 mg p.o. daily  History of tobacco abuse Counseled on need for continued abstinence/cessation.   DVT prophylaxis: enoxaparin (LOVENOX) injection 40 mg Start: 07/06/23 2145    Code Status: Full Code Family Communication:   Disposition Plan:  Level of care: Telemetry Medical Status is: Observation The patient remains OBS appropriate and will d/c before 2 midnights.    Consultants:  Neurology  Procedures:  TCD bubble study: Pending Vascular duplex ultrasound bilateral  lower extremities: Pending  Antimicrobials:  None   Subjective: Patient seen examined bedside, resting calmly.  Lying in bed.  Continues with some mild right lower extremity  weakness, slowly improving.  Weakness to right upper extremity relatively resolved.  Undergoing TCD bubble study and vascular duplex ultrasound bilateral lower extremities today.  Awaiting further recommendations from neurology.  Patient with no other specific questions, concerns or complaints at this time.  Denies headache, no visual changes, no chest pain, no abdominal pain, no fatigue, no paresthesia.  No acute events overnight per nursing staff.  Objective: Vitals:   07/07/23 2352 07/08/23 0428 07/08/23 0804 07/08/23 1201  BP: (!) 145/96 107/73 105/67 127/81  Pulse: 62 (!) 54 (!) 58 61  Resp:    20  Temp: 98 F (36.7 C) 98.1 F (36.7 C) 98 F (36.7 C) 98.7 F (37.1 C)  TempSrc: Oral Axillary Oral Oral  SpO2: 98% 95% 100% 100%  Weight:      Height:        Intake/Output Summary (Last 24 hours) at 07/08/2023 1321 Last data filed at 07/08/2023 0350 Gross per 24 hour  Intake 368 ml  Output --  Net 368 ml   Filed Weights   07/06/23 1510  Weight: 104.3 kg    Examination:  Physical Exam: GEN: NAD, alert and oriented x 3, obese HEENT: NCAT, PERRL, EOMI, sclera clear, MMM PULM: CTAB w/o wheezes/crackles, normal respiratory effort, on room air CV: RRR w/o M/G/R GI: abd soft, NTND, NABS, no R/G/M MSK: no peripheral edema, right lower extremity muscle strength slightly diminished 4/5, otherwise globally preserved throughout NEURO: CN II-XII intact, muscle strength right lower extremity slightly reduced as above, otherwise no other focal deficits, sensation to light touch intact PSYCH: normal mood/affect Integumentary: dry/intact, no rashes or wounds    Data Reviewed: I have personally reviewed following labs and imaging studies  CBC: Recent Labs  Lab 07/06/23 1522 07/06/23 1535 07/07/23 0041 07/07/23 0640  WBC 6.3  --  7.4 6.2  NEUTROABS 3.5  --   --   --   HGB 14.7 15.0 13.3 13.7  HCT 44.0 44.0 40.3 42.4  MCV 78.6*  --  77.4* 77.7*  PLT 214  --  187 192   Basic  Metabolic Panel: Recent Labs  Lab 07/06/23 1522 07/06/23 1535 07/07/23 0041  NA 135 138  --   K 3.8 3.8  --   CL 100 98  --   CO2 26  --   --   GLUCOSE 197* 197*  --   BUN 11 11  --   CREATININE 0.95 0.90 0.93  CALCIUM 9.1  --   --    GFR: Estimated Creatinine Clearance: 118.8 mL/min (by C-G formula based on SCr of 0.93 mg/dL). Liver Function Tests: Recent Labs  Lab 07/06/23 1522  AST 27  ALT 44  ALKPHOS 46  BILITOT 0.9  PROT 6.7  ALBUMIN 3.9   No results for input(s): "LIPASE", "AMYLASE" in the last 168 hours. No results for input(s): "AMMONIA" in the last 168 hours. Coagulation Profile: Recent Labs  Lab 07/06/23 1522  INR 1.0   Cardiac Enzymes: No results for input(s): "CKTOTAL", "CKMB", "CKMBINDEX", "TROPONINI" in the last 168 hours. BNP (last 3 results) No results for input(s): "PROBNP" in the last 8760 hours. HbA1C: No results for input(s): "HGBA1C" in the last 72 hours. CBG: Recent Labs  Lab 07/07/23 1323 07/07/23 1640 07/07/23 2125 07/08/23 0637 07/08/23 1158  GLUCAP 143* 124* 249* 120* 157*  Lipid Profile: Recent Labs    07/07/23 0640  CHOL 206*  HDL 30*  LDLCALC 109*  TRIG 333*  CHOLHDL 6.9   Thyroid Function Tests: No results for input(s): "TSH", "T4TOTAL", "FREET4", "T3FREE", "THYROIDAB" in the last 72 hours. Anemia Panel: No results for input(s): "VITAMINB12", "FOLATE", "FERRITIN", "TIBC", "IRON", "RETICCTPCT" in the last 72 hours. Sepsis Labs: No results for input(s): "PROCALCITON", "LATICACIDVEN" in the last 168 hours.  No results found for this or any previous visit (from the past 240 hour(s)).       Radiology Studies: ECHOCARDIOGRAM COMPLETE BUBBLE STUDY  Result Date: 07/07/2023    ECHOCARDIOGRAM REPORT   Patient Name:   Brian Reid Date of Exam: 07/07/2023 Medical Rec #:  [id number]    Height:       70.0 in Accession #:    [id number]   Weight:       230.0 lb Date of Birth:  [birth year]     BSA:          2.215 m Patient Age:     47 years     BP:           123/88 mmHg Patient Gender: M            HR:           75 bpm. Exam Location:  Inpatient Procedure: 2D Echo, Cardiac Doppler, Color Doppler and Saline Contrast Bubble            Study Indications:    TIA 435.9/G45.9  History:        Patient has no prior history of Echocardiogram examinations.                 TIA.  Sonographer:    Lucendia Herrlich RCS Referring Phys: [phone] DEBBY CROSLEY IMPRESSIONS  1. Left ventricular ejection fraction, by estimation, is 60 to 65%. The left ventricle has normal function. The left ventricle has no regional wall motion abnormalities. Left ventricular diastolic parameters are indeterminate.  2. Right ventricular systolic function is normal. The right ventricular size is normal.  3. The mitral valve is grossly normal. No evidence of mitral valve regurgitation.  4. The aortic valve is tricuspid. Aortic valve regurgitation is not visualized. No aortic stenosis is present.  5. The inferior vena cava is normal in size with greater than 50% respiratory variability, suggesting right atrial pressure of 3 mmHg.  6. Agitated saline contrast bubble study was negative, with no evidence of any interatrial shunt. Comparison(s): No prior Echocardiogram. FINDINGS  Left Ventricle: Left ventricular ejection fraction, by estimation, is 60 to 65%. The left ventricle has normal function. The left ventricle has no regional wall motion abnormalities. The left ventricular internal cavity size was normal in size. There is  no left ventricular hypertrophy. Left ventricular diastolic parameters are indeterminate. Right Ventricle: The right ventricular size is normal. No increase in right ventricular wall thickness. Right ventricular systolic function is normal. Left Atrium: Left atrial size was normal in size. Right Atrium: Right atrial size was normal in size. Pericardium: Trivial pericardial effusion is present. The pericardial effusion is posterior to the left ventricle. Mitral  Valve: The mitral valve is grossly normal. No evidence of mitral valve regurgitation. Tricuspid Valve: The tricuspid valve is normal in structure. Tricuspid valve regurgitation is not demonstrated. No evidence of tricuspid stenosis. Aortic Valve: The aortic valve is tricuspid. Aortic valve regurgitation is not visualized. No aortic stenosis is present. Aortic valve peak gradient measures 6.1 mmHg. Pulmonic Valve: The  pulmonic valve was normal in structure. Pulmonic valve regurgitation is not visualized. No evidence of pulmonic stenosis. Aorta: The aortic root and ascending aorta are structurally normal, with no evidence of dilitation. Venous: The inferior vena cava is normal in size with greater than 50% respiratory variability, suggesting right atrial pressure of 3 mmHg. IAS/Shunts: No atrial level shunt detected by color flow Doppler. Agitated saline contrast was given intravenously to evaluate for intracardiac shunting. Agitated saline contrast bubble study was negative, with no evidence of any interatrial shunt.  LEFT VENTRICLE PLAX 2D LVIDd:         4.70 cm   Diastology LVIDs:         3.00 cm   LV e' medial:    6.85 cm/s LV PW:         0.90 cm   LV E/e' medial:  8.9 LV IVS:        0.80 cm   LV e' lateral:   8.49 cm/s LVOT diam:     2.00 cm   LV E/e' lateral: 7.2 LV SV:         58 LV SV Index:   26 LVOT Area:     3.14 cm  RIGHT VENTRICLE             IVC RV S prime:     15.70 cm/s  IVC diam: 1.70 cm TAPSE (M-mode): 1.7 cm LEFT ATRIUM             Index        RIGHT ATRIUM           Index LA diam:        3.70 cm 1.67 cm/m   RA Area:     14.20 cm LA Vol (A2C):   35.7 ml 16.12 ml/m  RA Volume:   31.40 ml  14.18 ml/m LA Vol (A4C):   34.0 ml 15.35 ml/m LA Biplane Vol: 35.0 ml 15.80 ml/m  AORTIC VALVE AV Area (Vmax): 2.33 cm AV Vmax:        123.00 cm/s AV Peak Grad:   6.1 mmHg LVOT Vmax:      91.30 cm/s LVOT Vmean:     58.500 cm/s LVOT VTI:       0.186 m  AORTA Ao Root diam: 3.50 cm Ao Asc diam:  3.50 cm MITRAL  VALVE               TRICUSPID VALVE MV Area (PHT): 3.37 cm    TR Peak grad:   5.1 mmHg MV Decel Time: 225 msec    TR Vmax:        113.00 cm/s MV E velocity: 61.10 cm/s MV A velocity: 79.10 cm/s  SHUNTS MV E/A ratio:  0.77        Systemic VTI:  0.19 m                            Systemic Diam: 2.00 cm Riley Lam MD Electronically signed by Riley Lam MD Signature Date/Time: 07/07/2023/12:40:37 PM    Final    EEG adult  Result Date: 07/07/2023 Charlsie Quest, MD     07/07/2023  8:48 AM Patient Name: Brian Reid MRN: [id number] Epilepsy Attending: Charlsie Quest Referring Physician/Provider: Erick Blinks, MD Date: 07/07/2023 Duration: 24.15 mins Patient history: 47 y.o. male who presents with episode of right arm weakness associated with some tremoring along with right arm and right leg numbness and weakness. EEG  to evaluate for seizure Level of alertness: Awake, asleep AEDs during EEG study: None Technical aspects: This EEG study was done with scalp electrodes positioned according to the 10-20 International system of electrode placement. Electrical activity was reviewed with band pass filter of 1-70Hz , sensitivity of 7 uV/mm, display speed of 39mm/sec with a 60Hz  notched filter applied as appropriate. EEG data were recorded continuously and digitally stored.  Video monitoring was available and reviewed as appropriate. Description: The posterior dominant rhythm consists of 9 Hz activity of moderate voltage (25-35 uV) seen predominantly in posterior head regions, symmetric and reactive to eye opening and eye closing. Sleep was characterized by vertex waves, sleep spindles (12 to 14 Hz), maximal frontocentral region. Hyperventilation and photic stimulation were not performed.   IMPRESSION: This study is within normal limits. No seizures or epileptiform discharges were seen throughout the recording. A normal interictal EEG does not exclude the diagnosis of epilepsy. Charlsie Quest   MR  BRAIN WO CONTRAST  Result Date: 07/07/2023 CLINICAL DATA:  Acute neurologic deficit. Right upper extremity tremor. EXAM: MRI HEAD WITHOUT CONTRAST MRI CERVICAL SPINE WITHOUT CONTRAST TECHNIQUE: Multiplanar, multiecho pulse sequences of the brain and surrounding structures, and cervical spine, to include the craniocervical junction and cervicothoracic junction, were obtained without intravenous contrast. Six sequences of the brain are provided. COMPARISON:  03/12/2019 FINDINGS: MRI HEAD FINDINGS Brain: There is a focus of acute/early subacute ischemia at the junction of the left precentral and postcentral gyri. No chronic microhemorrhage or siderosis. Normal white matter signal, parenchymal volume and CSF spaces. The midline structures are normal. Vascular: Normal flow voids. Skull and upper cervical spine: Normal marrow signal. Sinuses/Orbits: Negative. Other: None. MRI CERVICAL SPINE FINDINGS Alignment: Physiologic. Vertebrae: No fracture, evidence of discitis, or bone lesion. Cord: Normal signal and morphology. Posterior Fossa, vertebral arteries, paraspinal tissues: Negative. Disc levels: C1-2: Unremarkable. C2-3: Normal disc space and facet joints. There is no spinal canal stenosis. No neural foraminal stenosis. C3-4: Normal disc space and facet joints. There is no spinal canal stenosis. No neural foraminal stenosis. C4-5: Small right subarticular disc protrusion with endplate osteophytes. Mild spinal canal stenosis. Mild right neural foraminal stenosis. C5-6: Unchanged intermediate sized right uncovertebral disc osteophyte complex. Mild spinal canal stenosis. Mild right neural foraminal stenosis. C6-7: Unchanged intermediate size right subarticular/foraminal disc osteophyte complex with right foraminal disc extrusion with mild superior migration. Mild spinal canal stenosis. Moderate bilateral neural foraminal stenosis. C7-T1: Normal disc space and facet joints. There is no spinal canal stenosis. No neural  foraminal stenosis. IMPRESSION: 1. Focus of acute/early subacute ischemia at the junction of the left precentral and postcentral gyri. 2. 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 3. Moderate bilateral C7 and mild right C5, C6 neural foraminal stenosis. Electronically Signed   By: Deatra Robinson M.D.   On: 07/07/2023 00:56   MR Cervical Spine Wo Contrast  Result Date: 07/07/2023 CLINICAL DATA:  Acute neurologic deficit. Right upper extremity tremor. EXAM: MRI HEAD WITHOUT CONTRAST MRI CERVICAL SPINE WITHOUT CONTRAST TECHNIQUE: Multiplanar, multiecho pulse sequences of the brain and surrounding structures, and cervical spine, to include the craniocervical junction and cervicothoracic junction, were obtained without intravenous contrast. Six sequences of the brain are provided. COMPARISON:  03/12/2019 FINDINGS: MRI HEAD FINDINGS Brain: There is a focus of acute/early subacute ischemia at the junction of the left precentral and postcentral gyri. No chronic microhemorrhage or siderosis. Normal white matter signal, parenchymal volume and CSF spaces. The midline structures are normal. Vascular: Normal  flow voids. Skull and upper cervical spine: Normal marrow signal. Sinuses/Orbits: Negative. Other: None. MRI CERVICAL SPINE FINDINGS Alignment: Physiologic. Vertebrae: No fracture, evidence of discitis, or bone lesion. Cord: Normal signal and morphology. Posterior Fossa, vertebral arteries, paraspinal tissues: Negative. Disc levels: C1-2: Unremarkable. C2-3: Normal disc space and facet joints. There is no spinal canal stenosis. No neural foraminal stenosis. C3-4: Normal disc space and facet joints. There is no spinal canal stenosis. No neural foraminal stenosis. C4-5: Small right subarticular disc protrusion with endplate osteophytes. Mild spinal canal stenosis. Mild right neural foraminal stenosis. C5-6: Unchanged intermediate sized right uncovertebral disc osteophyte complex. Mild spinal canal  stenosis. Mild right neural foraminal stenosis. C6-7: Unchanged intermediate size right subarticular/foraminal disc osteophyte complex with right foraminal disc extrusion with mild superior migration. Mild spinal canal stenosis. Moderate bilateral neural foraminal stenosis. C7-T1: Normal disc space and facet joints. There is no spinal canal stenosis. No neural foraminal stenosis. IMPRESSION: 1. Focus of acute/early subacute ischemia at the junction of the left precentral and postcentral gyri. 2. 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 3. Moderate bilateral C7 and mild right C5, C6 neural foraminal stenosis. Electronically Signed   By: Deatra Robinson M.D.   On: 07/07/2023 00:56   DG Shoulder Right Portable  Result Date: 07/06/2023 CLINICAL DATA:  Right shoulder pain.  Prior surgical repair. EXAM: RIGHT SHOULDER - 1 VIEW COMPARISON:  Right shoulder radiograph dated 06/27/2020. FINDINGS: Fractured screw in the glenoid as seen previously. There is no acute fracture or dislocation. Mild arthritic changes of the glenohumeral joint. The soft tissues are unremarkable. IMPRESSION: 1. No acute fracture or dislocation. 2. Fractured screw in the glenoid. Electronically Signed   By: Elgie Collard M.D.   On: 07/06/2023 22:29   CT Angio Head Neck W WO CM  Result Date: 07/06/2023 CLINICAL DATA:  Numbness.  Acute neurologic deficit. EXAM: CT ANGIOGRAPHY HEAD AND NECK WITH AND WITHOUT CONTRAST TECHNIQUE: Multidetector CT imaging of the head and neck was performed using the standard protocol during bolus administration of intravenous contrast. Multiplanar CT image reconstructions and MIPs were obtained to evaluate the vascular anatomy. Carotid stenosis measurements (when applicable) are obtained utilizing NASCET criteria, using the distal internal carotid diameter as the denominator. RADIATION DOSE REDUCTION: This exam was performed according to the departmental dose-optimization program which includes  automated exposure control, adjustment of the mA and/or kV according to patient size and/or use of iterative reconstruction technique. CONTRAST:  75mL OMNIPAQUE IOHEXOL 350 MG/ML SOLN COMPARISON:  None Available. FINDINGS: CTA NECK FINDINGS SKELETON: No acute abnormality or high grade bony spinal canal stenosis. OTHER NECK: Normal pharynx, larynx and major salivary glands. No cervical lymphadenopathy. Unremarkable thyroid gland. UPPER CHEST: No pneumothorax or pleural effusion. No nodules or masses. AORTIC ARCH: There is no calcific atherosclerosis of the aortic arch. Conventional 3 vessel aortic branching pattern. RIGHT CAROTID SYSTEM: Normal without aneurysm, dissection or stenosis. LEFT CAROTID SYSTEM: Normal without aneurysm, dissection or stenosis. VERTEBRAL ARTERIES: Left dominant configuration. There is no dissection, occlusion or flow-limiting stenosis to the skull base (V1-V3 segments). CTA HEAD FINDINGS POSTERIOR CIRCULATION: --Vertebral arteries: Normal V4 segments. --Inferior cerebellar arteries: Normal. --Basilar artery: Normal. --Superior cerebellar arteries: Normal. --Posterior cerebral arteries (PCA): Normal. ANTERIOR CIRCULATION: --Intracranial internal carotid arteries: Normal. --Anterior cerebral arteries (ACA): Normal. --Middle cerebral arteries (MCA): Normal. VENOUS SINUSES: As permitted by contrast timing, patent. ANATOMIC VARIANTS: None Review of the MIP images confirms the above findings. IMPRESSION: No emergent large vessel occlusion or high-grade stenosis  of the cerebrovascular arteries. Electronically Signed   By: Deatra Robinson M.D.   On: 07/06/2023 20:49   CT HEAD WO CONTRAST  Result Date: 07/06/2023 CLINICAL DATA:  Neuro deficit, persistent or recurrent, CNS neoplasm suspected. Numbness. EXAM: CT HEAD WITHOUT CONTRAST TECHNIQUE: Contiguous axial images were obtained from the base of the skull through the vertex without intravenous contrast. RADIATION DOSE REDUCTION: This exam was  performed according to the departmental dose-optimization program which includes automated exposure control, adjustment of the mA and/or kV according to patient size and/or use of iterative reconstruction technique. COMPARISON:  02/26/2016 FINDINGS: Brain: No evidence of acute infarction, hemorrhage, hydrocephalus, extra-axial collection or mass lesion/mass effect. Vascular: No hyperdense vessel or unexpected calcification. Skull: Normal. Negative for fracture or focal lesion. Calcification between the left occipital condyle and dense, possibly old avulsion injury. Sinuses/Orbits: No acute finding. IMPRESSION: Normal CT of the brain. Electronically Signed   By: Tiburcio Pea M.D.   On: 07/06/2023 18:52        Scheduled Meds:   stroke: early stages of recovery book       aspirin EC  81 mg Oral Daily   atorvastatin  40 mg Oral Daily   clopidogrel  75 mg Oral Daily   enoxaparin (LOVENOX) injection  40 mg Subcutaneous Q2000   insulin aspart  0-15 Units Subcutaneous TID WC   insulin aspart  0-5 Units Subcutaneous QHS   levETIRAcetam  500 mg Oral BID   Continuous Infusions:   LOS: 0 days    Time spent: 52 minutes spent on chart review, discussion with nursing staff, consultants, updating family and interview/physical exam; more than 50% of that time was spent in counseling and/or coordination of care.    Alvira Philips [place], DO [facility] Available via Epic secure chat 7am-7pm After these hours, please refer to coverage provider listed on [email or web address] 07/08/2023, 1:21 PM

## 2023-07-08 NOTE — Discharge Summary (Signed)
Physician Discharge Summary  Brian Reid EXB:[id number] DOB: 08-10-1975 DOA: 07/06/2023  PCP: [name], MD  Admit date: 07/06/2023 Discharge date: 07/08/2023  Admitted From: Home Disposition: Home  Recommendations for Outpatient Follow-up:  Follow up with PCP in 1-2 weeks Follow-up with neurology 6 weeks in regards to acute CVA Continue DAPT with aspirin/Plavix x 21 days followed by aspirin alone Started on atorvastatin 40 mg p.o. daily Needs close follow-up with PCP regarding diabetes management Recommend consideration of outpatient sleep study  Home Health: Outpatient physical therapy Equipment/Devices: None  Discharge Condition: Stable CODE STATUS: Full code Diet recommendation: Heart healthy/consistent carbohydrate diet  History of present illness:  Brian Reid is a 47 y.o. male with past medical history significant for type 2 diabetes mellitus, HTN, HLD, asthma, history of tobacco abuse who presented to [facility] ED on 12/2 with complaints of right-sided numbness and weakness.  While having lunch, he was having difficulty moving his right arm and became tremulous and unable to hold anything with his right hand.  Also endorsed lightheadedness, dizziness.  Given his symptoms he drove himself to the ED.  On arrival to the ED he was having difficulty walking with right lower extremity numbness and weakness as well.  Denies headaches, no nausea/vomiting, no slurred speech, no vision changes.   In the ED, temperature 98.1 F, HR 73, RR 18, BP 161/113, SpO2 99% room air.  WBC 6.3, hemoglobin 14.7, platelet count 214.  Sodium 135, potassium 3.8, chloride 100, CO2 26, glucose 187, BUN 11, creatinine 0.95.  AST 27, ALT 44, total bilirubin 0.9.  Urinalysis unrevealing.  UDS negative.  EtOH level less than 10.  Right shoulder x-ray with no acute fracture or dislocation, fracture screws noted in the glenoid, as seen previously.  CT head without contrast negative for acute intracranial  abnormality.  CT angiogram head/neck with no emergent large vessel occlusion or high-grade stenosis of the cerebrovascular arteries.  MR brain without contrast with focus of acute/early subacute ischemia of the junction of the left precentral and postcentral gyri.  CT C-spine with mild spinal canal stenosis C4-5, C5-6 and C6/7, moderate bilateral C7 and mild right C5, C6 neuroforaminal stenosis.  Neurology was consulted.  TRH consulted for admission for further evaluation management of acute versus subacute CVA.  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  MRI brain without contrast with focus of acute/early subacute ischemia of the junction of the left precentral and postcentral gyri.  CT angiogram head/neck with no emergent large vessel occlusion or high-grade stenosis.  LDL 109, hemoglobin A1c 7.6.  TTE with LVEF 60 to 65%, no interatrial shunt.  Neurology was consulted and followed during hospital course.  Vascular duplex ultrasound bilateral lower extremities negative for DVT.  TCD bubble study weakly positive for a small right-to-left shunt likely clinically insignificant.  Seen by PT/OT with recommendation of outpatient PT.DAPT with aspirin 81 mg p.o. daily and Plavix 70 mg p.o. daily x 3 weeks followed by aspirin alone. Started on atorvastatin 40 mg p.o. daily.  30-day cardiac event monitor to be mailed to patient's home.  Neurology also recommended outpatient TEE, cardiology to set up outpatient appointment for further discussion.  Would benefit from outpatient sleep study.   Essential hypertension Not on antihypertensives outpatient, outpatient follow-up PCP.   Type 2 diabetes mellitus with hyperglycemia Hemoglobin A1c 7.6, not optimally controlled.  On metformin 500 mg p.o. twice daily outpatient.  Outpatient PCP.   Dyslipidemia  Lipid panel with total cholesterol 206, HDL 30, LDL 109, triglycerides 333.  Started on atorvastatin 40 mg p.o. daily   History of tobacco abuse Counseled on need for continued abstinence/cessation.  Obesity Body mass index is 33 kg/m.  Complicates all facets of care.  Discharge Diagnoses:  Principal Problem:   TIA (transient ischemic attack) Active Problems:   Elevated BP without diagnosis of hypertension    Discharge Instructions  Discharge Instructions     Ambulatory referral to Neurology   Complete by: As directed    An appointment is requested in approximately: 6 weeks   Ambulatory referral to Physical Therapy   Complete by: As directed    Call MD for:  difficulty breathing, headache or visual disturbances   Complete by: As directed    Call MD for:  extreme fatigue   Complete by: As directed    Call MD for:  persistant dizziness or light-headedness   Complete by: As directed    Call MD for:  persistant nausea and vomiting   Complete by: As directed    Call MD for:  severe uncontrolled pain   Complete by: As directed    Call MD for:  temperature >100.4   Complete by: As directed    Diet - low sodium heart healthy   Complete by: As directed    Increase activity slowly   Complete by: As directed       Allergies as of 07/08/2023   No Known Allergies      Medication List     TAKE these medications    aspirin EC 81 MG tablet Take 1 tablet (81 mg total) by mouth daily. Swallow whole. Start taking on: July 09, 2023   atorvastatin 40 MG tablet Commonly known as: LIPITOR Take 1 tablet (40 mg total) by mouth daily. Start taking on: July 09, 2023   clopidogrel 75 MG tablet Commonly known as: PLAVIX Take 1 tablet (75 mg total) by mouth daily for 21 days. Start taking on: July 09, 2023   metFORMIN 500 MG tablet Commonly known as: GLUCOPHAGE Take 1 tablet (500 mg total) by mouth 2 (two) times daily with a meal. 1-2 times per day as tolerated.        Follow-up Information     [facility].  Schedule an appointment as soon as possible for a visit.   Specialty: Rehabilitation Contact information: [address] [place] [address] [phone]        [name], MD. Schedule an appointment as soon as possible for a visit in 1 week(s).   Specialties: Family Medicine, Sports Medicine Contact information: [address] [place] [address] [phone]         [facility] Follow up in 6 week(s).   Specialty: Neurology Contact information: [address] [place] [address] [phone]         [facility] Follow up.   Why: 30-day cardiac event monitor, outpatient transesophageal echocardiogram as requested by neurology. The office will mail you a 30 day monitor to wear. It will come with instructions for how to mail back. The cardiology office will also call you with a new patient appointment to discuss a procedure called TEE. Contact information: [address] [place] [address] [phone]               No Known Allergies  Consultations: Neurology Cardiology for 30-day event monitor  and outpatient scheduling of TEE/appointment   Procedures/Studies: VAS [place] TRANSCRANIAL DOPPLER W BUBBLES  Result Date: 07/08/2023  Transcranial Doppler with Bubble Patient Name:  MARREO USELMAN  Date of Exam:   07/08/2023 Medical Rec #: [id number]     Accession #:    [id number] Date of Birth: 08/10/1975      Patient Gender: M Patient Age:   56 years Exam Location:  [facility] Procedure:      VAS [place] TRANSCRANIAL DOPPLER W BUBBLES Referring Phys: Hetty Blend --------------------------------------------------------------------------------  Indications: Stroke. Comparison Study: No prior studies. Performing Technologist: Chanda Busing RVT  Examination Guidelines: A complete evaluation includes B-mode imaging, spectral Doppler, color Doppler,  and power Doppler as needed of all accessible portions of each vessel. Bilateral testing is considered an integral part of a complete examination. Limited examinations for reoccurring indications may be performed as noted.  Summary:  A vascular evaluation was performed. The left middle cerebral artery was studied. An IV was inserted into the patient's left Basilic. Verbal informed consent was obtained.  Approximately 10 HITS detected. Grade 1 PFO. *See table(s) above for TCD measurements and observations.    Preliminary    VAS [place] LOWER EXTREMITY VENOUS (DVT)  Result Date: 07/08/2023  Lower Venous DVT Study Patient Name:  KITAI DEVINCENT  Date of Exam:   07/08/2023 Medical Rec #: [id number]     Accession #:    [id number] Date of Birth: 12-19-1975      Patient Gender: M Patient Age:   58 years Exam Location:  [facility] Procedure:      VAS [place] LOWER EXTREMITY VENOUS (DVT) Referring Phys: Hetty Blend --------------------------------------------------------------------------------  Indications: Stroke.  Risk Factors: None identified. Comparison Study: No prior studies. Performing Technologist: Chanda Busing RVT  Examination Guidelines: A complete evaluation includes B-mode imaging, spectral Doppler, color Doppler, and power Doppler as needed of all accessible portions of each vessel. Bilateral testing is considered an integral part of a complete examination. Limited examinations for reoccurring indications may be performed as noted. The reflux portion of the exam is performed with the patient in reverse Trendelenburg.  +---------+---------------+---------+-----------+----------+--------------+ RIGHT    CompressibilityPhasicitySpontaneityPropertiesThrombus Aging +---------+---------------+---------+-----------+----------+--------------+ CFV      Full           Yes      Yes                                 +---------+---------------+---------+-----------+----------+--------------+ SFJ      Full                                                         +---------+---------------+---------+-----------+----------+--------------+ FV Prox  Full                                                        +---------+---------------+---------+-----------+----------+--------------+ FV Mid   Full                                                        +---------+---------------+---------+-----------+----------+--------------+  FV DistalFull                                                        +---------+---------------+---------+-----------+----------+--------------+ PFV      Full                                                        +---------+---------------+---------+-----------+----------+--------------+ POP      Full           Yes      Yes                                 +---------+---------------+---------+-----------+----------+--------------+ PTV      Full                                                        +---------+---------------+---------+-----------+----------+--------------+ PERO     Full                                                        +---------+---------------+---------+-----------+----------+--------------+   +---------+---------------+---------+-----------+----------+--------------+ LEFT     CompressibilityPhasicitySpontaneityPropertiesThrombus Aging +---------+---------------+---------+-----------+----------+--------------+ CFV      Full           Yes      Yes                                 +---------+---------------+---------+-----------+----------+--------------+ SFJ      Full                                                        +---------+---------------+---------+-----------+----------+--------------+ FV Prox  Full                                                        +---------+---------------+---------+-----------+----------+--------------+ FV Mid   Full                                                         +---------+---------------+---------+-----------+----------+--------------+ FV DistalFull                                                        +---------+---------------+---------+-----------+----------+--------------+  PFV      Full                                                        +---------+---------------+---------+-----------+----------+--------------+ POP      Full           Yes      Yes                                 +---------+---------------+---------+-----------+----------+--------------+ PTV      Full                                                        +---------+---------------+---------+-----------+----------+--------------+ PERO     Full                                                        +---------+---------------+---------+-----------+----------+--------------+     Summary: RIGHT: - There is no evidence of deep vein thrombosis in the lower extremity.  - No cystic structure found in the popliteal fossa.  LEFT: - There is no evidence of deep vein thrombosis in the lower extremity.  - No cystic structure found in the popliteal fossa.  *See table(s) above for measurements and observations.    Preliminary    ECHOCARDIOGRAM COMPLETE BUBBLE STUDY  Result Date: 07/07/2023    ECHOCARDIOGRAM REPORT   Patient Name:   MARCANTHONY BODIN Date of Exam: 07/07/2023 Medical Rec #:  [id number]    Height:       70.0 in Accession #:    [id number]   Weight:       230.0 lb Date of Birth:  January 30, 1976     BSA:          2.215 m Patient Age:    47 years     BP:           123/88 mmHg Patient Gender: M            HR:           75 bpm. Exam Location:  Inpatient Procedure: 2D Echo, Cardiac Doppler, Color Doppler and Saline Contrast Bubble            Study Indications:    TIA 435.9/G45.9  History:        Patient has no prior history of Echocardiogram examinations.                 TIA.  Sonographer:    Lucendia Herrlich RCS Referring Phys: [phone] DEBBY CROSLEY IMPRESSIONS  1. Left  ventricular ejection fraction, by estimation, is 60 to 65%. The left ventricle has normal function. The left ventricle has no regional wall motion abnormalities. Left ventricular diastolic parameters are indeterminate.  2. Right ventricular systolic function is normal. The right ventricular size is normal.  3. The mitral valve is grossly normal. No evidence of mitral valve regurgitation.  4. The aortic valve is tricuspid. Aortic valve regurgitation is not  visualized. No aortic stenosis is present.  5. The inferior vena cava is normal in size with greater than 50% respiratory variability, suggesting right atrial pressure of 3 mmHg.  6. Agitated saline contrast bubble study was negative, with no evidence of any interatrial shunt. Comparison(s): No prior Echocardiogram. FINDINGS  Left Ventricle: Left ventricular ejection fraction, by estimation, is 60 to 65%. The left ventricle has normal function. The left ventricle has no regional wall motion abnormalities. The left ventricular internal cavity size was normal in size. There is  no left ventricular hypertrophy. Left ventricular diastolic parameters are indeterminate. Right Ventricle: The right ventricular size is normal. No increase in right ventricular wall thickness. Right ventricular systolic function is normal. Left Atrium: Left atrial size was normal in size. Right Atrium: Right atrial size was normal in size. Pericardium: Trivial pericardial effusion is present. The pericardial effusion is posterior to the left ventricle. Mitral Valve: The mitral valve is grossly normal. No evidence of mitral valve regurgitation. Tricuspid Valve: The tricuspid valve is normal in structure. Tricuspid valve regurgitation is not demonstrated. No evidence of tricuspid stenosis. Aortic Valve: The aortic valve is tricuspid. Aortic valve regurgitation is not visualized. No aortic stenosis is present. Aortic valve peak gradient measures 6.1 mmHg. Pulmonic Valve: The pulmonic valve was  normal in structure. Pulmonic valve regurgitation is not visualized. No evidence of pulmonic stenosis. Aorta: The aortic root and ascending aorta are structurally normal, with no evidence of dilitation. Venous: The inferior vena cava is normal in size with greater than 50% respiratory variability, suggesting right atrial pressure of 3 mmHg. IAS/Shunts: No atrial level shunt detected by color flow Doppler. Agitated saline contrast was given intravenously to evaluate for intracardiac shunting. Agitated saline contrast bubble study was negative, with no evidence of any interatrial shunt.  LEFT VENTRICLE PLAX 2D LVIDd:         4.70 cm   Diastology LVIDs:         3.00 cm   LV e' medial:    6.85 cm/s LV PW:         0.90 cm   LV E/e' medial:  8.9 LV IVS:        0.80 cm   LV e' lateral:   8.49 cm/s LVOT diam:     2.00 cm   LV E/e' lateral: 7.2 LV SV:         58 LV SV Index:   26 LVOT Area:     3.14 cm  RIGHT VENTRICLE             IVC RV S prime:     15.70 cm/s  IVC diam: 1.70 cm TAPSE (M-mode): 1.7 cm LEFT ATRIUM             Index        RIGHT ATRIUM           Index LA diam:        3.70 cm 1.67 cm/m   RA Area:     14.20 cm LA Vol (A2C):   35.7 ml 16.12 ml/m  RA Volume:   31.40 ml  14.18 ml/m LA Vol (A4C):   34.0 ml 15.35 ml/m LA Biplane Vol: 35.0 ml 15.80 ml/m  AORTIC VALVE AV Area (Vmax): 2.33 cm AV Vmax:        123.00 cm/s AV Peak Grad:   6.1 mmHg LVOT Vmax:      91.30 cm/s LVOT Vmean:     58.500 cm/s LVOT VTI:  0.186 m  AORTA Ao Root diam: 3.50 cm Ao Asc diam:  3.50 cm MITRAL VALVE               TRICUSPID VALVE MV Area (PHT): 3.37 cm    TR Peak grad:   5.1 mmHg MV Decel Time: 225 msec    TR Vmax:        113.00 cm/s MV E velocity: 61.10 cm/s MV A velocity: 79.10 cm/s  SHUNTS MV E/A ratio:  0.77        Systemic VTI:  0.19 m                            Systemic Diam: 2.00 cm Riley Lam MD Electronically signed by Riley Lam MD Signature Date/Time: 07/07/2023/12:40:37 PM    Final    EEG  adult  Result Date: 07/07/2023 Charlsie Quest, MD     07/07/2023  8:48 AM Patient Name: Yoshua Daughdrill MRN: [id number] Epilepsy Attending: Charlsie Quest Referring Physician/Provider: Erick Blinks, MD Date: 07/07/2023 Duration: 24.15 mins Patient history: 47 y.o. male who presents with episode of right arm weakness associated with some tremoring along with right arm and right leg numbness and weakness. EEG to evaluate for seizure Level of alertness: Awake, asleep AEDs during EEG study: None Technical aspects: This EEG study was done with scalp electrodes positioned according to the 10-20 International system of electrode placement. Electrical activity was reviewed with band pass filter of 1-70Hz , sensitivity of 7 uV/mm, display speed of 54mm/sec with a 60Hz  notched filter applied as appropriate. EEG data were recorded continuously and digitally stored.  Video monitoring was available and reviewed as appropriate. Description: The posterior dominant rhythm consists of 9 Hz activity of moderate voltage (25-35 uV) seen predominantly in posterior head regions, symmetric and reactive to eye opening and eye closing. Sleep was characterized by vertex waves, sleep spindles (12 to 14 Hz), maximal frontocentral region. Hyperventilation and photic stimulation were not performed.   IMPRESSION: This study is within normal limits. No seizures or epileptiform discharges were seen throughout the recording. A normal interictal EEG does not exclude the diagnosis of epilepsy. Charlsie Quest   MR BRAIN WO CONTRAST  Result Date: 07/07/2023 CLINICAL DATA:  Acute neurologic deficit. Right upper extremity tremor. EXAM: MRI HEAD WITHOUT CONTRAST MRI CERVICAL SPINE WITHOUT CONTRAST TECHNIQUE: Multiplanar, multiecho pulse sequences of the brain and surrounding structures, and cervical spine, to include the craniocervical junction and cervicothoracic junction, were obtained without intravenous contrast. Six sequences of the brain  are provided. COMPARISON:  03/12/2019 FINDINGS: MRI HEAD FINDINGS Brain: There is a focus of acute/early subacute ischemia at the junction of the left precentral and postcentral gyri. No chronic microhemorrhage or siderosis. Normal white matter signal, parenchymal volume and CSF spaces. The midline structures are normal. Vascular: Normal flow voids. Skull and upper cervical spine: Normal marrow signal. Sinuses/Orbits: Negative. Other: None. MRI CERVICAL SPINE FINDINGS Alignment: Physiologic. Vertebrae: No fracture, evidence of discitis, or bone lesion. Cord: Normal signal and morphology. Posterior Fossa, vertebral arteries, paraspinal tissues: Negative. Disc levels: C1-2: Unremarkable. C2-3: Normal disc space and facet joints. There is no spinal canal stenosis. No neural foraminal stenosis. C3-4: Normal disc space and facet joints. There is no spinal canal stenosis. No neural foraminal stenosis. C4-5: Small right subarticular disc protrusion with endplate osteophytes. Mild spinal canal stenosis. Mild right neural foraminal stenosis. C5-6: Unchanged intermediate sized right uncovertebral disc osteophyte complex. Mild spinal canal stenosis. Mild  right neural foraminal stenosis. C6-7: Unchanged intermediate size right subarticular/foraminal disc osteophyte complex with right foraminal disc extrusion with mild superior migration. Mild spinal canal stenosis. Moderate bilateral neural foraminal stenosis. C7-T1: Normal disc space and facet joints. There is no spinal canal stenosis. No neural foraminal stenosis. IMPRESSION: 1. Focus of acute/early subacute ischemia at the junction of the left precentral and postcentral gyri. 2. 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 3. Moderate bilateral C7 and mild right C5, C6 neural foraminal stenosis. Electronically Signed   By: Deatra Robinson M.D.   On: 07/07/2023 00:56   MR Cervical Spine Wo Contrast  Result Date: 07/07/2023 CLINICAL DATA:  Acute neurologic  deficit. Right upper extremity tremor. EXAM: MRI HEAD WITHOUT CONTRAST MRI CERVICAL SPINE WITHOUT CONTRAST TECHNIQUE: Multiplanar, multiecho pulse sequences of the brain and surrounding structures, and cervical spine, to include the craniocervical junction and cervicothoracic junction, were obtained without intravenous contrast. Six sequences of the brain are provided. COMPARISON:  03/12/2019 FINDINGS: MRI HEAD FINDINGS Brain: There is a focus of acute/early subacute ischemia at the junction of the left precentral and postcentral gyri. No chronic microhemorrhage or siderosis. Normal white matter signal, parenchymal volume and CSF spaces. The midline structures are normal. Vascular: Normal flow voids. Skull and upper cervical spine: Normal marrow signal. Sinuses/Orbits: Negative. Other: None. MRI CERVICAL SPINE FINDINGS Alignment: Physiologic. Vertebrae: No fracture, evidence of discitis, or bone lesion. Cord: Normal signal and morphology. Posterior Fossa, vertebral arteries, paraspinal tissues: Negative. Disc levels: C1-2: Unremarkable. C2-3: Normal disc space and facet joints. There is no spinal canal stenosis. No neural foraminal stenosis. C3-4: Normal disc space and facet joints. There is no spinal canal stenosis. No neural foraminal stenosis. C4-5: Small right subarticular disc protrusion with endplate osteophytes. Mild spinal canal stenosis. Mild right neural foraminal stenosis. C5-6: Unchanged intermediate sized right uncovertebral disc osteophyte complex. Mild spinal canal stenosis. Mild right neural foraminal stenosis. C6-7: Unchanged intermediate size right subarticular/foraminal disc osteophyte complex with right foraminal disc extrusion with mild superior migration. Mild spinal canal stenosis. Moderate bilateral neural foraminal stenosis. C7-T1: Normal disc space and facet joints. There is no spinal canal stenosis. No neural foraminal stenosis. IMPRESSION: 1. Focus of acute/early subacute ischemia at the  junction of the left precentral and postcentral gyri. 2. 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 3. Moderate bilateral C7 and mild right C5, C6 neural foraminal stenosis. Electronically Signed   By: Deatra Robinson M.D.   On: 07/07/2023 00:56   DG Shoulder Right Portable  Result Date: 07/06/2023 CLINICAL DATA:  Right shoulder pain.  Prior surgical repair. EXAM: RIGHT SHOULDER - 1 VIEW COMPARISON:  Right shoulder radiograph dated 06/27/2020. FINDINGS: Fractured screw in the glenoid as seen previously. There is no acute fracture or dislocation. Mild arthritic changes of the glenohumeral joint. The soft tissues are unremarkable. IMPRESSION: 1. No acute fracture or dislocation. 2. Fractured screw in the glenoid. Electronically Signed   By: Elgie Collard M.D.   On: 07/06/2023 22:29   CT Angio Head Neck W WO CM  Result Date: 07/06/2023 CLINICAL DATA:  Numbness.  Acute neurologic deficit. EXAM: CT ANGIOGRAPHY HEAD AND NECK WITH AND WITHOUT CONTRAST TECHNIQUE: Multidetector CT imaging of the head and neck was performed using the standard protocol during bolus administration of intravenous contrast. Multiplanar CT image reconstructions and MIPs were obtained to evaluate the vascular anatomy. Carotid stenosis measurements (when applicable) are obtained utilizing NASCET criteria, using the distal internal carotid diameter as the  denominator. RADIATION DOSE REDUCTION: This exam was performed according to the departmental dose-optimization program which includes automated exposure control, adjustment of the mA and/or kV according to patient size and/or use of iterative reconstruction technique. CONTRAST:  75mL OMNIPAQUE IOHEXOL 350 MG/ML SOLN COMPARISON:  None Available. FINDINGS: CTA NECK FINDINGS SKELETON: No acute abnormality or high grade bony spinal canal stenosis. OTHER NECK: Normal pharynx, larynx and major salivary glands. No cervical lymphadenopathy. Unremarkable thyroid gland. UPPER CHEST:  No pneumothorax or pleural effusion. No nodules or masses. AORTIC ARCH: There is no calcific atherosclerosis of the aortic arch. Conventional 3 vessel aortic branching pattern. RIGHT CAROTID SYSTEM: Normal without aneurysm, dissection or stenosis. LEFT CAROTID SYSTEM: Normal without aneurysm, dissection or stenosis. VERTEBRAL ARTERIES: Left dominant configuration. There is no dissection, occlusion or flow-limiting stenosis to the skull base (V1-V3 segments). CTA HEAD FINDINGS POSTERIOR CIRCULATION: --Vertebral arteries: Normal V4 segments. --Inferior cerebellar arteries: Normal. --Basilar artery: Normal. --Superior cerebellar arteries: Normal. --Posterior cerebral arteries (PCA): Normal. ANTERIOR CIRCULATION: --Intracranial internal carotid arteries: Normal. --Anterior cerebral arteries (ACA): Normal. --Middle cerebral arteries (MCA): Normal. VENOUS SINUSES: As permitted by contrast timing, patent. ANATOMIC VARIANTS: None Review of the MIP images confirms the above findings. IMPRESSION: No emergent large vessel occlusion or high-grade stenosis of the cerebrovascular arteries. Electronically Signed   By: Deatra Robinson M.D.   On: 07/06/2023 20:49   CT HEAD WO CONTRAST  Result Date: 07/06/2023 CLINICAL DATA:  Neuro deficit, persistent or recurrent, CNS neoplasm suspected. Numbness. EXAM: CT HEAD WITHOUT CONTRAST TECHNIQUE: Contiguous axial images were obtained from the base of the skull through the vertex without intravenous contrast. RADIATION DOSE REDUCTION: This exam was performed according to the departmental dose-optimization program which includes automated exposure control, adjustment of the mA and/or kV according to patient size and/or use of iterative reconstruction technique. COMPARISON:  02/26/2016 FINDINGS: Brain: No evidence of acute infarction, hemorrhage, hydrocephalus, extra-axial collection or mass lesion/mass effect. Vascular: No hyperdense vessel or unexpected calcification. Skull: Normal.  Negative for fracture or focal lesion. Calcification between the left occipital condyle and dense, possibly old avulsion injury. Sinuses/Orbits: No acute finding. IMPRESSION: Normal CT of the brain. Electronically Signed   By: Tiburcio Pea M.D.   On: 07/06/2023 18:52     Subjective: Patient seen examined bedside, sitting in bedside chair.  Reading a book.  Discharging home.  Discussed with neurology, Dr. Pearlean Brownie.  Weakness to right upper extremity relatively resolved, remains with mild right lower extremity weakness that is slowly improving.  Patient denies headache, no dizziness, no chest pain, no palpitations, no fever/chills/night sweats, no nausea/vomit/diarrhea, no fatigue, no paresthesia.  Discharge Exam: Vitals:   07/08/23 0804 07/08/23 1201  BP: 105/67 127/81  Pulse: (!) 58 61  Resp:  20  Temp: 98 F (36.7 C) 98.7 F (37.1 C)  SpO2: 100% 100%   Vitals:   07/07/23 2352 07/08/23 0428 07/08/23 0804 07/08/23 1201  BP: (!) 145/96 107/73 105/67 127/81  Pulse: 62 (!) 54 (!) 58 61  Resp:    20  Temp: 98 F (36.7 C) 98.1 F (36.7 C) 98 F (36.7 C) 98.7 F (37.1 C)  TempSrc: Oral Axillary Oral Oral  SpO2: 98% 95% 100% 100%  Weight:      Height:        Physical Exam: GEN: NAD, alert and oriented x 3, obese HEENT: NCAT, PERRL, EOMI, sclera clear, MMM PULM: CTAB w/o wheezes/crackles, normal respiratory effort, on room air CV: RRR w/o M/G/R GI: abd soft, NTND, NABS, no  R/G/M MSK: no peripheral edema, right lower extremity muscle strength slightly diminished 4/5, otherwise globally preserved throughout NEURO: CN II-XII intact, muscle strength right lower extremity slightly reduced as above, otherwise no other focal deficits, sensation to light touch intact PSYCH: normal mood/affect Integumentary: dry/intact, no rashes or wounds    The results of significant diagnostics from this hospitalization (including imaging, microbiology, ancillary and laboratory) are listed below for  reference.     Microbiology: No results found for this or any previous visit (from the past 240 hour(s)).   Labs: BNP (last 3 results) No results for input(s): "BNP" in the last 8760 hours. Basic Metabolic Panel: Recent Labs  Lab 07/06/23 1522 07/06/23 1535 07/07/23 0041  NA 135 138  --   K 3.8 3.8  --   CL 100 98  --   CO2 26  --   --   GLUCOSE 197* 197*  --   BUN 11 11  --   CREATININE 0.95 0.90 0.93  CALCIUM 9.1  --   --    Liver Function Tests: Recent Labs  Lab 07/06/23 1522  AST 27  ALT 44  ALKPHOS 46  BILITOT 0.9  PROT 6.7  ALBUMIN 3.9   No results for input(s): "LIPASE", "AMYLASE" in the last 168 hours. No results for input(s): "AMMONIA" in the last 168 hours. CBC: Recent Labs  Lab 07/06/23 1522 07/06/23 1535 07/07/23 0041 07/07/23 0640  WBC 6.3  --  7.4 6.2  NEUTROABS 3.5  --   --   --   HGB 14.7 15.0 13.3 13.7  HCT 44.0 44.0 40.3 42.4  MCV 78.6*  --  77.4* 77.7*  PLT 214  --  187 192   Cardiac Enzymes: No results for input(s): "CKTOTAL", "CKMB", "CKMBINDEX", "TROPONINI" in the last 168 hours. BNP: Invalid input(s): "POCBNP" CBG: Recent Labs  Lab 07/07/23 1323 07/07/23 1640 07/07/23 2125 07/08/23 0637 07/08/23 1158  GLUCAP 143* 124* 249* 120* 157*   D-Dimer No results for input(s): "DDIMER" in the last 72 hours. Hgb A1c No results for input(s): "HGBA1C" in the last 72 hours. Lipid Profile Recent Labs    07/07/23 0640  CHOL 206*  HDL 30*  LDLCALC 109*  TRIG 333*  CHOLHDL 6.9   Thyroid function studies No results for input(s): "TSH", "T4TOTAL", "T3FREE", "THYROIDAB" in the last 72 hours.  Invalid input(s): "FREET3" Anemia work up No results for input(s): "VITAMINB12", "FOLATE", "FERRITIN", "TIBC", "IRON", "RETICCTPCT" in the last 72 hours. Urinalysis    Component Value Date/Time   COLORURINE YELLOW 07/06/2023 1515   APPEARANCEUR CLEAR 07/06/2023 1515   LABSPEC 1.010 07/06/2023 1515   PHURINE 6.0 07/06/2023 1515    GLUCOSEU NEGATIVE 07/06/2023 1515   HGBUR NEGATIVE 07/06/2023 1515   BILIRUBINUR NEGATIVE 07/06/2023 1515   BILIRUBINUR neg 09/23/2014 1032   KETONESUR NEGATIVE 07/06/2023 1515   PROTEINUR NEGATIVE 07/06/2023 1515   UROBILINOGEN 0.2 09/23/2014 1032   NITRITE NEGATIVE 07/06/2023 1515   LEUKOCYTESUR NEGATIVE 07/06/2023 1515   Sepsis Labs Recent Labs  Lab 07/06/23 1522 07/07/23 0041 07/07/23 0640  WBC 6.3 7.4 6.2   Microbiology No results found for this or any previous visit (from the past 240 hour(s)).   Time coordinating discharge: Over 30 minutes  SIGNED:   Alvira Philips [place], DO  [facility] 07/08/2023, 2:17 PM

## 2023-07-08 NOTE — Telephone Encounter (Addendum)
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 Received request from Dr. [place] regarding need for 30 day monitor at discharge today for stroke. He will notify patient this will be mailed to his house. I also outlined this on AVS. Will route to monitor team to arrange (will assign to Dr. Allyson Sabal, DOD today per our protocol).  The neuro team also requested to arrange outpatient TEE. Per protocol will need new patient appointment to discuss. I have sent a separate staff message to our scheduling team to arrange. I asked them to call patient with appt info.

## 2023-07-08 NOTE — Progress Notes (Addendum)
STROKE TEAM PROGRESS NOTE   BRIEF HPI [name] is a 47 y.o. male with PMH significant for DM, hypertension, hyperlipidemia, former smoker, asthma who presented 12/2 with episode of right arm weakness, tremoring, right arm and right leg numbness.  Patient did drive himself to the ED.  He also reported repeating very anxious at this time. MRI showed acute/early subacute ischemia at left precentral and postcentral gyri junction.  C-spine MRI showed bilateral mild spinal canal and foraminal stenosis.  NIH on Admission: 2  INTERIM HISTORY/SUBJECTIVE Patient states right-sided strength is improved but he still has some paresthesias in the right hip and thigh No family at the bedside.  No new neurological events overnight Recommend outpatient TEE and 30-day monitoring. Recommend aspirin 81 mg and Plavix 75 mg daily for 3 weeks and aspirin alone  Ultrasound of lower extremities is ordered, TCD with bubble is also ordered  OBJECTIVE  CBC    Component Value Date/Time   WBC 6.2 07/07/2023 0640   RBC 5.46 07/07/2023 0640   HGB 13.7 07/07/2023 0640   HCT 42.4 07/07/2023 0640   PLT 192 07/07/2023 0640   MCV 77.7 (L) 07/07/2023 0640   MCV 77.8 (A) 04/02/2016 1423   MCH 25.1 (L) 07/07/2023 0640   MCHC 32.3 07/07/2023 0640   RDW 13.1 07/07/2023 0640   LYMPHSABS 2.3 07/06/2023 1522   MONOABS 0.4 07/06/2023 1522   EOSABS 0.1 07/06/2023 1522   BASOSABS 0.0 07/06/2023 1522    BMET    Component Value Date/Time   NA 138 07/06/2023 1535   NA 140 10/12/2020 1007   K 3.8 07/06/2023 1535   CL 98 07/06/2023 1535   CO2 26 07/06/2023 1522   GLUCOSE 197 (H) 07/06/2023 1535   BUN 11 07/06/2023 1535   BUN 13 10/12/2020 1007   CREATININE 0.93 07/07/2023 0041   CREATININE 0.97 04/02/2016 1410   CALCIUM 9.1 07/06/2023 1522   EGFR 98 10/12/2020 1007   GFRNONAA >60 07/07/2023 0041   GFRNONAA >89 04/02/2016 1410    IMAGING past 24 hours No results found.  Vitals:   07/07/23 2352 07/08/23  0428 07/08/23 0804 07/08/23 1201  BP: (!) 145/96 107/73 105/67 127/81  Pulse: 62 (!) 54 (!) 58 61  Resp:    20  Temp: 98 F (36.7 C) 98.1 F (36.7 C) 98 F (36.7 C) 98.7 F (37.1 C)  TempSrc: Oral Axillary Oral Oral  SpO2: 98% 95% 100% 100%  Weight:      Height:        PHYSICAL EXAM General:  Alert, well-nourished, well-developed [place] origin male in no acute distress Psych:  Mood and affect appropriate for situation CV: Regular rate and rhythm on monitor Respiratory:  Regular, unlabored respirations on room air GI: Abdomen soft and nontender  NEURO:  Mental Status: AA&Ox3, patient is able to give clear and coherent history Speech/Language: speech is without dysarthria or aphasia.  Naming, repetition, fluency, and comprehension intact.  Cranial Nerves:  II: PERRL. Visual fields full.  III, IV, VI: EOMI. Eyelids elevate symmetrically.  V: Sensation is intact to light touch and symmetrical to face.  VII: Face is symmetrical resting and smiling VIII: hearing intact to voice. IX, X: Palate elevates symmetrically. Phonation is normal.  WU:[id number] shrug 5/5. XII: tongue is midline without fasciculations. Motor: 5/5 strength to major muscle groups.  Decreased right grip, but good finger strength.  Diminished fine finger movements on the right.  Orbits left or right upper extremity Tone: is  normal and bulk is normal Sensation-intermittent numbness noted to right hand Coordination: FTN intact bilaterally, HKS: no ataxia in BLE.No drift.  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  MRI    Focus of acute/early subacute ischemia at the junction of the left precentral and postcentral gyri. 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  Moderate bilateral C7 and mild right C5, C6 neural foraminal stenosis.  VAS [place] LE DVT: Negative for DVT VAS [place] TCD w/ Bubble Study: Positive for small right-to-left shunt likely clinically insignificant 2D Echo: LVEF EF 60 to 65%, no shunt. Recommend TEE as outpatient Recommend 30-day heart monitor  LDL 109 HgbA1c 7.6 VTE prophylaxis -Lovenox No antithrombotic prior to admission, now on aspirin 81 mg daily and clopidogrel 75 mg daily for 3 weeks and then aspirin alone. Therapy recommendations:  Pending Disposition:  pending  Hypertension Home meds:  none Stable Blood Pressure Goal: BP less than 220/110 for first 24 to 48 hours and gradually normalize  Hyperlipidemia Home meds:  none LDL 109, goal < 70 Continue Lipitor 40 mg Continue statin at discharge  Diabetes type II Uncontrolled Home meds:  Metformin 500 mg twice daily HgbA1c 7.6, goal < 7.0 CBGs SSI Recommend close follow-up with PCP for better DM control  Tobacco Abuse Former cigarette smoker  Other Stroke Risk Factors ETOH use, advised to drink no more than 1-2 drink(s) a day Obesity, Body mass index is 33 kg/m., BMI >/= 30 associated with increased stroke risk, recommend weight loss, diet and exercise as appropriate  Substance use? : UDS pending Family hx stroke (mother)  Hospital day # 0  Pt seen by Neuro NP/APP and later by MD. Note/plan to be edited by MD as needed.     Gevena Mart DNP, ACNPC-AG  [facility]  I have personally obtained history,examined this patient, reviewed notes, independently viewed imaging studies, participated in medical decision making and plan of care.ROS completed by me personally and pertinent positives fully documented  I have made any additions or clarifications directly to the above note. Agree with  note above.  Patient continues to improve but still has some residual paresthesias.  TCD bubble study weakly positive for a small right-to-left shunt likely clinically insignificant.  Will check outpatient TEE and 30-day heart monitor.  Discharged home on aspirin and Plavix for 3 weeks followed by aspirin alone and statin and aggressive risk factor modification.  Will also need outpatient evaluation for obstructive sleep apnea polysomnogram.  Follow-up with an outpatient stroke clinic in 2 months.  Long discussion with patient and wife and answered questions.  Discussed with Dr. [place].  Greater then 50% time during this 35-minute visit was spent in counseling and coordination of care about his cryptogenic stroke and discussion about stroke evaluation, prevention and treatment and answering questions.  Delia Heady, MD Medical Director [facility] Pager: [phone] 07/08/2023 1:50 PM   To contact Stroke Continuity provider, please refer to [email or web address]. After hours, contact General Neurology

## 2023-07-08 NOTE — Addendum Note (Signed)
Addended by: Laurann Montana on: 07/08/2023 02:51 PM   Modules accepted: Orders

## 2023-07-08 NOTE — Plan of Care (Signed)
  Problem: Ischemic Stroke/TIA Tissue Perfusion: Goal: Complications of ischemic stroke/TIA will be minimized Outcome: Not Progressing   Problem: Safety: Goal: Ability to remain free from injury will improve Outcome: Not Progressing   Problem: Skin Integrity: Goal: Risk for impaired skin integrity will decrease Outcome: Not Progressing   Problem: Nutrition: Goal: Adequate nutrition will be maintained Outcome: Not Progressing

## 2023-07-08 NOTE — Addendum Note (Signed)
Addended by: Laurann Montana on: 07/08/2023 02:21 PM   Modules accepted: Orders

## 2023-07-08 NOTE — TOC Transition Note (Signed)
Transition of Care [facility]) - CM/SW Discharge Note   Patient Details  Name: Brian Reid MRN: [id number] Date of Birth: 11-06-1975  [facility] CM/SW Contact:  Kermit Balo, RN Phone Number: 07/08/2023, 2:14 PM   Clinical Narrative:     Pt is from home with his spouse. Outpatient therapy arranged with [facility]. Information on the AVS and pt will call to schedule the first appointment.  No DME needs. He does have a walker at home. No transportation issues and he denies issues with medications at home. Spouse will transport home.  Final next level of care: OP Rehab Barriers to Discharge: No Barriers Identified   Patient Goals and CMS Choice      Discharge Placement                         Discharge Plan and Services Additional resources added to the After Visit Summary for                                       Social Determinants of Health (SDOH) Interventions SDOH Screenings   Food Insecurity: No Food Insecurity (07/07/2023)  Housing: Low Risk  (07/07/2023)  Transportation Needs: No Transportation Needs (07/07/2023)  Utilities: Not At Risk (07/07/2023)  Alcohol Screen: Low Risk  (06/04/2023)  Depression (PHQ2-9): Low Risk  (06/04/2023)  Financial Resource Strain: Low Risk  (06/04/2023)  Physical Activity: Sufficiently Active (06/04/2023)  Social Connections: Socially Integrated (06/04/2023)  Stress: No Stress Concern Present (06/04/2023)  Tobacco Use: Medium Risk (07/06/2023)     Readmission Risk Interventions     No data to display

## 2023-07-09 ENCOUNTER — Telehealth: Payer: Self-pay

## 2023-07-09 NOTE — H&P (View-Only) (Signed)
Cardiology Office Note   Date:  07/10/2023   ID:  Brian Reid, DOB 1976-01-21, MRN [id number]  PCP:  [name], MD  Cardiologist:   Rollene Rotunda, MD Referring:  [name], MD  Chief Complaint  Patient presents with   Cerebrovascular Accident      History of Present Illness: Brian Reid is a 47 y.o. male who presents for evaluation after stroke.  He was in the emergency room with right-sided weakness on December 2.  I reviewed these records.  MRI demonstrated a focus of acute or early subacute ischemia at the junction of the left precentral and postcentral gyri.  Transthoracic echo demonstrated EF of 60 to 65% with a positive bubble study positive for small right-to-left shunt.  He was sent home with aspirin and Plavix.  TEE was suggested.  It was also thought that he would benefit from eventual sleep study.  He has diabetes with an A1c of 7.6.  He has some dyslipidemia with hypertriglyceridemia.  Blood pressure has been elevated multiple readings.  He has some residual right-sided weakness.  He has not had any visual or speech changes.  He denies any chest pressure, neck or arm discomfort.  He has no shortness of breath, PND or orthopnea.  He has had no weight gain or edema.  He works in Consulting civil engineer.  He does some weightlifting for exercise.   Past Medical History:  Diagnosis Date   Asthma     Past Surgical History:  Procedure Laterality Date   APPENDECTOMY N/A    [employer] 06/27/2020   LAPAROSCOPIC APPENDECTOMY N/A 04/02/2016   Procedure: APPENDECTOMY LAPAROSCOPIC;  Surgeon: Chevis Pretty III, MD;  Location: [facility];  Service: General;  Laterality: N/A;   LEG SURGERY     age 52   OPEN REDUCTION SHOULDER DISLOCATION     age 57     Current Outpatient Medications  Medication Sig Dispense Refill   amLODipine (NORVASC) 2.5 MG tablet Take 1 tablet (2.5 mg total) by mouth daily. 180 tablet 3   aspirin EC 81 MG tablet Take 1 tablet (81 mg total) by mouth daily. Swallow  whole. 90 tablet 0   atorvastatin (LIPITOR) 40 MG tablet Take 1 tablet (40 mg total) by mouth daily. 90 tablet 0   clopidogrel (PLAVIX) 75 MG tablet Take 1 tablet (75 mg total) by mouth daily for 21 days. 21 tablet 0   metFORMIN (GLUCOPHAGE) 500 MG tablet Take 1 tablet (500 mg total) by mouth 2 (two) times daily with a meal. 1-2 times per day as tolerated. 180 tablet 1   No current facility-administered medications for this visit.    Allergies:   Patient has no known allergies.    Social History:  The patient  reports that he has quit smoking. He has never used smokeless tobacco. He reports current alcohol use of about 2.0 standard drinks of alcohol per week. He reports that he does not use drugs.   Family History:  The patient's family history includes Diabetes in his father and mother; Heart disease (age of onset: 64) in his father; Hypertension in his mother and sister; Stroke (age of onset: 2) in his mother.    ROS:  Please see the history of present illness.   Otherwise, review of systems are positive for none.   All other systems are reviewed and negative.    PHYSICAL EXAM: VS:  BP (!) 150/100 (BP Location: Left Arm, Patient Position: Sitting, Cuff Size: Large)   Pulse  61   Ht 5\' 10"  (1.778 m)   Wt 229 lb 12.8 oz (104.2 kg)   SpO2 98%   BMI 32.97 kg/m  , BMI Body mass index is 32.97 kg/m. GENERAL:  Well appearing HEENT:  Pupils equal round and reactive, fundi not visualized, oral mucosa unremarkable NECK:  No jugular venous distention, waveform within normal limits, carotid upstroke brisk and symmetric, no bruits, no thyromegaly LYMPHATICS:  No cervical, inguinal adenopathy LUNGS:  Clear to auscultation bilaterally BACK:  No CVA tenderness CHEST:  Unremarkable HEART:  PMI not displaced or sustained,S1 and S2 within normal limits, no S3, no S4, no clicks, no rubs, no murmurs ABD:  Flat, positive bowel sounds normal in frequency in pitch, no bruits, no rebound, no guarding,  no midline pulsatile mass, no hepatomegaly, no splenomegaly EXT:  2 plus pulses throughout, no edema, no cyanosis no clubbing SKIN:  No rashes no nodules NEURO:  Cranial nerves II through XII grossly intact, motor grossly intact throughout [facility]:  Cognitively intact, oriented to person place and time    EKG:    July 16, 2023 rate 67, rightward axis, intervals within normal limits, no acute ST-T wave changes.    Recent Labs: 07/06/2023: ALT 44; BUN 11; Potassium 3.8; Sodium 138 07/07/2023: Creatinine, Ser 0.93; Hemoglobin 13.7; Platelets 192    Lipid Panel    Component Value Date/Time   CHOL 206 (H) 07/07/2023 0640   CHOL 226 (H) 10/12/2020 1007   TRIG 333 (H) 07/07/2023 0640   HDL 30 (L) 07/07/2023 0640   HDL 37 (L) 10/12/2020 1007   CHOLHDL 6.9 07/07/2023 0640   VLDL 67 (H) 07/07/2023 0640   LDLCALC 109 (H) 07/07/2023 0640   LDLCALC 161 (H) 10/12/2020 1007   LDLDIRECT 119.0 03/20/2023 1140      Wt Readings from Last 3 Encounters:  07/10/23 229 lb 12.8 oz (104.2 kg)  07/06/23 230 lb (104.3 kg)  06/04/23 229 lb 9.6 oz (104.1 kg)      Other studies Reviewed: Additional studies/ records that were reviewed today include: Hospital records. Review of the above records demonstrates:  Please see elsewhere in the note.     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  However, embolic source needs to be excluded and so he will need a TEE.  Risks and benefits of transesophageal echocardiogram have been explained including risks of esophageal damage, perforation (1:10,000 risk), bleeding, pharyngeal hematoma as well as other potential complications associated with conscious sedation including aspiration, arrhythmia, respiratory failure and death. Alternatives to treatment were discussed, questions were answered. Patient is willing to proceed.    Essential hypertension: His blood  pressure is not at target.  I am going to add amlodipine 2.5 mg daily.   Type 2 diabetes mellitus with hyperglycemia:   Obesity: This lifestyle changes discussed with.   Current medicines are reviewed at length with the patient today.  The patient does not have concerns regarding medicines.  The following changes have been made:  As above  Labs/ tests ordered today include:   No orders of the defined types were placed in this encounter.    Disposition:   FU with APP in 3 months.      Signed, Rollene Rotunda, MD  07/10/2023 11:05 AM    [facility]

## 2023-07-09 NOTE — Transitions of Care (Post Inpatient/ED Visit) (Signed)
   07/09/2023  Name: Brian Reid MRN: [id number] DOB: Dec 18, 1975  Today's TOC FU Call Status: Today's TOC FU Call Status:: Successful TOC FU Call Completed TOC FU Call Complete Date: 07/09/23 Patient's Name and Date of Birth confirmed.  Transition Care Management Follow-up Telephone Call Date of Discharge: 07/08/23 Discharge Facility: [facility]) Type of Discharge: Emergency Department Reason for ED Visit: Other: (cerebral infarction) How have you been since you were released from the hospital?: Better Any questions or concerns?: No  Items Reviewed: Did you receive and understand the discharge instructions provided?: Yes Medications obtained,verified, and reconciled?: Yes (Medications Reviewed) Any new allergies since your discharge?: No Dietary orders reviewed?: Yes Do you have support at home?: Yes People in Home: spouse  Medications Reviewed Today: Medications Reviewed Today     Reviewed by Karena Addison, LPN (Licensed Practical Nurse) on 07/09/23 at 1509  Med List Status: <None>   Medication Order Taking? Sig Documenting Provider Last Dose Status Informant  aspirin EC 81 MG tablet [id number]  Take 1 tablet (81 mg total) by mouth daily. Swallow whole. [place], Alvira Philips, DO  Active   atorvastatin (LIPITOR) 40 MG tablet [id number]  Take 1 tablet (40 mg total) by mouth daily. [place], Alvira Philips, DO  Active   clopidogrel (PLAVIX) 75 MG tablet [id number]  Take 1 tablet (75 mg total) by mouth daily for 21 days. [place], Alvira Philips, DO  Active   metFORMIN (GLUCOPHAGE) 500 MG tablet [id number] No Take 1 tablet (500 mg total) by mouth 2 (two) times daily with a meal. 1-2 times per day as tolerated. [name], MD 07/06/2023 Active Self, Pharmacy Records            Home Care and Equipment/Supplies: Were Home Health Services Ordered?: NA Any new equipment or medical supplies ordered?: NA  Functional Questionnaire: Do you need assistance with bathing/showering or dressing?:  No Do you need assistance with meal preparation?: No Do you need assistance with eating?: No Do you have difficulty maintaining continence: No Do you need assistance with getting out of bed/getting out of a chair/moving?: No Do you have difficulty managing or taking your medications?: No  Follow up appointments reviewed: PCP Follow-up appointment confirmed?: Yes Date of PCP follow-up appointment?: 07/20/23 Follow-up Provider: [facility] Follow-up appointment confirmed?: Yes Date of Specialist follow-up appointment?: 07/10/23 Follow-Up Specialty Provider:: cardio Do you need transportation to your follow-up appointment?: No Do you understand care options if your condition(s) worsen?: Yes-patient verbalized understanding    SIGNATURE Karena Addison, LPN [facility] Nurse Health Advisor Direct Dial [phone]

## 2023-07-09 NOTE — Progress Notes (Signed)
Cardiology Office Note   Date:  07/10/2023   ID:  Brian Reid, DOB 1976-01-21, MRN [id number]  PCP:  [name], MD  Cardiologist:   Rollene Rotunda, MD Referring:  [name], MD  Chief Complaint  Patient presents with   Cerebrovascular Accident      History of Present Illness: Brian Reid is a 47 y.o. male who presents for evaluation after stroke.  He was in the emergency room with right-sided weakness on December 2.  I reviewed these records.  MRI demonstrated a focus of acute or early subacute ischemia at the junction of the left precentral and postcentral gyri.  Transthoracic echo demonstrated EF of 60 to 65% with a positive bubble study positive for small right-to-left shunt.  He was sent home with aspirin and Plavix.  TEE was suggested.  It was also thought that he would benefit from eventual sleep study.  He has diabetes with an A1c of 7.6.  He has some dyslipidemia with hypertriglyceridemia.  Blood pressure has been elevated multiple readings.  He has some residual right-sided weakness.  He has not had any visual or speech changes.  He denies any chest pressure, neck or arm discomfort.  He has no shortness of breath, PND or orthopnea.  He has had no weight gain or edema.  He works in Consulting civil engineer.  He does some weightlifting for exercise.   Past Medical History:  Diagnosis Date   Asthma     Past Surgical History:  Procedure Laterality Date   APPENDECTOMY N/A    Phreesia 06/27/2020   LAPAROSCOPIC APPENDECTOMY N/A 04/02/2016   Procedure: APPENDECTOMY LAPAROSCOPIC;  Surgeon: Chevis Pretty III, MD;  Location: [facility];  Service: General;  Laterality: N/A;   LEG SURGERY     age 52   OPEN REDUCTION SHOULDER DISLOCATION     age 57     Current Outpatient Medications  Medication Sig Dispense Refill   amLODipine (NORVASC) 2.5 MG tablet Take 1 tablet (2.5 mg total) by mouth daily. 180 tablet 3   aspirin EC 81 MG tablet Take 1 tablet (81 mg total) by mouth daily. Swallow  whole. 90 tablet 0   atorvastatin (LIPITOR) 40 MG tablet Take 1 tablet (40 mg total) by mouth daily. 90 tablet 0   clopidogrel (PLAVIX) 75 MG tablet Take 1 tablet (75 mg total) by mouth daily for 21 days. 21 tablet 0   metFORMIN (GLUCOPHAGE) 500 MG tablet Take 1 tablet (500 mg total) by mouth 2 (two) times daily with a meal. 1-2 times per day as tolerated. 180 tablet 1   No current facility-administered medications for this visit.    Allergies:   Patient has no known allergies.    Social History:  The patient  reports that he has quit smoking. He has never used smokeless tobacco. He reports current alcohol use of about 2.0 standard drinks of alcohol per week. He reports that he does not use drugs.   Family History:  The patient's family history includes Diabetes in his father and mother; Heart disease (age of onset: 64) in his father; Hypertension in his mother and sister; Stroke (age of onset: 2) in his mother.    ROS:  Please see the history of present illness.   Otherwise, review of systems are positive for none.   All other systems are reviewed and negative.    PHYSICAL EXAM: VS:  BP (!) 150/100 (BP Location: Left Arm, Patient Position: Sitting, Cuff Size: Large)   Pulse  61   Ht 5\' 10"  (1.778 m)   Wt 229 lb 12.8 oz (104.2 kg)   SpO2 98%   BMI 32.97 kg/m  , BMI Body mass index is 32.97 kg/m. GENERAL:  Well appearing HEENT:  Pupils equal round and reactive, fundi not visualized, oral mucosa unremarkable NECK:  No jugular venous distention, waveform within normal limits, carotid upstroke brisk and symmetric, no bruits, no thyromegaly LYMPHATICS:  No cervical, inguinal adenopathy LUNGS:  Clear to auscultation bilaterally BACK:  No CVA tenderness CHEST:  Unremarkable HEART:  PMI not displaced or sustained,S1 and S2 within normal limits, no S3, no S4, no clicks, no rubs, no murmurs ABD:  Flat, positive bowel sounds normal in frequency in pitch, no bruits, no rebound, no guarding,  no midline pulsatile mass, no hepatomegaly, no splenomegaly EXT:  2 plus pulses throughout, no edema, no cyanosis no clubbing SKIN:  No rashes no nodules NEURO:  Cranial nerves II through XII grossly intact, motor grossly intact throughout [facility]:  Cognitively intact, oriented to person place and time    EKG:    July 16, 2023 rate 67, rightward axis, intervals within normal limits, no acute ST-T wave changes.    Recent Labs: 07/06/2023: ALT 44; BUN 11; Potassium 3.8; Sodium 138 07/07/2023: Creatinine, Ser 0.93; Hemoglobin 13.7; Platelets 192    Lipid Panel    Component Value Date/Time   CHOL 206 (H) 07/07/2023 0640   CHOL 226 (H) 10/12/2020 1007   TRIG 333 (H) 07/07/2023 0640   HDL 30 (L) 07/07/2023 0640   HDL 37 (L) 10/12/2020 1007   CHOLHDL 6.9 07/07/2023 0640   VLDL 67 (H) 07/07/2023 0640   LDLCALC 109 (H) 07/07/2023 0640   LDLCALC 161 (H) 10/12/2020 1007   LDLDIRECT 119.0 03/20/2023 1140      Wt Readings from Last 3 Encounters:  07/10/23 229 lb 12.8 oz (104.2 kg)  07/06/23 230 lb (104.3 kg)  06/04/23 229 lb 9.6 oz (104.1 kg)      Other studies Reviewed: Additional studies/ records that were reviewed today include: Hospital records. Review of the above records demonstrates:  Please see elsewhere in the note.     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  However, embolic source needs to be excluded and so he will need a TEE.  Risks and benefits of transesophageal echocardiogram have been explained including risks of esophageal damage, perforation (1:10,000 risk), bleeding, pharyngeal hematoma as well as other potential complications associated with conscious sedation including aspiration, arrhythmia, respiratory failure and death. Alternatives to treatment were discussed, questions were answered. Patient is willing to proceed.    Essential hypertension: His blood  pressure is not at target.  I am going to add amlodipine 2.5 mg daily.   Type 2 diabetes mellitus with hyperglycemia:   Obesity: This lifestyle changes discussed with.   Current medicines are reviewed at length with the patient today.  The patient does not have concerns regarding medicines.  The following changes have been made:  As above  Labs/ tests ordered today include:   No orders of the defined types were placed in this encounter.    Disposition:   FU with APP in 3 months.      Signed, Rollene Rotunda, MD  07/10/2023 11:05 AM    [facility]

## 2023-07-10 ENCOUNTER — Encounter: Payer: Self-pay | Admitting: Cardiology

## 2023-07-10 ENCOUNTER — Ambulatory Visit: Payer: Managed Care, Other (non HMO) | Attending: Internal Medicine

## 2023-07-10 ENCOUNTER — Other Ambulatory Visit: Payer: Self-pay

## 2023-07-10 ENCOUNTER — Ambulatory Visit: Payer: Managed Care, Other (non HMO) | Attending: Cardiology | Admitting: Cardiology

## 2023-07-10 VITALS — BP 150/100 | HR 61 | Ht 70.0 in | Wt 229.8 lb

## 2023-07-10 DIAGNOSIS — E118 Type 2 diabetes mellitus with unspecified complications: Secondary | ICD-10-CM | POA: Diagnosis not present

## 2023-07-10 DIAGNOSIS — I639 Cerebral infarction, unspecified: Secondary | ICD-10-CM

## 2023-07-10 DIAGNOSIS — E785 Hyperlipidemia, unspecified: Secondary | ICD-10-CM | POA: Diagnosis not present

## 2023-07-10 DIAGNOSIS — I10 Essential (primary) hypertension: Secondary | ICD-10-CM | POA: Diagnosis not present

## 2023-07-10 DIAGNOSIS — M6281 Muscle weakness (generalized): Secondary | ICD-10-CM | POA: Insufficient documentation

## 2023-07-10 DIAGNOSIS — R29818 Other symptoms and signs involving the nervous system: Secondary | ICD-10-CM | POA: Insufficient documentation

## 2023-07-10 DIAGNOSIS — Z720 Tobacco use: Secondary | ICD-10-CM

## 2023-07-10 MED ORDER — AMLODIPINE BESYLATE 2.5 MG PO TABS: 2.5000 mg | ORAL_TABLET | Freq: Every day | ORAL | 3 refills | Status: DC

## 2023-07-10 NOTE — Therapy (Signed)
OUTPATIENT PHYSICAL THERAPY NEURO EVALUATION   Patient Name: Brian Reid MRN: [id number] DOB:[birth year], 47 y.o., male Today's Date: 07/10/2023   PCP: [name], MD REFERRING PROVIDER: I63.9 (ICD-10-CM) - Cerebrovascular accident (CVA), unspecified mechanism [facility])  END OF SESSION:  PT End of Session - 07/10/23 0805     Visit Number 1    Authorization Type Cigna/Cigna Managed    PT Start Time 0803    PT Stop Time 0845    PT Time Calculation (min) 42 min             Past Medical History:  Diagnosis Date   Asthma    Past Surgical History:  Procedure Laterality Date   APPENDECTOMY N/A    Phreesia 06/27/2020   LAPAROSCOPIC APPENDECTOMY N/A 04/02/2016   Procedure: APPENDECTOMY LAPAROSCOPIC;  Surgeon: Chevis Pretty III, MD;  Location: [facility];  Service: General;  Laterality: N/A;   LEG SURGERY     age 71   OPEN REDUCTION SHOULDER DISLOCATION     age 47   Patient Active Problem List   Diagnosis Date Noted   TIA (transient ischemic attack) 07/06/2023   Elevated BP without diagnosis of hypertension 07/06/2023   Pain in left hand 09/09/2021   Left forearm pain 04/12/2021   Acute appendicitis s/p lap appendectomy 04/02/2016 04/03/2016    ONSET DATE: 07/06/23  REFERRING DIAG: I63.9 (ICD-10-CM) - Cerebrovascular accident (CVA), unspecified mechanism (HCC)  THERAPY DIAG:  No diagnosis found.  Rationale for Evaluation and Treatment: Rehabilitation  SUBJECTIVE:                                                                                                                                                                                             SUBJECTIVE STATEMENT: Sudden onset CVA 07/06/23 with subsequent right side weakness.  Notes brief effect to RUE but now primary issue is sensory disturbance to right thorax to right anterior knee. Denies any signs of aspiration or dysphagia.  At time of incident was experiencing high blood pressure but is currently not treated for  it. Meets with PCP for hospital D/C f/u next week. Denies HA, blurred vision, dizziness Pt accompanied by: significant other  PERTINENT HISTORY: MRI brain without contrast with focus of acute/early subacute ischemia of the junction of the left precentral and postcentral gyri.  CT angiogram head/neck with no emergent large vessel occlusion or high-grade stenosis.  LDL 109, hemoglobin A1c 7.6.  TTE with LVEF 60 to 65%, no interatrial shunt.  Neurology was consulted and followed during hospital course.  Vascular duplex ultrasound bilateral lower extremities negative for DVT.  TCD bubble study weakly positive for  a small right-to-left shunt likely clinically insignificant.47 y.o. male with past medical history significant for type 2 diabetes mellitus, HTN, HLD, asthma, history of tobacco abuse who presented to [facility] ED on 12/2 with complaints of right-sided numbness and weakness. While having lunch, he was having difficulty moving his right arm and became tremulous and unable to hold anything with his right hand. Also endorsed lightheadedness, dizziness. Given his symptoms he drove himself to the ED. On arrival to the ED he was having difficulty walking with right lower extremity numbness and weakness as well. Denies headaches, no nausea/vomiting, no slurred speech, no vision changes.  PAIN:  Are you having pain? Yes: NPRS scale: chronic/10 Pain location: chronic neck pain Pain description: aches/sore Aggravating factors: varies with posture Relieving factors: rest  PRECAUTIONS: None  RED FLAGS: None   WEIGHT BEARING RESTRICTIONS: No  FALLS: Has patient fallen in last 6 months? Yes. Number of falls 1 reports falling backwards  LIVING ENVIRONMENT: Lives with: lives with their family Lives in: House/apartment Stairs: Yes: Internal: 3 steps; can reach both and External: 3 steps; can reach both Has following equipment at home: None  PLOF: Independent. Works full time Scientist, product/process development  PATIENT  GOALS:   OBJECTIVE:  Note: Objective measures were completed at Evaluation unless otherwise noted.  Vitals at start of session: 98%, 69 bpm, 158/106 mmHg Vitals mid-session: 159/104 mmHg, 62 bpm  DIAGNOSTIC FINDINGS:  IMPRESSION: 1. Focus of acute/early subacute ischemia at the junction of the left precentral and postcentral gyri. 2. 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 3. Moderate bilateral C7 and mild right C5, C6 neural foraminal stenosis.  COGNITION: Overall cognitive status: Within functional limits for tasks assessed   SENSATION: Not tested Reports numbness and slight tingling extending from right rib cage to anterior right knee  COORDINATION: Intact: no pronator drift. Finger to nose WNL, rapid alternating movements intact, heel to shin intact  EDEMA:  none  MUSCLE TONE: WNL  MUSCLE LENGTH: WNL  DTRs:  NT  POSTURE: No Significant postural limitations  LOWER EXTREMITY ROM:     WNL  LOWER EXTREMITY MMT:    Grossly 5/5 symmetric, appreciate very subtle weakness to right knee extension  BED MOBILITY:  independent  TRANSFERS: independent  RAMP:  Independent  CURB:  Level of Assistance: Complete Independence Assistive device utilized: None Curb Comments:   STAIRS: Level of Assistance: Complete Independence Stair Negotiation Technique: Alternating Pattern  with No Rails Number of Stairs: 12  Height of Stairs: 4-6"  Comments:   GAIT: Gait pattern: WFL Distance walked:  Assistive device utilized: None Level of assistance: Complete Independence Comments:   FUNCTIONAL TESTS:  5 times sit to stand: 14 sec Berg Balance Scale: 56/56 Functional gait assessment: 29/30  M-CTSIB  Condition 1: Firm Surface, EO 30 Sec, Normal Sway  Condition 2: Firm Surface, EC 30 Sec, Normal Sway  Condition 3: Foam Surface, EO 30 Sec, Normal Sway  Condition 4: Foam Surface, EC 30 Sec, Mild Sway    PATIENT SURVEYS:  FOTO 77  TODAY'S  TREATMENT:  DATE: 07/10/23    PATIENT EDUCATION: Education details: assessment details. Discussion of stroke symptoms and optimal preventative measures.  Recommendation of prioritizing cardiovascular exercise and avoidance of Valsalva maneuver during resistance exercise.  Review and provided handout of B.E.FAST stroke signs/symptoms Person educated: Patient and Spouse Education method: Explanation and Handouts Education comprehension: verbalized understanding  HOME EXERCISE PROGRAM: N/A at this time  GOALS: Goals reviewed with patient? N/A   ASSESSMENT:  CLINICAL IMPRESSION: Patient is a 47 y.o. male who was seen today for physical therapy evaluation and treatment for recent hx of CVA affecting RLE.  Thankfully, demonstrates low risk for falls per outcome measures and demonstrates strength and coordination WNL.  Minimal weakness to right quadriceps appreciated, but again demonstrates independent functional mobility.  Exhibits elevated blood pressures throughout assessment with recording of vitals pre and during session and provided record for patient as he reports he will be visiting with PCP for ongoing management.  Patient does not require PT services at this time and recommend f/u with MD regarding medical management and encouraged activities prioritizing cardiovascular exercise and other techniques for stress management such as meditation and mindfulness exercises, verbalizes understanding.    OBJECTIVE IMPAIRMENTS: impaired sensation.   ACTIVITY LIMITATIONS:  none noted other than pt reports increased fatigue by end of day  PARTICIPATION LIMITATIONS:  none noted  PERSONAL FACTORS: Time since onset of injury/illness/exacerbation and 1-2 comorbidities: DM, high blood pressure  are also affecting patient's functional outcome.   REHAB POTENTIAL: N/A at this  time  CLINICAL DECISION MAKING: Stable/uncomplicated  EVALUATION COMPLEXITY: Low  PLAN:  PT FREQUENCY: one time visit  PT DURATION: one time visit  PLANNED INTERVENTIONS: [id number]- Self Care  PLAN FOR NEXT SESSION: n/a; recommend f/u with MD   9:12 AM, 07/10/23 M. Shary Decamp, PT, DPT Physical Therapist-  Office Number: [phone]

## 2023-07-10 NOTE — Patient Instructions (Addendum)
Medication Instructions:  Start taking amlodipine 2.5 mg daily. New script sent.  *If you need a refill on your cardiac medications before your next appointment, please call your pharmacy*   You are scheduled for a TEE (Transesophageal Echocardiogram) on Thursday, December 12 with Dr. Servando Salina.  Please arrive at the [facility] (Main Entrance A) at [facility]: [address] [place], [place] [address] at 9:30 AM (This time is 1 hour(s) before your procedure to ensure your preparation).   Free valet parking service is available. You will check in at ADMITTING.   *Please Note: You will receive a call the day before your procedure to confirm the appointment time. That time may have changed from the original time based on the schedule for that day.*   DIET:  Nothing to eat or drink after midnight except a sip of water with medications (see medication instructions below)  MEDICATION INSTRUCTIONS:  Do not take Metformin the morning of the procedure.  FYI:  For your safety, and to allow [place] to monitor your vital signs accurately during the surgery/procedure we request: If you have artificial nails, gel coating, SNS etc, please have those removed prior to your surgery/procedure. Not having the nail coverings /polish removed may result in cancellation or delay of your surgery/procedure.  Your support person will be asked to wait in the waiting room during your procedure.  It is OK to have someone drop you off and come back when you are ready to be discharged.  You cannot drive after the procedure and will need someone to drive you home.  Bring your insurance cards.  *Special Note: Every effort is made to have your procedure done on time. Occasionally there are emergencies that occur at the hospital that may cause delays. Please be patient if a delay does occur.      Follow-Up: At [facility], you and your health needs are our priority.  As part of our continuing mission to provide  you with exceptional heart care, we have created designated Provider Care Teams.  These Care Teams include your primary Cardiologist (physician) and Advanced Practice Providers (APPs -  Physician Assistants and Nurse Practitioners) who all work together to provide you with the care you need, when you need it.  We recommend signing up for the patient portal called "MyChart".  Sign up information is provided on this After Visit Summary.  MyChart is used to connect with patients for Virtual Visits (Telemedicine).  Patients are able to view lab/test results, encounter notes, upcoming appointments, etc.  Non-urgent messages can be sent to your provider as well.   To learn more about what you can do with MyChart, go to [email or web address].    Your next appointment:   3 month(s)  Provider:   First available APP.    Other Instructions Take and record blood pressure 3-4 times per week and record on forms office nurse provided. Education provided during visit for plant based Mediterranian diet.

## 2023-07-14 ENCOUNTER — Encounter: Payer: Self-pay | Admitting: Cardiology

## 2023-07-14 DIAGNOSIS — I639 Cerebral infarction, unspecified: Secondary | ICD-10-CM

## 2023-07-15 NOTE — OR Nursing (Signed)
Called patient with pre-procedure instructions for tomorrow.   Patient informed of:   Time to arrive for procedure. 0830 Remain NPO past midnight.  Must have a ride home and a responsible adult to remain with them for 24 ours post procedure.  Confirmed blood thinner. Plavix. Confirmed no breaks in taking blood thinner for 3+ weeks prior to procedure. Confirmed patient stopped all GLP-1s and GLP-2s for at least one week before procedure.

## 2023-07-16 ENCOUNTER — Ambulatory Visit (HOSPITAL_COMMUNITY)
Admission: RE | Admit: 2023-07-16 | Discharge: 2023-07-16 | Disposition: A | Discharge status: Home / Self Care | Payer: Managed Care, Other (non HMO) | Attending: Cardiology | Admitting: Cardiology

## 2023-07-16 ENCOUNTER — Ambulatory Visit (HOSPITAL_BASED_OUTPATIENT_CLINIC_OR_DEPARTMENT_OTHER)
Admission: RE | Admit: 2023-07-16 | Discharge: 2023-07-16 | Disposition: A | Discharge status: Home / Self Care | Payer: Managed Care, Other (non HMO) | Source: Ambulatory Visit | Attending: Cardiology | Admitting: Cardiology

## 2023-07-16 ENCOUNTER — Other Ambulatory Visit: Payer: Self-pay

## 2023-07-16 ENCOUNTER — Encounter (HOSPITAL_COMMUNITY): Payer: Self-pay | Admitting: Cardiology

## 2023-07-16 ENCOUNTER — Ambulatory Visit (HOSPITAL_COMMUNITY): Payer: Managed Care, Other (non HMO) | Admitting: Anesthesiology

## 2023-07-16 ENCOUNTER — Encounter (HOSPITAL_COMMUNITY)
Admission: RE | Disposition: A | Discharge status: Home / Self Care | Payer: Self-pay | Source: Home / Self Care | Attending: Cardiology

## 2023-07-16 DIAGNOSIS — Z87891 Personal history of nicotine dependence: Secondary | ICD-10-CM | POA: Diagnosis not present

## 2023-07-16 DIAGNOSIS — E669 Obesity, unspecified: Secondary | ICD-10-CM | POA: Diagnosis not present

## 2023-07-16 DIAGNOSIS — I10 Essential (primary) hypertension: Secondary | ICD-10-CM | POA: Insufficient documentation

## 2023-07-16 DIAGNOSIS — I639 Cerebral infarction, unspecified: Secondary | ICD-10-CM | POA: Diagnosis present

## 2023-07-16 DIAGNOSIS — E1165 Type 2 diabetes mellitus with hyperglycemia: Secondary | ICD-10-CM | POA: Insufficient documentation

## 2023-07-16 DIAGNOSIS — Z6832 Body mass index (BMI) 32.0-32.9, adult: Secondary | ICD-10-CM | POA: Insufficient documentation

## 2023-07-16 DIAGNOSIS — J45909 Unspecified asthma, uncomplicated: Secondary | ICD-10-CM | POA: Diagnosis not present

## 2023-07-16 DIAGNOSIS — E785 Hyperlipidemia, unspecified: Secondary | ICD-10-CM

## 2023-07-16 DIAGNOSIS — Z720 Tobacco use: Secondary | ICD-10-CM

## 2023-07-16 DIAGNOSIS — E118 Type 2 diabetes mellitus with unspecified complications: Secondary | ICD-10-CM

## 2023-07-16 HISTORY — PX: TRANSESOPHAGEAL ECHOCARDIOGRAM (CATH LAB): EP1270

## 2023-07-16 LAB — ECHO TEE

## 2023-07-16 LAB — GLUCOSE, CAPILLARY: Glucose-Capillary: 108 mg/dL — ABNORMAL HIGH (ref 70–99)

## 2023-07-16 SURGERY — TRANSESOPHAGEAL ECHOCARDIOGRAM (TEE) (CATHLAB)
Anesthesia: Monitor Anesthesia Care

## 2023-07-16 MED ORDER — PROPOFOL 10 MG/ML IV BOLUS
INTRAVENOUS | Status: DC | PRN
  Administered 2023-07-16: 100 mg via INTRAVENOUS
  Administered 2023-07-16: 50 mg via INTRAVENOUS
  Administered 2023-07-16: 100 mg via INTRAVENOUS
  Administered 2023-07-16: 50 mg via INTRAVENOUS

## 2023-07-16 MED ORDER — SODIUM CHLORIDE 0.9 % IV SOLN: INTRAVENOUS | Status: DC | PRN

## 2023-07-16 NOTE — Interval H&P Note (Signed)
History and Physical Interval Note:  07/16/2023 8:38 AM  Brian Reid  has presented today for surgery, with the diagnosis of CVA.  The various methods of treatment have been discussed with the patient and family. After consideration of risks, benefits and other options for treatment, the patient has consented to  Procedure(s): TRANSESOPHAGEAL ECHOCARDIOGRAM (N/A) as a surgical intervention.  The patient's history has been reviewed, patient examined, no change in status, stable for surgery.  I have reviewed the patient's chart and labs.  Questions were answered to the patient's satisfaction.     Cannon Quinton

## 2023-07-16 NOTE — Anesthesia Preprocedure Evaluation (Signed)
Anesthesia Evaluation  Patient identified by MRN, date of birth, ID band Patient awake    Reviewed: Allergy & Precautions, H&P , NPO status , Patient's Chart, lab work & pertinent test results  Airway Mallampati: II   Neck ROM: full    Dental   Pulmonary asthma , former smoker   breath sounds clear to auscultation       Cardiovascular negative cardio ROS  Rhythm:regular Rate:Normal     Neuro/Psych CVA    GI/Hepatic   Endo/Other    Renal/GU      Musculoskeletal   Abdominal   Peds  Hematology   Anesthesia Other Findings   Reproductive/Obstetrics                             Anesthesia Physical Anesthesia Plan  ASA: 3  Anesthesia Plan: MAC   Post-op Pain Management:    Induction: Intravenous  PONV Risk Score and Plan: 1 and Propofol infusion and Treatment may vary due to age or medical condition  Airway Management Planned: Nasal Cannula  Additional Equipment:   Intra-op Plan:   Post-operative Plan:   Informed Consent: I have reviewed the patients History and Physical, chart, labs and discussed the procedure including the risks, benefits and alternatives for the proposed anesthesia with the patient or authorized representative who has indicated his/her understanding and acceptance.     Dental advisory given  Plan Discussed with: CRNA, Anesthesiologist and Surgeon  Anesthesia Plan Comments:        Anesthesia Quick Evaluation

## 2023-07-16 NOTE — Transfer of Care (Signed)
Immediate Anesthesia Transfer of Care Note  Patient: Brian Reid  Procedure(s) Performed: TRANSESOPHAGEAL ECHOCARDIOGRAM  Patient Location:  CV post  Anesthesia Type:MAC  Level of Consciousness: drowsy and patient cooperative  Airway & Oxygen Therapy: Patient Spontanous Breathing and Patient connected to nasal cannula oxygen  Post-op Assessment: Report given to RN and Post -op Vital signs reviewed and stable  Post vital signs: Reviewed and stable  Last Vitals:  Vitals Value Taken Time  BP 102/64 1023      Pulse 79 1023  Resp 14 1023  SpO2 97 1023    Last Pain:  Vitals:   07/16/23 0846  TempSrc:   PainSc: 0-No pain         Complications: No notable events documented.

## 2023-07-16 NOTE — Progress Notes (Signed)
Echocardiogram Echocardiogram Transesophageal has been performed.  Lucendia Herrlich 07/16/2023, 10:22 AM

## 2023-07-16 NOTE — Discharge Instructions (Signed)
TEE   YOU HAD AN CARDIAC PROCEDURE TODAY: Refer to the procedure report and other information in the discharge instructions given to you for any specific questions about what was found during the examination. If this information does not answer your questions, please call Dr. Ganji's office at [phone] to clarify.   DIET: Your first meal following the procedure should be a light meal and then it is ok to progress to your normal diet. A half-sandwich or bowl of soup is an example of a good first meal. Heavy or fried foods are harder to digest and may make you feel nauseous or bloated. Drink plenty of fluids but you should avoid alcoholic beverages for 24 hours. If you had a esophageal dilation, please see attached instructions for diet.   ACTIVITY: Your care partner should take you home directly after the procedure. You should plan to take it easy, moving slowly for the rest of the day. You can resume normal activity the day after the procedure however YOU SHOULD NOT DRIVE, use power tools, machinery or perform tasks that involve climbing or major physical exertion for 24 hours (because of the sedation medicines used during the test).   SYMPTOMS TO REPORT IMMEDIATELY: A cardiologist can be reached at any hour. Please call [phone] for any of the following symptoms:  Vomiting of blood or coffee ground material  New, significant abdominal pain  New, significant chest pain or pain under the shoulder blades  Painful or persistently difficult swallowing  New shortness of breath  Black, tarry-looking or red, bloody stools  FOLLOW UP:  Please also call with any specific questions about appointments or follow up tests. 

## 2023-07-16 NOTE — CV Procedure (Signed)
    TRANSESOPHAGEAL ECHOCARDIOGRAM   NAME:  Brian Reid    MRN: [id number] DOB:  1975/10/23    ADMIT DATE: 07/16/2023  INDICATIONS: Recent CVA  PROCEDURE:   Informed consent was obtained prior to the procedure. The risks, benefits and alternatives for the procedure were discussed and the patient comprehended these risks.  Risks include, but are not limited to, cough, sore throat, vomiting, nausea, somnolence, esophageal and stomach trauma or perforation, bleeding, low blood pressure, aspiration, pneumonia, infection, trauma to the teeth and death.    Procedural time out performed. The oropharynx was anesthetized with viscous lidocaine.  Anesthesia was administered by the anaesthesilogy team to achieve and maintain moderate to deep conscious sedation.  The patient's heart rate, blood pressure, and oxygen saturation were monitored continuously during the procedure.  The transesophageal probe was inserted in the esophagus and stomach without difficulty and multiple views were obtained.   The patient tolerated the procedure well.  COMPLICATIONS:    There were no immediate complications.  KEY FINDINGS:  Normal LVEF, Positive agitated saline study with valsalva, trival MR, No LAA clot Full report to follow. Further management per primary team.   Thomasene Ripple, DO [facility]  [facility]  10:25 AM

## 2023-07-17 NOTE — Anesthesia Postprocedure Evaluation (Signed)
Anesthesia Post Note  Patient: Wilbor Lanes  Procedure(s) Performed: TRANSESOPHAGEAL ECHOCARDIOGRAM     Patient location during evaluation: PACU Anesthesia Type: MAC Level of consciousness: awake and alert Pain management: pain level controlled Vital Signs Assessment: post-procedure vital signs reviewed and stable Respiratory status: spontaneous breathing, nonlabored ventilation, respiratory function stable and patient connected to nasal cannula oxygen Cardiovascular status: stable and blood pressure returned to baseline Postop Assessment: no apparent nausea or vomiting Anesthetic complications: no   No notable events documented.  Last Vitals:  Vitals:   07/16/23 1045 07/16/23 1100  BP: 112/84 115/78  Pulse: 69 65  Resp: 15 17  Temp:    SpO2: 98% 98%    Last Pain:  Vitals:   07/16/23 1029  TempSrc:   PainSc: 0-No pain                 Tandi Hanko S

## 2023-07-20 ENCOUNTER — Ambulatory Visit: Payer: Managed Care, Other (non HMO) | Admitting: Family Medicine

## 2023-07-20 VITALS — BP 128/80 | HR 84 | Temp 98.1°F | Ht 70.0 in | Wt 227.2 lb

## 2023-07-20 DIAGNOSIS — M7712 Lateral epicondylitis, left elbow: Secondary | ICD-10-CM

## 2023-07-20 DIAGNOSIS — Z8673 Personal history of transient ischemic attack (TIA), and cerebral infarction without residual deficits: Secondary | ICD-10-CM

## 2023-07-20 DIAGNOSIS — I10 Essential (primary) hypertension: Secondary | ICD-10-CM

## 2023-07-20 DIAGNOSIS — Z7984 Long term (current) use of oral hypoglycemic drugs: Secondary | ICD-10-CM

## 2023-07-20 DIAGNOSIS — M4802 Spinal stenosis, cervical region: Secondary | ICD-10-CM

## 2023-07-20 DIAGNOSIS — M542 Cervicalgia: Secondary | ICD-10-CM

## 2023-07-20 DIAGNOSIS — E118 Type 2 diabetes mellitus with unspecified complications: Secondary | ICD-10-CM

## 2023-07-20 DIAGNOSIS — I639 Cerebral infarction, unspecified: Secondary | ICD-10-CM

## 2023-07-20 LAB — COMPREHENSIVE METABOLIC PANEL
ALT: 32 U/L (ref 0–53)
AST: 20 U/L (ref 0–37)
Albumin: 4.5 g/dL (ref 3.5–5.2)
Alkaline Phosphatase: 58 U/L (ref 39–117)
BUN: 9 mg/dL (ref 6–23)
CO2: 33 meq/L — ABNORMAL HIGH (ref 19–32)
Calcium: 9.5 mg/dL (ref 8.4–10.5)
Chloride: 102 meq/L (ref 96–112)
Creatinine, Ser: 0.99 mg/dL (ref 0.40–1.50)
GFR: 90.64 mL/min (ref >60.00)
Glucose, Bld: 119 mg/dL — ABNORMAL HIGH (ref 70–99)
Potassium: 4 meq/L (ref 3.5–5.1)
Sodium: 139 meq/L (ref 135–145)
Total Bilirubin: 0.7 mg/dL (ref 0.2–1.2)
Total Protein: 6.9 g/dL (ref 6.0–8.3)

## 2023-07-20 LAB — HEMOGLOBIN A1C: Hgb A1c MFr Bld: 6.8 % — ABNORMAL HIGH (ref 4.6–6.5)

## 2023-07-20 LAB — MICROALBUMIN / CREATININE URINE RATIO
Creatinine,U: 84.2 mg/dL
Microalb Creat Ratio: 0.8 mg/g (ref 0.0–30.0)
Microalb, Ur: <0.7 mg/dL (ref 0.0–1.9)

## 2023-07-20 MED ORDER — CYCLOBENZAPRINE HCL 5 MG PO TABS: 2.5000 mg | ORAL_TABLET | Freq: Every evening | ORAL | 1 refills | Status: DC | PRN

## 2023-07-20 NOTE — Patient Instructions (Signed)
Thanks for coming today.  I am glad the symptoms from earlier in the month have improved. Keep follow-up with cardiology and neurology, and can follow-up results from the echocardiogram and heart monitor with cardiology.  Blood pressure looks better at this time, continue amlodipine.  Continue same dose of cholesterol medication, unlikely cause of elbow symptoms.  See below.  I will check A1c and we can adjust diabetes treatment based on those results.  Would like to see A1c under 7.0 at this time.  Neck pain likely related to the cervical spinal stenosis or narrowing within the neck as well as a few areas where the nerves may be pinched.  We can try the muscle relaxant low-dose initially at bedtime, 1/2 to 1 pill as needed.  Watch for daytime sleepiness with that medication.  Be careful driving or operating machinery if you have taken that the night before.  Follow-up in the next few weeks and we can decide if meeting with spine specialist is needed at that time, see me sooner if worse.  Left elbow pain is likely tennis elbow or lateral epicondylitis.  See information below.  Try to avoid downward grasp carrying or repetitive motion of that elbow or wrist for now and recheck in the next few weeks.  Take care!  Return to the clinic or go to the nearest emergency room if any of your symptoms worsen or new symptoms occur.     Tennis Elbow  Tennis elbow (lateral epicondylitis) is inflammation of tendons in your outer forearm, near your elbow. Tendons are tissues that connect muscle to bone. When you have tennis elbow, inflammation affects the tendons that you use to bend your wrist and move your hand up. Inflammation occurs in the lower part of the upper arm bone (humerus), where the tendons connect to the bone (lateral epicondyle). Tennis elbow often affects people who play tennis, but anyone may get the condition from repeatedly extending the wrist or turning the forearm. What are the causes? This  condition is usually caused by repeatedly extending the wrist, turning the forearm, and using the hands. It can result from sports or work that requires repetitive forearm movements. In some cases, it may be caused by a sudden injury. What increases the risk? You are more likely to develop tennis elbow if you play tennis or another racket sport. You also have a higher risk if you frequently use your hands for work. Besides people who play tennis, others at greater risk include: People who use computers. Holiday representative workers. People who work in [employer]. Musicians. Cooks. Cashiers. What are the signs or symptoms? Symptoms of this condition include: Pain and tenderness in the forearm and the outer part of the elbow. Pain may be felt only when using the arm, or it may be there all the time. A burning feeling that starts in the elbow and spreads down the forearm. A weak grip in the hand. How is this diagnosed? This condition is diagnosed based on your symptoms, your medical history, and a physical exam. You may also have X-rays or an MRI to: Confirm the diagnosis. Look for other issues. Check for tears in the ligaments, muscles, or tendons. How is this treated? Resting and icing your arm is often the first treatment. Your health care provider may also recommend: Medicines to reduce pain and inflammation. These may be in the form of a pill, topical gels, or shots of a steroid medicine (cortisone). An elbow strap to reduce stress on the area. Physical  therapy. This may include massage or exercises or both. An elbow brace to restrict the movements that cause symptoms. If these treatments do not help relieve your symptoms, your health care provider may recommend surgery to remove damaged muscle and reattach healthy muscle to bone. Follow these instructions at home: If you have a brace or strap: Wear the brace or strap as told by your health care provider. Remove it only as told by your health  care provider. Check the skin around the brace or strap every day. Tell your health care provider about any concerns. Loosen the brace if your fingers tingle, become numb, or turn cold and blue. Keep the brace clean. If the brace or strap is not waterproof: Do not let it get wet. Cover it with a watertight covering when you take a bath or a shower. Managing pain, stiffness, and swelling  If directed, put ice on the injured area. To do this: If you have a removable brace or strap, remove it as told by your health care provider. Put ice in a plastic bag. Place a towel between your skin and the bag. Leave the ice on for 20 minutes, 2-3 times a day. Remove the ice if your skin turns bright red. This is very important. If you cannot feel pain, heat, or cold, you have a greater risk of damage to the area. Move your fingers often to reduce stiffness and swelling. Activity Rest your elbow and wrist and avoid activities that cause symptoms as told by your health care provider. Do physical therapy exercises as told by your health care provider. If you lift an object, lift it with your palm facing up. This reduces stress on your elbow. Lifestyle If your tennis elbow is caused by sports, check your equipment and make sure that: You use it correctly. It is good match for you. If your tennis elbow is caused by work or computer use, take frequent breaks to stretch your arm. Talk with your employer about ways to manage your condition at work. General instructions Take over-the-counter and prescription medicines only as told by your health care provider. Do not use any products that contain nicotine or tobacco. These products include cigarettes, chewing tobacco, and vaping devices, such as e-cigarettes. If you need help quitting, ask your health care provider. Keep all follow-up visits. This is important. How is this prevented? Before and after activity: Warm up and stretch before being active. Cool  down and stretch after being active. Give your body time to rest between periods of activity. During activity: Make sure to use equipment that fits you. If you play tennis, put power in your stroke with your lower body. Avoid using your arm only. Maintain physical fitness, including: Strength. Flexibility. Endurance. Do exercises to strengthen the forearm muscles. Contact a health care provider if: You have pain that gets worse or does not get better with treatment. You have numbness or weakness in your forearm, hand, or fingers. Get help right away if: Your pain is severe. You cannot move your wrist. Summary Tennis elbow (lateral epicondylitis) is inflammation of tendons in your outer forearm, near your elbow. Common symptoms include pain and tenderness in your forearm and the outer part of your elbow. This condition is usually caused by repeatedly extending your wrist, turning your forearm, and using your hands. The first treatment is often resting and icing your arm to relieve symptoms. Further treatment may include taking medicine, getting physical therapy, wearing a brace or strap, or having surgery.  This information is not intended to replace advice given to you by your health care provider. Make sure you discuss any questions you have with your health care provider. Document Revised: 01/31/2020 Document Reviewed: 01/31/2020 Elsevier Patient Education  2024 [employer].

## 2023-07-20 NOTE — Progress Notes (Signed)
Subjective:  Patient ID: Brian Reid, male    DOB: Nov 11, 1975  Age: 47 y.o. MRN: [id number]  CC:  Chief Complaint  Patient presents with   Medical Management of Chronic Issues    Pt is doing okay, notes no questions about meds at this time    Neck Pain    Pt notes has had long term neck pains that come and go and having a flare currently wondered what the suggestions are on how to help this    Cerebrovascular Accident    HPI Brian Reid presents for hospital follow-up -in office appointment today. Transition of care call noted from 07/09/2023.  No acute needs identified.  Follow-up confirmed. Discharged 07/08/2023.  Acute versus subacute ischemic CVA Admitted 07/06/2023 through 07/08/2023. Initially presented to ED with right-sided weakness and paresthesias, dizziness, lightheadedness.  MRI brain with focus of acute early subacute ischemia at the junction of the left precentral and postcentral gyri.  Neurology was consulted and followed patient during hospital course. CT angiogram of head and neck without emergent large vessel occlusion or high-grade stenosis.  TTE with EF 60 to 65% with no intra-atrial shunt.  Ultrasound lower extremities negative for DVT.  TCD bubble study weakly positive for small right-to-left shunt likely clinically insignificant.  Plan for dual antiplatelet therapy with aspirin and Plavix for 21 days followed by aspirin alone, was started on atorvastatin 40 mg daily, 6-week neurology follow-up.  Outpatient physical therapy. Outpatient cardiac monitor -30-day event monitor ordered, and outpatient TEE, cardiology follow-up planned.  Outpatient sleep study also discussed. Cardiology visit noted December 6. Added amlodipine 2.5 mg daily for blood pressure control. Taking daily - no new side effects.  TEE performed on December 12.  EF 60 to 65% with normal left ventricular function, no regional wall motion abnormalities.  No atrial or mitral stenosis.  Cannot exclude a  small PFO, agitated saline contrast bubble study was positive with shunting observed  PT eval - did not need further treatment, no right sided weakness at this time. Improved after 4-5 days. No new weakness/HA/facial droop/slurred speech.  BP Readings from Last 3 Encounters:  07/20/23 128/80  07/16/23 115/78  07/10/23 (!) 150/100     Diabetes Decreased control with A1c 7.6.  Metformin 500 mg twice daily with plan for outpatient PCP follow-up.  No home readings. Still taking BID.  Some dietary indiscretion prior to the hospital.  Had been exercising regularly.  Lab Results  Component Value Date   HGBA1C 7.6 (H) 03/20/2023   Wt Readings from Last 3 Encounters:  07/20/23 227 lb 3.2 oz (103.1 kg)  07/10/23 229 lb 12.8 oz (104.2 kg)  07/06/23 230 lb (104.3 kg)      Dyslipidemia Start atorvastatin 40 mg daily. Occasional soreness on outside of left arm - intermittent.  Lab Results  Component Value Date   CHOL 206 (H) 07/07/2023   HDL 30 (L) 07/07/2023   LDLCALC 109 (H) 07/07/2023   LDLDIRECT 119.0 03/20/2023   TRIG 333 (H) 07/07/2023   CHOLHDL 6.9 07/07/2023   No tobacco since 2015.   Neck pain Intermittent neck pain as above, no recent injury. Few years. More sore on certain days. No arm radiation.  Left more than right, but sometimes both sides.  MR cervical spine 07/07/23:  Unchanged mild spinal canal stenosis at C4-5, C5-6 and C6-7. Moderate bilateral C7 and mild right C5, C6 neural foraminal stenosis. Sore in am at times. Sometimes hard to sleep.    History Patient Active Problem  List   Diagnosis Date Noted   TIA (transient ischemic attack) 07/06/2023   Elevated BP without diagnosis of hypertension 07/06/2023   Pain in left hand 09/09/2021   Left forearm pain 04/12/2021   Acute appendicitis s/p lap appendectomy 04/02/2016 04/03/2016   Past Medical History:  Diagnosis Date   Asthma    Past Surgical History:  Procedure Laterality Date   APPENDECTOMY N/A     [employer] 06/27/2020   LAPAROSCOPIC APPENDECTOMY N/A 04/02/2016   Procedure: APPENDECTOMY LAPAROSCOPIC;  Surgeon: Chevis Pretty III, MD;  Location: [facility];  Service: General;  Laterality: N/A;   LEG SURGERY     age 62   OPEN REDUCTION SHOULDER DISLOCATION     age 71   TRANSESOPHAGEAL ECHOCARDIOGRAM (CATH LAB) N/A 07/16/2023   Procedure: TRANSESOPHAGEAL ECHOCARDIOGRAM;  Surgeon: Thomasene Ripple, DO;  Location: [facility];  Service: Cardiovascular;  Laterality: N/A;   No Known Allergies Prior to Admission medications   Medication Sig Start Date End Date Taking? Authorizing Provider  amLODipine (NORVASC) 2.5 MG tablet Take 1 tablet (2.5 mg total) by mouth daily. 07/10/23 10/08/23 Yes Rollene Rotunda, MD  aspirin EC 81 MG tablet Take 1 tablet (81 mg total) by mouth daily. Swallow whole. 07/09/23 10/07/23 Yes [place], Eric J, DO  atorvastatin (LIPITOR) 40 MG tablet Take 1 tablet (40 mg total) by mouth daily. 07/09/23 10/07/23 Yes [place], Alvira Philips, DO  clopidogrel (PLAVIX) 75 MG tablet Take 1 tablet (75 mg total) by mouth daily for 21 days. 07/09/23 07/30/23 Yes [place], Eric J, DO  metFORMIN (GLUCOPHAGE) 500 MG tablet Take 1 tablet (500 mg total) by mouth 2 (two) times daily with a meal. 1-2 times per day as tolerated. 05/18/23  Yes [name], MD   Social History   Socioeconomic History   Marital status: Married    Spouse name: Pattricia Boss   Number of children: 2   Years of education: post grad   Highest education level: Master's degree (e.g., MA, MS, MEng, MEd, MSW, MBA)  Occupational History    Comment: IT professional  Tobacco Use   Smoking status: Former   Smokeless tobacco: Never   Tobacco comments:    03/05/16 "very occasional now", 06/06/16 not smoking  Vaping Use   Vaping status: Never Used  Substance and Sexual Activity   Alcohol use: Yes    Alcohol/week: 2.0 standard drinks of alcohol    Types: 2 Standard drinks or equivalent per week    Comment: social   Drug use: No   Sexual  activity: Yes  Other Topics Concern   Not on file  Social History Narrative   Lives with wife and two kids   Drinks 1 cup of coffee a day    Social Drivers of Corporate investment banker Strain: Low Risk  (06/04/2023)   Overall Financial Resource Strain (CARDIA)    Difficulty of Paying Living Expenses: Not hard at all  Food Insecurity: No Food Insecurity (07/07/2023)   Hunger Vital Sign    Worried About Running Out of Food in the Last Year: Never true    Ran Out of Food in the Last Year: Never true  Transportation Needs: No Transportation Needs (07/07/2023)   PRAPARE - Administrator, Civil Service (Medical): No    Lack of Transportation (Non-Medical): No  Physical Activity: Sufficiently Active (06/04/2023)   Exercise Vital Sign    Days of Exercise per Week: 4 days    Minutes of Exercise per Session: 40  min  Stress: No Stress Concern Present (06/04/2023)   [employer] of Occupational Health - Occupational Stress Questionnaire    Feeling of Stress : Not at all  Social Connections: Socially Integrated (06/04/2023)   Social Connection and Isolation Panel [NHANES]    Frequency of Communication with Friends and Family: More than three times a week    Frequency of Social Gatherings with Friends and Family: Twice a week    Attends Religious Services: More than 4 times per year    Active Member of [employer] or Organizations: Yes    Attends Engineer, structural: More than 4 times per year    Marital Status: Married  Catering manager Violence: Not At Risk (07/08/2023)   Humiliation, Afraid, Rape, and Kick questionnaire    Fear of Current or Ex-Partner: No    Emotionally Abused: No    Physically Abused: No    Sexually Abused: No    Review of Systems  Per HPI.  Objective:   Vitals:   07/20/23 0950  BP: 128/80  Pulse: 84  Temp: 98.1 F (36.7 C)  TempSrc: Temporal  SpO2: 97%  Weight: 227 lb 3.2 oz (103.1 kg)  Height: 5\' 10"  (1.778 m)     Physical  Exam Vitals reviewed.  Constitutional:      Appearance: He is well-developed.  HENT:     Head: Normocephalic and atraumatic.  Neck:     Vascular: No carotid bruit or JVD.  Cardiovascular:     Rate and Rhythm: Normal rate and regular rhythm.     Heart sounds: Normal heart sounds. No murmur heard. Pulmonary:     Effort: Pulmonary effort is normal.     Breath sounds: Normal breath sounds. No rales.  Musculoskeletal:     Right lower leg: No edema.     Left lower leg: No edema.     Comments: C-spine, decreased extension greater than flexion, decreased rotation right greater than left.  No midline bony tenderness.  Slight discomfort paraspinals.   Left elbow, no skin changes, ecchymosis or redness.  No apparent effusion.  Tender over lateral epicondyle primarily, slightly distal.  Triceps, biceps and upper arm nontender.  Skin:    General: Skin is warm and dry.  Neurological:     Mental Status: He is alert and oriented to person, place, and time.     Comments: Equal upper extremity and lower extremity strength, equal facial movements, normal speech, equal grip strength, no pronator drift.  No focal deficit appreciated.  Psychiatric:        Mood and Affect: Mood normal.        Assessment & Plan:  Challen Maqueda is a 47 y.o. male . Cerebrovascular accident (CVA), unspecified mechanism (HCC)  -Some additional residual symptoms, now much improved.  Small PFO noted on imaging as above, currently has heart monitor in place.  Secondary prevention discussed with A1c less than 7, and sufficient hypertension and hyperlipidemia control, now on statin as above.  Continue follow-up with cardiology, neurology as planned.  ER precautions given.  Essential hypertension  -Improved with recent medication addition, no changes at this time.  Controlled type 2 diabetes mellitus with complication, without long-term current use of insulin (HCC) - Plan: Comprehensive metabolic panel, Hemoglobin A1c,  Microalbumin / creatinine urine ratio  -Repeat A1c shows improvement, no med changes at this time.  Cervical spinal stenosis - Plan: cyclobenzaprine (FLEXERIL) 5 MG tablet Neck pain - Plan: cyclobenzaprine (FLEXERIL) 5 MG tablet  -Likely degenerative changes, spinal  stenosis as contributor to pain.  Trial of low-dose muscle relaxant, consider PT or neurosurgery/Ortho eval if persistent.  Recheck next few weeks.  Left lateral epicondylitis  -Left elbow pain appears to be lateral epicondylosis.  Handout given, activity modification discussed, with option of over-the-counter counterforce brace if needed but will recheck in the next few weeks.  Meds ordered this encounter  Medications   cyclobenzaprine (FLEXERIL) 5 MG tablet    Sig: Take 0.5-1 tablets (2.5-5 mg total) by mouth at bedtime as needed for muscle spasms.    Dispense:  15 tablet    Refill:  1   Patient Instructions  Thanks for coming today.  I am glad the symptoms from earlier in the month have improved. Keep follow-up with cardiology and neurology, and can follow-up results from the echocardiogram and heart monitor with cardiology.  Blood pressure looks better at this time, continue amlodipine.  Continue same dose of cholesterol medication, unlikely cause of elbow symptoms.  See below.  I will check A1c and we can adjust diabetes treatment based on those results.  Would like to see A1c under 7.0 at this time.  Neck pain likely related to the cervical spinal stenosis or narrowing within the neck as well as a few areas where the nerves may be pinched.  We can try the muscle relaxant low-dose initially at bedtime, 1/2 to 1 pill as needed.  Watch for daytime sleepiness with that medication.  Be careful driving or operating machinery if you have taken that the night before.  Follow-up in the next few weeks and we can decide if meeting with spine specialist is needed at that time, see me sooner if worse.  Left elbow pain is likely tennis  elbow or lateral epicondylitis.  See information below.  Try to avoid downward grasp carrying or repetitive motion of that elbow or wrist for now and recheck in the next few weeks.  Take care!  Return to the clinic or go to the nearest emergency room if any of your symptoms worsen or new symptoms occur.     Tennis Elbow  Tennis elbow (lateral epicondylitis) is inflammation of tendons in your outer forearm, near your elbow. Tendons are tissues that connect muscle to bone. When you have tennis elbow, inflammation affects the tendons that you use to bend your wrist and move your hand up. Inflammation occurs in the lower part of the upper arm bone (humerus), where the tendons connect to the bone (lateral epicondyle). Tennis elbow often affects people who play tennis, but anyone may get the condition from repeatedly extending the wrist or turning the forearm. What are the causes? This condition is usually caused by repeatedly extending the wrist, turning the forearm, and using the hands. It can result from sports or work that requires repetitive forearm movements. In some cases, it may be caused by a sudden injury. What increases the risk? You are more likely to develop tennis elbow if you play tennis or another racket sport. You also have a higher risk if you frequently use your hands for work. Besides people who play tennis, others at greater risk include: People who use computers. Holiday representative workers. People who work in [employer]. Musicians. Cooks. Cashiers. What are the signs or symptoms? Symptoms of this condition include: Pain and tenderness in the forearm and the outer part of the elbow. Pain may be felt only when using the arm, or it may be there all the time. A burning feeling that starts in the elbow and  spreads down the forearm. A weak grip in the hand. How is this diagnosed? This condition is diagnosed based on your symptoms, your medical history, and a physical exam. You may  also have X-rays or an MRI to: Confirm the diagnosis. Look for other issues. Check for tears in the ligaments, muscles, or tendons. How is this treated? Resting and icing your arm is often the first treatment. Your health care provider may also recommend: Medicines to reduce pain and inflammation. These may be in the form of a pill, topical gels, or shots of a steroid medicine (cortisone). An elbow strap to reduce stress on the area. Physical therapy. This may include massage or exercises or both. An elbow brace to restrict the movements that cause symptoms. If these treatments do not help relieve your symptoms, your health care provider may recommend surgery to remove damaged muscle and reattach healthy muscle to bone. Follow these instructions at home: If you have a brace or strap: Wear the brace or strap as told by your health care provider. Remove it only as told by your health care provider. Check the skin around the brace or strap every day. Tell your health care provider about any concerns. Loosen the brace if your fingers tingle, become numb, or turn cold and blue. Keep the brace clean. If the brace or strap is not waterproof: Do not let it get wet. Cover it with a watertight covering when you take a bath or a shower. Managing pain, stiffness, and swelling  If directed, put ice on the injured area. To do this: If you have a removable brace or strap, remove it as told by your health care provider. Put ice in a plastic bag. Place a towel between your skin and the bag. Leave the ice on for 20 minutes, 2-3 times a day. Remove the ice if your skin turns bright red. This is very important. If you cannot feel pain, heat, or cold, you have a greater risk of damage to the area. Move your fingers often to reduce stiffness and swelling. Activity Rest your elbow and wrist and avoid activities that cause symptoms as told by your health care provider. Do physical therapy exercises as told by  your health care provider. If you lift an object, lift it with your palm facing up. This reduces stress on your elbow. Lifestyle If your tennis elbow is caused by sports, check your equipment and make sure that: You use it correctly. It is good match for you. If your tennis elbow is caused by work or computer use, take frequent breaks to stretch your arm. Talk with your employer about ways to manage your condition at work. General instructions Take over-the-counter and prescription medicines only as told by your health care provider. Do not use any products that contain nicotine or tobacco. These products include cigarettes, chewing tobacco, and vaping devices, such as e-cigarettes. If you need help quitting, ask your health care provider. Keep all follow-up visits. This is important. How is this prevented? Before and after activity: Warm up and stretch before being active. Cool down and stretch after being active. Give your body time to rest between periods of activity. During activity: Make sure to use equipment that fits you. If you play tennis, put power in your stroke with your lower body. Avoid using your arm only. Maintain physical fitness, including: Strength. Flexibility. Endurance. Do exercises to strengthen the forearm muscles. Contact a health care provider if: You have pain that gets worse or does  not get better with treatment. You have numbness or weakness in your forearm, hand, or fingers. Get help right away if: Your pain is severe. You cannot move your wrist. Summary Tennis elbow (lateral epicondylitis) is inflammation of tendons in your outer forearm, near your elbow. Common symptoms include pain and tenderness in your forearm and the outer part of your elbow. This condition is usually caused by repeatedly extending your wrist, turning your forearm, and using your hands. The first treatment is often resting and icing your arm to relieve symptoms. Further treatment  may include taking medicine, getting physical therapy, wearing a brace or strap, or having surgery. This information is not intended to replace advice given to you by your health care provider. Make sure you discuss any questions you have with your health care provider. Document Revised: 01/31/2020 Document Reviewed: 01/31/2020 Elsevier Patient Education  2024 Elsevier Inc.     Signed,   Meredith Staggers, MD [facility], [facility] 07/20/23 10:38 AM

## 2023-07-21 ENCOUNTER — Encounter: Payer: Self-pay | Admitting: Family Medicine

## 2023-07-30 ENCOUNTER — Telehealth: Payer: Self-pay

## 2023-07-30 ENCOUNTER — Ambulatory Visit: Payer: Managed Care, Other (non HMO) | Admitting: Family Medicine

## 2023-07-30 ENCOUNTER — Encounter: Payer: Self-pay | Admitting: Family Medicine

## 2023-07-30 ENCOUNTER — Telehealth: Payer: Self-pay | Admitting: Cardiology

## 2023-07-30 VITALS — BP 126/70 | HR 76 | Temp 97.9°F | Ht 70.0 in | Wt 226.0 lb

## 2023-07-30 DIAGNOSIS — J069 Acute upper respiratory infection, unspecified: Secondary | ICD-10-CM | POA: Diagnosis not present

## 2023-07-30 DIAGNOSIS — R058 Other specified cough: Secondary | ICD-10-CM

## 2023-07-30 DIAGNOSIS — R062 Wheezing: Secondary | ICD-10-CM

## 2023-07-30 LAB — POCT INFLUENZA A/B
Influenza A, POC: NEGATIVE
Influenza B, POC: NEGATIVE

## 2023-07-30 LAB — POC COVID19 BINAXNOW: SARS Coronavirus 2 Ag: NEGATIVE

## 2023-07-30 MED ORDER — BENZONATATE 100 MG PO CAPS: 100.0000 mg | ORAL_CAPSULE | Freq: Three times a day (TID) | ORAL | 0 refills | Status: DC | PRN

## 2023-07-30 MED ORDER — AZITHROMYCIN 250 MG PO TABS: ORAL_TABLET | ORAL | 0 refills | Status: AC

## 2023-07-30 MED ORDER — ALBUTEROL SULFATE HFA 108 (90 BASE) MCG/ACT IN AERS: 1.0000 | INHALATION_SPRAY | Freq: Four times a day (QID) | RESPIRATORY_TRACT | 0 refills | Status: DC | PRN

## 2023-07-30 NOTE — Telephone Encounter (Signed)
Called and spoke with patient to let him know that I have sent a mychart message from Dr Antoine Poche indicating that patient does not need to continue on the clopidogrel as it was only prescribed by other provided to be taken for 3 weeks. He voiced understanding.

## 2023-07-30 NOTE — Patient Instructions (Addendum)
Sorry to hear that you are sick.  I do suspect that you have a virus and that should improve within the next 4 to 5 days.  I will run antibiotic to hold onto if the cough does not continue to improve into next week, but again that would not help if this is a virus.  Continue Mucinex as needed for cough, Tessalon Perles if needed to help block cough as well.  Albuterol inhaler was prescribed if you are having wheezing but I would reserve that just for true wheezing, not just congestion.  If you require albuterol more than once or twice a day, fevers, or other worsening symptoms return for recheck.  Hope you are better soon!  With states that he is rescheduled for today or something because of upper Respiratory Infection, Adult An upper respiratory infection (URI) is a common viral infection of the nose, throat, and upper air passages that lead to the lungs. The most common type of URI is the common cold. URIs usually get better on their own, without medical treatment. What are the causes? A URI is caused by a virus. You may catch a virus by: Breathing in droplets from an infected person's cough or sneeze. Touching something that has been exposed to the virus (is contaminated) and then touching your mouth, nose, or eyes. What increases the risk? You are more likely to get a URI if: You are very young or very old. You have close contact with others, such as at work, school, or a health care facility. You smoke. You have long-term (chronic) heart or lung disease. You have a weakened disease-fighting system (immune system). You have nasal allergies or asthma. You are experiencing a lot of stress. You have poor nutrition. What are the signs or symptoms? A URI usually involves some of the following symptoms: Runny or stuffy (congested) nose. Cough. Sneezing. Sore throat. Headache. Fatigue. Fever. Loss of appetite. Pain in your forehead, behind your eyes, and over your cheekbones (sinus  pain). Muscle aches. Redness or irritation of the eyes. Pressure in the ears or face. How is this diagnosed? This condition may be diagnosed based on your medical history and symptoms, and a physical exam. Your health care provider may use a swab to take a mucus sample from your nose (nasal swab). This sample can be tested to determine what virus is causing the illness. How is this treated? URIs usually get better on their own within 7-10 days. Medicines cannot cure URIs, but your health care provider may recommend certain medicines to help relieve symptoms, such as: Over-the-counter cold medicines. Cough suppressants. Coughing is a type of defense against infection that helps to clear the respiratory system, so take these medicines only as recommended by your health care provider. Fever-reducing medicines. Follow these instructions at home: Activity Rest as needed. If you have a fever, stay home from work or school until your fever is gone or until your health care provider says your URI cannot spread to other people (is no longer contagious). Your health care provider may have you wear a face mask to prevent your infection from spreading. Relieving symptoms Gargle with a mixture of salt and water 3-4 times a day or as needed. To make salt water, completely dissolve -1 tsp (3-6 g) of salt in 1 cup (237 mL) of warm water. Use a cool-mist humidifier to add moisture to the air. This can help you breathe more easily. Eating and drinking  Drink enough fluid to keep your urine  pale yellow. Eat soups and other clear broths. General instructions  Take over-the-counter and prescription medicines only as told by your health care provider. These include cold medicines, fever reducers, and cough suppressants. Do not use any products that contain nicotine or tobacco. These products include cigarettes, chewing tobacco, and vaping devices, such as e-cigarettes. If you need help quitting, ask your health  care provider. Stay away from secondhand smoke. Stay up to date on all immunizations, including the yearly (annual) flu vaccine. Keep all follow-up visits. This is important. How to prevent the spread of infection to others URIs can be contagious. To prevent the infection from spreading: Wash your hands with soap and water for at least 20 seconds. If soap and water are not available, use hand sanitizer. Avoid touching your mouth, face, eyes, or nose. Cough or sneeze into a tissue or your sleeve or elbow instead of into your hand or into the air.  Contact a health care provider if: You are getting worse instead of better. You have a fever or chills. Your mucus is brown or red. You have yellow or brown discharge coming from your nose. You have pain in your face, especially when you bend forward. You have swollen neck glands. You have pain while swallowing. You have white areas in the back of your throat. Get help right away if: You have shortness of breath that gets worse. You have severe or persistent: Headache. Ear pain. Sinus pain. Chest pain. You have chronic lung disease along with any of the following: Making high-pitched whistling sounds when you breathe, most often when you breathe out (wheezing). Prolonged cough (more than 14 days). Coughing up blood. A change in your usual mucus. You have a stiff neck. You have changes in your: Vision. Hearing. Thinking. Mood. These symptoms may be an emergency. Get help right away. Call 911. Do not wait to see if the symptoms will go away. Do not drive yourself to the hospital. Summary An upper respiratory infection (URI) is a common infection of the nose, throat, and upper air passages that lead to the lungs. A URI is caused by a virus. URIs usually get better on their own within 7-10 days. Medicines cannot cure URIs, but your health care provider may recommend certain medicines to help relieve symptoms. This information is not  intended to replace advice given to you by your health care provider. Make sure you discuss any questions you have with your health care provider. Document Revised: 02/20/2021 Document Reviewed: 02/20/2021 Else[employer] Patient Education  2024 [employer] Cough, Adult Coughing is a reflex that clears your throat and airways (respiratory system). It helps heal and protect your lungs. It is normal to cough from time to time. A cough that happens with other symptoms or that lasts a long time may be a sign of a condition that needs treatment. A short-term (acute) cough may only last 2-3 weeks. A long-term (chronic) cough may last 8 or more weeks. Coughing is often caused by: Diseases, such as: An infection of the respiratory system. Asthma or other heart or lung diseases. Gastroesophageal reflux. This is when acid comes back up from the stomach. Breathing in things that irritate your lungs. Allergies. Postnasal drip. This is when mucus runs down the back of your throat. Smoking. Some medicines. Follow these instructions at home: Medicines Take over-the-counter and prescription medicines only as told by your health care provider. Talk with your provider before you take cough medicine (cough suppressants). Eating and drinking Do not  drink alcohol. Avoid caffeine. Drink enough fluid to keep your pee (urine) pale yellow. Lifestyle Avoid cigarette smoke. Do not use any products that contain nicotine or tobacco. These products include cigarettes, chewing tobacco, and vaping devices, such as e-cigarettes. If you need help quitting, ask your provider. Avoid things that make you cough. These may include perfumes, candles, cleaning products, or campfire smoke. General instructions  Watch for any changes to your cough. Tell your provider about them. Always cover your mouth when you cough. If the air is dry in your bedroom or home, use a cool mist vaporizer or humidifier. If your cough is worse at  night, try to sleep in a semi-upright position. Rest as needed. Contact a health care provider if: You have new symptoms, or your symptoms get worse. You cough up pus. You have a fever that does not go away or a cough that does not get better after 2-3 weeks. You cannot control your cough with medicine, and you are losing sleep. You have pain that gets worse or is not helped with medicine. You lose weight for no clear reason. You have night sweats. Get help right away if: You cough up blood. You have trouble breathing. Your heart is beating very fast. These symptoms may be an emergency. Get help right away. Call 911. Do not wait to see if the symptoms will go away. Do not drive yourself to the hospital. This information is not intended to replace advice given to you by your health care provider. Make sure you discuss any questions you have with your health care provider. Document Revised: 03/21/2022 Document Reviewed: 03/21/2022 Else[employer] Patient Education  2024 [employer].

## 2023-07-30 NOTE — Telephone Encounter (Signed)
Pt c/o medication issue:  1. Name of Medication:   clopidogrel (PLAVIX) 75 MG tablet   2. How are you currently taking this medication (dosage and times per day)?   As prescribed  3. Are you having a reaction (difficulty breathing--STAT)?   No  4. What is your medication issue?   Patient stated he has completed the course of this medication and wants to know if he will need to continue this medication.  If yes, he will need to get a refill.

## 2023-07-30 NOTE — Telephone Encounter (Signed)
 Called patient with no answer. Left message to return call.

## 2023-07-30 NOTE — Progress Notes (Signed)
Subjective:  Patient ID: Brian Reid, male    DOB: [birth year]  Age: 47 y.o. MRN: [id number]  CC:  Chief Complaint  Patient presents with   Cough    3 days ago started early in the morning with cough, wheezing sound with breathing, productive cough, has used mucinex    HPI Brian Reid presents for   Cough Started 2 days ago with cough. No fever. No known sick contacts. Seeing Christmas carols few days prior - slight discomfort in throat initially.  Min nasal congestion, then cough started 2 days ago. Productive cough as above with some wheezing sound - early in the morning, and sometimes during the day.  He denies hx of asthma. Notes this was placed in medical record in the past but does not have this in medical record. This was entered in 2022.  Eating and drinking ok.  No confusion or chest pain.   Tx mucinex  History Patient Active Problem List   Diagnosis Date Noted   TIA (transient ischemic attack) 07/06/2023   Elevated BP without diagnosis of hypertension 07/06/2023   Pain in left hand 09/09/2021   Left forearm pain 04/12/2021   Acute appendicitis s/p lap appendectomy 04/02/2016 04/03/2016   Past Medical History:  Diagnosis Date   Asthma    Past Surgical History:  Procedure Laterality Date   APPENDECTOMY N/A    [facility] 06/27/2020   LAPAROSCOPIC APPENDECTOMY N/A 04/02/2016   Procedure: APPENDECTOMY LAPAROSCOPIC;  Surgeon: Chevis Pretty III, MD;  Location: [facility];  Service: General;  Laterality: N/A;   LEG SURGERY     age 49   OPEN REDUCTION SHOULDER DISLOCATION     age 33   TRANSESOPHAGEAL ECHOCARDIOGRAM (CATH LAB) N/A 07/16/2023   Procedure: TRANSESOPHAGEAL ECHOCARDIOGRAM;  Surgeon: Thomasene Ripple, DO;  Location: [facility];  Service: Cardiovascular;  Laterality: N/A;   No Known Allergies Prior to Admission medications   Medication Sig Start Date End Date Taking? Authorizing Provider  amLODipine (NORVASC) 2.5 MG tablet Take 1 tablet (2.5 mg total) by mouth  daily. 07/10/23 10/08/23  Rollene Rotunda, MD  aspirin EC 81 MG tablet Take 1 tablet (81 mg total) by mouth daily. Swallow whole. 07/09/23 10/07/23  [place], Alvira Philips, DO  atorvastatin (LIPITOR) 40 MG tablet Take 1 tablet (40 mg total) by mouth daily. 07/09/23 10/07/23  [place], Eric J, DO  clopidogrel (PLAVIX) 75 MG tablet Take 1 tablet (75 mg total) by mouth daily for 21 days. 07/09/23 07/30/23  [place], Alvira Philips, DO  cyclobenzaprine (FLEXERIL) 5 MG tablet Take 0.5-1 tablets (2.5-5 mg total) by mouth at bedtime as needed for muscle spasms. 07/20/23   [name], MD  metFORMIN (GLUCOPHAGE) 500 MG tablet Take 1 tablet (500 mg total) by mouth 2 (two) times daily with a meal. 1-2 times per day as tolerated. 05/18/23   [name], MD   Social History   Socioeconomic History   Marital status: Married    Spouse name: Pattricia Boss   Number of children: 2   Years of education: post grad   Highest education level: Master's degree (e.g., MA, MS, MEng, MEd, MSW, MBA)  Occupational History    Comment: IT professional  Tobacco Use   Smoking status: Former   Smokeless tobacco: Never   Tobacco comments:    03/05/16 "very occasional now", 06/06/16 not smoking  Vaping Use   Vaping status: Never Used  Substance and Sexual Activity   Alcohol use: Yes    Alcohol/week: 2.0 standard  drinks of alcohol    Types: 2 Standard drinks or equivalent per week    Comment: social   Drug use: No   Sexual activity: Yes  Other Topics Concern   Not on file  Social History Narrative   Lives with wife and two kids   Drinks 1 cup of coffee a day    Social Drivers of Corporate investment banker Strain: Low Risk  (06/04/2023)   Overall Financial Resource Strain (CARDIA)    Difficulty of Paying Living Expenses: Not hard at all  Food Insecurity: No Food Insecurity (07/07/2023)   Hunger Vital Sign    Worried About Running Out of Food in the Last Year: Never true    Ran Out of Food in the Last Year: Never true  Transportation  Needs: No Transportation Needs (07/07/2023)   PRAPARE - Administrator, Civil Service (Medical): No    Lack of Transportation (Non-Medical): No  Physical Activity: Sufficiently Active (06/04/2023)   Exercise Vital Sign    Days of Exercise per Week: 4 days    Minutes of Exercise per Session: 40 min  Stress: No Stress Concern Present (06/04/2023)   [employer] of Occupational Health - Occupational Stress Questionnaire    Feeling of Stress : Not at all  Social Connections: Socially Integrated (06/04/2023)   Social Connection and Isolation Panel [NHANES]    Frequency of Communication with Friends and Family: More than three times a week    Frequency of Social Gatherings with Friends and Family: Twice a week    Attends Religious Services: More than 4 times per year    Active Member of [employer] or Organizations: Yes    Attends Banker Meetings: More than 4 times per year    Marital Status: Married  Catering manager Violence: Not At Risk (07/08/2023)   Humiliation, Afraid, Rape, and Kick questionnaire    Fear of Current or Ex-Partner: No    Emotionally Abused: No    Physically Abused: No    Sexually Abused: No    Review of Systems Per HPI.   Objective:   Vitals:   07/30/23 0910  BP: 126/70  Pulse: 76  Temp: 97.9 F (36.6 C)  TempSrc: Temporal  SpO2: 97%  Weight: 226 lb (102.5 kg)  Height: 5\' 10"  (1.778 m)     Physical Exam Vitals reviewed.  Constitutional:      Appearance: He is well-developed.  HENT:     Head: Normocephalic and atraumatic.     Right Ear: Tympanic membrane, ear canal and external ear normal.     Left Ear: Tympanic membrane, ear canal and external ear normal.     Nose: No rhinorrhea.     Mouth/Throat:     Pharynx: No oropharyngeal exudate or posterior oropharyngeal erythema.  Eyes:     Conjunctiva/sclera: Conjunctivae normal.     Pupils: Pupils are equal, round, and reactive to light.  Cardiovascular:     Rate and Rhythm:  Normal rate and regular rhythm.     Heart sounds: Normal heart sounds. No murmur heard. Pulmonary:     Effort: Pulmonary effort is normal.     Breath sounds: Normal breath sounds. No wheezing, rhonchi or rales.  Abdominal:     Palpations: Abdomen is soft.     Tenderness: There is no abdominal tenderness.  Musculoskeletal:     Cervical back: Neck supple.  Lymphadenopathy:     Cervical: No cervical adenopathy.  Skin:    General: Skin  is warm and dry.     Findings: No rash.  Neurological:     Mental Status: He is alert and oriented to person, place, and time.  Psychiatric:        Behavior: Behavior normal.     Results for orders placed or performed in visit on 07/30/23  POC COVID-19 BinaxNow   Collection Time: 07/30/23  9:29 AM  Result Value Ref Range   SARS Coronavirus 2 Ag Negative Negative  POCT Influenza A/B   Collection Time: 07/30/23  9:30 AM  Result Value Ref Range   Influenza A, POC Negative Negative   Influenza B, POC Negative Negative      Assessment & Plan:  Brian Reid is a 47 y.o. male . Productive cough - Plan: POC COVID-19 BinaxNow, POCT Influenza A/B, azithromycin (ZITHROMAX) 250 MG tablet, benzonatate (TESSALON) 100 MG capsule  Upper respiratory tract infection, unspecified type  Wheeze - Plan: albuterol (VENTOLIN HFA) 108 (90 Base) MCG/ACT inhaler  Suspected viral URI at this time, lungs clear, audible wheeze, no history of asthma symptomatic care with Mucinex, Tessalon Perles as needed and option of azithromycin if cough is not improving into next week but this is a viral nature at this time.  Albuterol if needed for wheezing with RTC precautions.  Meds ordered this encounter  Medications   azithromycin (ZITHROMAX) 250 MG tablet    Sig: Take 2 tablets on day 1, then 1 tablet daily on days 2 through 5    Dispense:  6 tablet    Refill:  0   benzonatate (TESSALON) 100 MG capsule    Sig: Take 1 capsule (100 mg total) by mouth 3 (three) times daily as  needed for cough.    Dispense:  20 capsule    Refill:  0   albuterol (VENTOLIN HFA) 108 (90 Base) MCG/ACT inhaler    Sig: Inhale 1-2 puffs into the lungs every 6 (six) hours as needed for wheezing or shortness of breath.    Dispense:  8 g    Refill:  0   Patient Instructions  Sorry to hear that you are sick.  I do suspect that you have a virus and that should improve within the next 4 to 5 days.  I will run antibiotic to hold onto if the cough does not continue to improve into next week, but again that would not help if this is a virus.  Continue Mucinex as needed for cough, Tessalon Perles if needed to help block cough as well.  Albuterol inhaler was prescribed if you are having wheezing but I would reserve that just for true wheezing, not just congestion.  If you require albuterol more than once or twice a day, fevers, or other worsening symptoms return for recheck.  Hope you are better soon!  With states that he is rescheduled for today or something because of upper Respiratory Infection, Adult An upper respiratory infection (URI) is a common viral infection of the nose, throat, and upper air passages that lead to the lungs. The most common type of URI is the common cold. URIs usually get better on their own, without medical treatment. What are the causes? A URI is caused by a virus. You may catch a virus by: Breathing in droplets from an infected person's cough or sneeze. Touching something that has been exposed to the virus (is contaminated) and then touching your mouth, nose, or eyes. What increases the risk? You are more likely to get a URI if: You are  very young or very old. You have close contact with others, such as at work, school, or a health care facility. You smoke. You have long-term (chronic) heart or lung disease. You have a weakened disease-fighting system (immune system). You have nasal allergies or asthma. You are experiencing a lot of stress. You have poor  nutrition. What are the signs or symptoms? A URI usually involves some of the following symptoms: Runny or stuffy (congested) nose. Cough. Sneezing. Sore throat. Headache. Fatigue. Fever. Loss of appetite. Pain in your forehead, behind your eyes, and over your cheekbones (sinus pain). Muscle aches. Redness or irritation of the eyes. Pressure in the ears or face. How is this diagnosed? This condition may be diagnosed based on your medical history and symptoms, and a physical exam. Your health care provider may use a swab to take a mucus sample from your nose (nasal swab). This sample can be tested to determine what virus is causing the illness. How is this treated? URIs usually get better on their own within 7-10 days. Medicines cannot cure URIs, but your health care provider may recommend certain medicines to help relieve symptoms, such as: Over-the-counter cold medicines. Cough suppressants. Coughing is a type of defense against infection that helps to clear the respiratory system, so take these medicines only as recommended by your health care provider. Fever-reducing medicines. Follow these instructions at home: Activity Rest as needed. If you have a fever, stay home from work or school until your fever is gone or until your health care provider says your URI cannot spread to other people (is no longer contagious). Your health care provider may have you wear a face mask to prevent your infection from spreading. Relieving symptoms Gargle with a mixture of salt and water 3-4 times a day or as needed. To make salt water, completely dissolve -1 tsp (3-6 g) of salt in 1 cup (237 mL) of warm water. Use a cool-mist humidifier to add moisture to the air. This can help you breathe more easily. Eating and drinking  Drink enough fluid to keep your urine pale yellow. Eat soups and other clear broths. General instructions  Take over-the-counter and prescription medicines only as told by  your health care provider. These include cold medicines, fever reducers, and cough suppressants. Do not use any products that contain nicotine or tobacco. These products include cigarettes, chewing tobacco, and vaping devices, such as e-cigarettes. If you need help quitting, ask your health care provider. Stay away from secondhand smoke. Stay up to date on all immunizations, including the yearly (annual) flu vaccine. Keep all follow-up visits. This is important. How to prevent the spread of infection to others URIs can be contagious. To prevent the infection from spreading: Wash your hands with soap and water for at least 20 seconds. If soap and water are not available, use hand sanitizer. Avoid touching your mouth, face, eyes, or nose. Cough or sneeze into a tissue or your sleeve or elbow instead of into your hand or into the air.  Contact a health care provider if: You are getting worse instead of better. You have a fever or chills. Your mucus is brown or red. You have yellow or brown discharge coming from your nose. You have pain in your face, especially when you bend forward. You have swollen neck glands. You have pain while swallowing. You have white areas in the back of your throat. Get help right away if: You have shortness of breath that gets worse. You have severe  or persistent: Headache. Ear pain. Sinus pain. Chest pain. You have chronic lung disease along with any of the following: Making high-pitched whistling sounds when you breathe, most often when you breathe out (wheezing). Prolonged cough (more than 14 days). Coughing up blood. A change in your usual mucus. You have a stiff neck. You have changes in your: Vision. Hearing. Thinking. Mood. These symptoms may be an emergency. Get help right away. Call 911. Do not wait to see if the symptoms will go away. Do not drive yourself to the hospital. Summary An upper respiratory infection (URI) is a common infection of  the nose, throat, and upper air passages that lead to the lungs. A URI is caused by a virus. URIs usually get better on their own within 7-10 days. Medicines cannot cure URIs, but your health care provider may recommend certain medicines to help relieve symptoms. This information is not intended to replace advice given to you by your health care provider. Make sure you discuss any questions you have with your health care provider. Document Revised: 02/20/2021 Document Reviewed: 02/20/2021 Elsevier Patient Education  2024 Elsevier Inc. Cough, Adult Coughing is a reflex that clears your throat and airways (respiratory system). It helps heal and protect your lungs. It is normal to cough from time to time. A cough that happens with other symptoms or that lasts a long time may be a sign of a condition that needs treatment. A short-term (acute) cough may only last 2-3 weeks. A long-term (chronic) cough may last 8 or more weeks. Coughing is often caused by: Diseases, such as: An infection of the respiratory system. Asthma or other heart or lung diseases. Gastroesophageal reflux. This is when acid comes back up from the stomach. Breathing in things that irritate your lungs. Allergies. Postnasal drip. This is when mucus runs down the back of your throat. Smoking. Some medicines. Follow these instructions at home: Medicines Take over-the-counter and prescription medicines only as told by your health care provider. Talk with your provider before you take cough medicine (cough suppressants). Eating and drinking Do not drink alcohol. Avoid caffeine. Drink enough fluid to keep your pee (urine) pale yellow. Lifestyle Avoid cigarette smoke. Do not use any products that contain nicotine or tobacco. These products include cigarettes, chewing tobacco, and vaping devices, such as e-cigarettes. If you need help quitting, ask your provider. Avoid things that make you cough. These may include perfumes,  candles, cleaning products, or campfire smoke. General instructions  Watch for any changes to your cough. Tell your provider about them. Always cover your mouth when you cough. If the air is dry in your bedroom or home, use a cool mist vaporizer or humidifier. If your cough is worse at night, try to sleep in a semi-upright position. Rest as needed. Contact a health care provider if: You have new symptoms, or your symptoms get worse. You cough up pus. You have a fever that does not go away or a cough that does not get better after 2-3 weeks. You cannot control your cough with medicine, and you are losing sleep. You have pain that gets worse or is not helped with medicine. You lose weight for no clear reason. You have night sweats. Get help right away if: You cough up blood. You have trouble breathing. Your heart is beating very fast. These symptoms may be an emergency. Get help right away. Call 911. Do not wait to see if the symptoms will go away. Do not drive yourself to the  hospital. This information is not intended to replace advice given to you by your health care provider. Make sure you discuss any questions you have with your health care provider. Document Revised: 03/21/2022 Document Reviewed: 03/21/2022 Elsevier Patient Education  2024 Elsevier Inc.     Signed,   Meredith Staggers, MD [facility], [facility] 07/30/23 9:35 AM

## 2023-07-31 NOTE — Telephone Encounter (Signed)
Please see documentation in alternate 12/26 telephone encounter

## 2023-08-04 ENCOUNTER — Telehealth: Payer: Self-pay

## 2023-08-04 ENCOUNTER — Other Ambulatory Visit: Payer: Self-pay | Admitting: Family Medicine

## 2023-08-04 DIAGNOSIS — R058 Other specified cough: Secondary | ICD-10-CM

## 2023-08-04 NOTE — Telephone Encounter (Signed)
 He was started on azithromycin  on December 26th, with plan to start that medication if his cough was not improving within the next week.  Even if he started that medication the same day on the 26th, it would be too soon to repeat the antibiotic.  Although that medication is only taken for 5 days, it lasts much longer in his system, similar to another antibiotic that would have been taken for more days.  If he is having worsening symptoms I recommend evaluation with any provider, but if symptoms are stable or improving I would give it more time.  Let me know if there are further questions.

## 2023-08-04 NOTE — Telephone Encounter (Signed)
 Copied from CRM [id number]. Topic: Clinical - Prescription Issue >> Aug 04, 2023 11:36 AM Antonio H wrote: Reason for CRM: Pt called to check status of refill for azithromycin  (ZITHROMAX ) 250 MG tablet, he is needing a second round. advised him of turn around time for refills but he stated if he's unable to get it today, there will be no point in getting it refilled because he would have to start over on the antibiotic

## 2023-08-04 NOTE — Telephone Encounter (Signed)
Pt requesting refill antibiotic.

## 2023-08-04 NOTE — Telephone Encounter (Signed)
 There was a miscommunication with the messages taken, pt has not taken the med yet he was asking for us  to send in the prescription as [facility] stated they did not have it in the system I explained this was a paper prescription that we had handed him at check out an he can take that to the pharmacy and they will fill it for him pt voiced understanding and will take this with him to the pharmacy.

## 2023-08-06 ENCOUNTER — Encounter: Payer: Self-pay | Admitting: Diagnostic Neuroimaging

## 2023-08-06 ENCOUNTER — Encounter: Payer: Self-pay | Admitting: Family Medicine

## 2023-08-06 MED ORDER — ATORVASTATIN CALCIUM 40 MG PO TABS: 40.0000 mg | ORAL_TABLET | Freq: Every day | ORAL | 0 refills | Status: DC

## 2023-08-06 NOTE — Telephone Encounter (Signed)
 Error

## 2023-08-07 ENCOUNTER — Encounter: Payer: Self-pay | Admitting: Family Medicine

## 2023-08-07 DIAGNOSIS — R058 Other specified cough: Secondary | ICD-10-CM

## 2023-08-07 NOTE — Telephone Encounter (Signed)
 Pt notes cough worsening despite taking cough med and abx, please advise further actions

## 2023-08-10 MED ORDER — GUAIFENESIN-CODEINE 100-10 MG/5ML PO SOLN: 5.0000 mL | Freq: Four times a day (QID) | ORAL | 0 refills | Status: DC | PRN

## 2023-08-10 NOTE — Telephone Encounter (Signed)
 Please call patient and check status this morning.  As long as he is not having any fevers, shortness of breath and not continuing to worsen, I can send in a cough suppressant for him.  If he has not improved since Friday would prefer to have a chest x-ray and can have that performed today if needed.  Let me know.

## 2023-08-10 NOTE — Telephone Encounter (Signed)
 Pt has not had a fever, notes no SOB but difficulty sleeping, pt would like to use [facility] for pharmacy

## 2023-08-10 NOTE — Telephone Encounter (Signed)
 Called pt - cough on and off.  Cough woke up from sleep last week. Better past 2 days.  No fever/dyspnea.  Completed azithromycin  yesterday.  Cough at night at times.  Using tessalon  and mucinex  for cough - min relief.  Only rare need for inhaler  - sometimes at night.   Continue symptomatic care discussed, will add codeine  cough syrup.  Potential side effects discussed.  Albuterol  only if needed but if he does require albuterol  more frequently, RTC precautions discussed and caution with using codeine  cough syrup if wheezing.  Recheck in the next 5 days if not continuing to improve, sooner if worse.

## 2023-08-18 ENCOUNTER — Ambulatory Visit: Payer: Managed Care, Other (non HMO) | Attending: Cardiovascular Disease

## 2023-08-18 DIAGNOSIS — I639 Cerebral infarction, unspecified: Secondary | ICD-10-CM

## 2023-08-19 NOTE — Progress Notes (Deleted)
[facility] [address] [place]. [place] [address] [phone]       HOSPITAL FOLLOW UP NOTE  [name] Date of Birth:  22-Jul-1976 Medical Record Number:  [id number]   Reason for Referral:  hospital stroke follow up    SUBJECTIVE:   CHIEF COMPLAINT:  No chief complaint on file.   HPI:   Brian Reid is a 48 y.o. who  has a past medical history of Asthma.  Patient presented on 07/06/2023 with episode of right arm weakness, tremoring, right arm and right leg numbness. Patient did drive himself to the ED. He also reported feeling very anxious at this time. MRI showed acute/early subacute ischemia at left precentral and postcentral gyri junction. C-spine MRI showed bilateral mild spinal canal and foraminal stenosis. Asa 81mg  and Plavix started for 3 weeks then asa alone. PT recommended outpatient therapy. Personally reviewed hospitalization pertinent progress notes, lab work and imaging.  Evaluated by Dr Pearlean Brownie.   Since discharge,   PT eval showed no weakness. He reports symptoms resolved in about 4-5 days.   Heart monitor placed outpatient.   PCP added amlodipine 2.5mg  daily. A1C repeated and 6.8.    PERTINENT IMAGING/LABS  CT Head without contrast(Personally reviewed): CTH was negative for a large hypodensity concerning for a large territory infarct or hyperdensity concerning for an ICH CT angio Head and Neck with contrast(Personally reviewed): No LVO, no high grade stenosis.   MRI   Focus of acute/early subacute ischemia at the junction of the left precentral and postcentral gyri. 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  Moderate bilateral C7 and mild right C5, C6 neural foraminal stenosis.   VAS [place] LE DVT: Negative for DVT VAS [place] TCD w/ Bubble Study: Positive for small right-to-left shunt likely clinically insignificant 2D Echo: LVEF EF 60 to 65%, no shunt. TTE with EF 60 to 65% with no intra-atrial shunt  Recommend  30-day heart monitor   A1C Lab Results  Component Value Date   HGBA1C 6.8 (H) 07/20/2023    Lipid Panel     Component Value Date/Time   CHOL 206 (H) 07/07/2023 0640   CHOL 226 (H) 10/12/2020 1007   TRIG 333 (H) 07/07/2023 0640   HDL 30 (L) 07/07/2023 0640   HDL 37 (L) 10/12/2020 1007   CHOLHDL 6.9 07/07/2023 0640   VLDL 67 (H) 07/07/2023 0640   LDLCALC 109 (H) 07/07/2023 0640   LDLCALC 161 (H) 10/12/2020 1007   LDLDIRECT 119.0 03/20/2023 1140   LABVLDL 28 10/12/2020 1007      ROS:   14 system review of systems performed and negative with exception of those listed in HPI  PMH:  Past Medical History:  Diagnosis Date   Asthma     PSH:  Past Surgical History:  Procedure Laterality Date   APPENDECTOMY N/A    Phreesia 06/27/2020   LAPAROSCOPIC APPENDECTOMY N/A 04/02/2016   Procedure: APPENDECTOMY LAPAROSCOPIC;  Surgeon: Chevis Pretty III, MD;  Location: [facility];  Service: General;  Laterality: N/A;   LEG SURGERY     age 57   OPEN REDUCTION SHOULDER DISLOCATION     age 67   TRANSESOPHAGEAL ECHOCARDIOGRAM (CATH LAB) N/A 07/16/2023   Procedure: TRANSESOPHAGEAL ECHOCARDIOGRAM;  Surgeon: Thomasene Ripple, DO;  Location: [facility];  Service: Cardiovascular;  Laterality: N/A;    Social History:  Social History   Socioeconomic History   Marital status: Married    Spouse name: Pattricia Boss   Number of children:  2   Years of education: post grad   Highest education level: Master's degree (e.g., MA, MS, MEng, MEd, MSW, MBA)  Occupational History    Comment: IT professional  Tobacco Use   Smoking status: Former   Smokeless tobacco: Never   Tobacco comments:    03/05/16 "very occasional now", 06/06/16 not smoking  Vaping Use   Vaping status: Never Used  Substance and Sexual Activity   Alcohol use: Yes    Alcohol/week: 2.0 standard drinks of alcohol    Types: 2 Standard drinks or equivalent per week    Comment: social   Drug use: No   Sexual activity: Yes  Other Topics  Concern   Not on file  Social History Narrative   Lives with wife and two kids   Drinks 1 cup of coffee a day    Social Drivers of Corporate investment banker Strain: Low Risk  (06/04/2023)   Overall Financial Resource Strain (CARDIA)    Difficulty of Paying Living Expenses: Not hard at all  Food Insecurity: No Food Insecurity (07/07/2023)   Hunger Vital Sign    Worried About Running Out of Food in the Last Year: Never true    Ran Out of Food in the Last Year: Never true  Transportation Needs: No Transportation Needs (07/07/2023)   PRAPARE - Administrator, Civil Service (Medical): No    Lack of Transportation (Non-Medical): No  Physical Activity: Sufficiently Active (06/04/2023)   Exercise Vital Sign    Days of Exercise per Week: 4 days    Minutes of Exercise per Session: 40 min  Stress: No Stress Concern Present (06/04/2023)   [employer] of Occupational Health - Occupational Stress Questionnaire    Feeling of Stress : Not at all  Social Connections: Socially Integrated (06/04/2023)   Social Connection and Isolation Panel [NHANES]    Frequency of Communication with Friends and Family: More than three times a week    Frequency of Social Gatherings with Friends and Family: Twice a week    Attends Religious Services: More than 4 times per year    Active Member of [employer] or Organizations: Yes    Attends Engineer, structural: More than 4 times per year    Marital Status: Married  Catering manager Violence: Not At Risk (07/08/2023)   Humiliation, Afraid, Rape, and Kick questionnaire    Fear of Current or Ex-Partner: No    Emotionally Abused: No    Physically Abused: No    Sexually Abused: No    Family History:  Family History  Problem Relation Age of Onset   Diabetes Mother    Hypertension Mother    Stroke Mother 64   Diabetes Father    Heart disease Father 37       Heart attack with stent   Hypertension Sister     Medications:   Current  Outpatient Medications on File Prior to Visit  Medication Sig Dispense Refill   albuterol (VENTOLIN HFA) 108 (90 Base) MCG/ACT inhaler Inhale 1-2 puffs into the lungs every 6 (six) hours as needed for wheezing or shortness of breath. 8 g 0   amLODipine (NORVASC) 2.5 MG tablet Take 1 tablet (2.5 mg total) by mouth daily. 180 tablet 3   aspirin EC 81 MG tablet Take 1 tablet (81 mg total) by mouth daily. Swallow whole. 90 tablet 0   atorvastatin (LIPITOR) 40 MG tablet Take 1 tablet (40 mg total) by mouth daily. 90 tablet 0  benzonatate (TESSALON) 100 MG capsule Take 1 capsule (100 mg total) by mouth 3 (three) times daily as needed for cough. 20 capsule 0   cyclobenzaprine (FLEXERIL) 5 MG tablet Take 0.5-1 tablets (2.5-5 mg total) by mouth at bedtime as needed for muscle spasms. 15 tablet 1   guaiFENesin-codeine 100-10 MG/5ML syrup Take 5-10 mLs by mouth every 6 (six) hours as needed for cough. 120 mL 0   metFORMIN (GLUCOPHAGE) 500 MG tablet Take 1 tablet (500 mg total) by mouth 2 (two) times daily with a meal. 1-2 times per day as tolerated. 180 tablet 1   No current facility-administered medications on file prior to visit.    Allergies:  No Known Allergies    OBJECTIVE:  Physical Exam  There were no vitals filed for this visit. There is no height or weight on file to calculate BMI. No results found.     06/04/2023    1:05 PM  Depression screen PHQ 2/9  Decreased Interest 0  Down, Depressed, Hopeless 0  PHQ - 2 Score 0  Altered sleeping 0  Tired, decreased energy 0  Change in appetite 0  Feeling bad or failure about yourself  0  Trouble concentrating 0  Moving slowly or fidgety/restless 0  Suicidal thoughts 0  PHQ-9 Score 0  Difficult doing work/chores Not difficult at all     General: well developed, well nourished, seated, in no evident distress Head: head normocephalic and atraumatic.   Neck: supple with no carotid or supraclavicular bruits Cardiovascular: regular rate  and rhythm, no murmurs Musculoskeletal: no deformity Skin:  no rash/petichiae Vascular:  Normal pulses all extremities   Neurologic Exam Mental Status: Awake and fully alert.  Fluent speech and language.  Oriented to place and time. Recent and remote memory intact. Attention span, concentration and fund of knowledge appropriate. Mood and affect appropriate.  Cranial Nerves: Fundoscopic exam reveals sharp disc margins. Pupils equal, briskly reactive to light. Extraocular movements full without nystagmus. Visual fields full to confrontation. Hearing intact. Facial sensation intact. Face, tongue, palate moves normally and symmetrically.  Motor: Normal bulk and tone. Normal strength in all tested extremity muscles Sensory.: intact to touch , pinprick , position and vibratory sensation.  Coordination: Rapid alternating movements normal in all extremities. Finger-to-nose and heel-to-shin performed accurately bilaterally. Gait and Station: Arises from chair without difficulty. Stance is normal. Gait demonstrates normal stride length and balance with ***. Tandem walk and heel toe ***.  Reflexes: 1+ and symmetric.    NIHSS  *** Modified Rankin  ***    ASSESSMENT: Brian Reid is a 48 y.o. year old male presenting 07/06/2023 with episode of right arm weakness, tremoring, right arm and right leg numbness. Vascular risk factors include HLD, DMT2, ETOH, Obesity, family hx stroke (mother).      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 Continue aspirin 81 mg daily  and atorvastatin 40mg  daily for secondary stroke prevention.  Discussed secondary stroke prevention measures and importance of close PCP follow up for aggressive stroke risk factor management. I have gone over the pathophysiology of stroke, warning signs and symptoms, risk factors and their management in some detail with  instructions to go to the closest emergency room for symptoms of concern. HTN: BP goal <130/90.  Stable on no HTN agents per PCP HLD: LDL goal <70. Recent LDL 109. Continue atorvastatin 40mg  daily per PCP.  DMII: A1c goal<7.0. Recent A1c 6.8. Continue metformin  500mg  twice daily per PCP. Continue healthy, low carb diet.  Obesity: continue working on healthy lifestyle habits. Regular physical exercise and well balanced diet advised.    Follow up in *** or call earlier if needed   CC:  [facility] provider: Dr. Pearlean Brownie PCP: [name], MD    I spent *** minutes of face-to-face and non-face-to-face time with patient.  This included previsit chart review including review of recent hospitalization, lab review, study review, order entry, electronic health record documentation, patient education regarding recent stroke including etiology, secondary stroke prevention measures and importance of managing stroke risk factors, residual deficits and typical recovery time and answered all other questions to patient satisfaction   Shawnie Dapper, [facility]  [facility] [address] [place], [place] [address]  Phone [phone] Fax [phone] Note: This document was prepared with digital dictation and possible smart phrase technology. Any transcriptional errors that result from this process are unintentional.

## 2023-08-20 ENCOUNTER — Inpatient Hospital Stay: Payer: Managed Care, Other (non HMO) | Admitting: Family Medicine

## 2023-08-20 ENCOUNTER — Telehealth: Payer: Self-pay | Admitting: Family Medicine

## 2023-08-20 NOTE — Telephone Encounter (Signed)
Patient arrived late for 8am appt. Offered to work in pending 8:30 appt. He declined and rescheduled for 1/21 with Sarah. Called him at 9am offering 10:30 appt, today. He has not returned call. May offer sooner appt if one becomes available.

## 2023-08-21 ENCOUNTER — Ambulatory Visit: Payer: Managed Care, Other (non HMO) | Admitting: Family Medicine

## 2023-08-21 VITALS — BP 126/70 | HR 68 | Temp 98.1°F | Ht 70.0 in | Wt 222.0 lb

## 2023-08-21 DIAGNOSIS — R252 Cramp and spasm: Secondary | ICD-10-CM

## 2023-08-21 DIAGNOSIS — M542 Cervicalgia: Secondary | ICD-10-CM

## 2023-08-21 DIAGNOSIS — R052 Subacute cough: Secondary | ICD-10-CM | POA: Diagnosis not present

## 2023-08-21 DIAGNOSIS — J069 Acute upper respiratory infection, unspecified: Secondary | ICD-10-CM | POA: Diagnosis not present

## 2023-08-21 NOTE — Patient Instructions (Addendum)
Gentle range of motion and stretching, including after exercise for neck issues. Warm compress is ok if needed, and low dose flexeril if needed. Let me know if not improving.  If the episodic cramps are not improving as they have previously with restart of exercise, or any worsening symptoms please return for recheck, otherwise I can see you in 2 months for repeat labs.  Try flonase to see if nasal congestion is cause of cough. Can also try over the counter pepcid or prilosec to see if heartburn may be issue due to cough after eating.   Return to the clinic or go to the nearest emergency room if any of your symptoms worsen or new symptoms occur.

## 2023-08-21 NOTE — Progress Notes (Unsigned)
Subjective:  Patient ID: Brian Reid, male    DOB: September 28, 1975  Age: 48 y.o. MRN: [id number]  CC:  Chief Complaint  Patient presents with   Pain    Pt here for follow up neck and arm pain are okay not resolved but better    Cough    Cough still present mostly triggered by eating but has still been improving     HPI Ekin Territo presents for   Neck/arm pain Discussed at his December 16 visit, follow-up of CVA at that time.  Likely degenerative changes of cervical spine and spinal stenosis as contributor to pain.  Low-dose muscle relaxant with Flexeril 5 mg given with option of PT or neurosurgery/Ortho eval if persistent. Symptoms have improved, not completely resolved. Has not taken flexeril as not that bothersome. Has started light workouts in past week - some recurrence of slight neck pain.  No treatment. Minimal sx's. Has tried PT in past. No arm weakness, tinging in upper R back at times. No weakness. Declines PT or specialist eval at this time.  Cramps with exercise at times - better in past once back into exercise. Similar as when he stopped and restarted exercise in past.   Cough Treated December 26.  Initial nasal congestion followed by cough, intermittent wheeze possible.  Suspected viral illness but option to start azithromycin if not improving within a week, Tessalon Perles and albuterol provided as well.  Codeine cough syrup called in on January 6. Cough better. No sleep disturbance.  Some cough with talking or after eating. No heartburn felt, but tight sensation in throat at times after eating. Throat clearing after talking. Some nasal congestion at times. No nasal spray needed at this time.    History Patient Active Problem List   Diagnosis Date Noted   TIA (transient ischemic attack) 07/06/2023   Elevated BP without diagnosis of hypertension 07/06/2023   Pain in left hand 09/09/2021   Left forearm pain 04/12/2021   Acute appendicitis s/p lap appendectomy 04/02/2016  04/03/2016   Past Medical History:  Diagnosis Date   Asthma    Past Surgical History:  Procedure Laterality Date   APPENDECTOMY N/A    Phreesia 06/27/2020   LAPAROSCOPIC APPENDECTOMY N/A 04/02/2016   Procedure: APPENDECTOMY LAPAROSCOPIC;  Surgeon: Chevis Pretty III, MD;  Location: [facility];  Service: General;  Laterality: N/A;   LEG SURGERY     age 85   OPEN REDUCTION SHOULDER DISLOCATION     age 61   TRANSESOPHAGEAL ECHOCARDIOGRAM (CATH LAB) N/A 07/16/2023   Procedure: TRANSESOPHAGEAL ECHOCARDIOGRAM;  Surgeon: Thomasene Ripple, DO;  Location: [facility];  Service: Cardiovascular;  Laterality: N/A;   No Known Allergies Prior to Admission medications   Medication Sig Start Date End Date Taking? Authorizing Provider  albuterol (VENTOLIN HFA) 108 (90 Base) MCG/ACT inhaler Inhale 1-2 puffs into the lungs every 6 (six) hours as needed for wheezing or shortness of breath. 07/30/23  Yes [name], MD  amLODipine (NORVASC) 2.5 MG tablet Take 1 tablet (2.5 mg total) by mouth daily. 07/10/23 10/08/23 Yes Rollene Rotunda, MD  aspirin EC 81 MG tablet Take 1 tablet (81 mg total) by mouth daily. Swallow whole. 07/09/23 10/07/23 Yes [place], Eric J, DO  atorvastatin (LIPITOR) 40 MG tablet Take 1 tablet (40 mg total) by mouth daily. 08/06/23 11/04/23 Yes Rollene Rotunda, MD  benzonatate (TESSALON) 100 MG capsule Take 1 capsule (100 mg total) by mouth 3 (three) times daily as needed for cough. 07/30/23  Yes [name], MD  cyclobenzaprine (FLEXERIL) 5 MG tablet Take 0.5-1 tablets (2.5-5 mg total) by mouth at bedtime as needed for muscle spasms. 07/20/23  Yes [name], MD  guaiFENesin-codeine 100-10 MG/5ML syrup Take 5-10 mLs by mouth every 6 (six) hours as needed for cough. 08/10/23  Yes [name], MD  metFORMIN (GLUCOPHAGE) 500 MG tablet Take 1 tablet (500 mg total) by mouth 2 (two) times daily with a meal. 1-2 times per day as tolerated. 05/18/23  Yes [name], MD   Social  History   Socioeconomic History   Marital status: Married    Spouse name: Pattricia Boss   Number of children: 2   Years of education: post grad   Highest education level: Master's degree (e.g., MA, MS, MEng, MEd, MSW, MBA)  Occupational History    Comment: IT professional  Tobacco Use   Smoking status: Former   Smokeless tobacco: Never   Tobacco comments:    03/05/16 "very occasional now", 06/06/16 not smoking  Vaping Use   Vaping status: Never Used  Substance and Sexual Activity   Alcohol use: Yes    Alcohol/week: 2.0 standard drinks of alcohol    Types: 2 Standard drinks or equivalent per week    Comment: social   Drug use: No   Sexual activity: Yes  Other Topics Concern   Not on file  Social History Narrative   Lives with wife and two kids   Drinks 1 cup of coffee a day    Social Drivers of Corporate investment banker Strain: Low Risk  (06/04/2023)   Overall Financial Resource Strain (CARDIA)    Difficulty of Paying Living Expenses: Not hard at all  Food Insecurity: No Food Insecurity (07/07/2023)   Hunger Vital Sign    Worried About Running Out of Food in the Last Year: Never true    Ran Out of Food in the Last Year: Never true  Transportation Needs: No Transportation Needs (07/07/2023)   PRAPARE - Administrator, Civil Service (Medical): No    Lack of Transportation (Non-Medical): No  Physical Activity: Sufficiently Active (06/04/2023)   Exercise Vital Sign    Days of Exercise per Week: 4 days    Minutes of Exercise per Session: 40 min  Stress: No Stress Concern Present (06/04/2023)   [employer] of Occupational Health - Occupational Stress Questionnaire    Feeling of Stress : Not at all  Social Connections: Socially Integrated (06/04/2023)   Social Connection and Isolation Panel [NHANES]    Frequency of Communication with Friends and Family: More than three times a week    Frequency of Social Gatherings with Friends and Family: Twice a week    Attends  Religious Services: More than 4 times per year    Active Member of [employer] or Organizations: Yes    Attends Engineer, structural: More than 4 times per year    Marital Status: Married  Catering manager Violence: Not At Risk (07/08/2023)   Humiliation, Afraid, Rape, and Kick questionnaire    Fear of Current or Ex-Partner: No    Emotionally Abused: No    Physically Abused: No    Sexually Abused: No    Review of Systems   Objective:   Vitals:   08/21/23 0950  BP: 126/70  Pulse: 68  Temp: 98.1 F (36.7 C)  TempSrc: Temporal  SpO2: 98%  Weight: 222 lb (100.7 kg)  Height: 5\' 10"  (1.778 m)  Physical Exam Vitals reviewed.  Constitutional:      Appearance: He is well-developed.  HENT:     Head: Normocephalic and atraumatic.  Neck:     Vascular: No carotid bruit or JVD.  Cardiovascular:     Rate and Rhythm: Normal rate and regular rhythm.     Heart sounds: Normal heart sounds. No murmur heard. Pulmonary:     Effort: Pulmonary effort is normal.     Breath sounds: Normal breath sounds. No rales.  Musculoskeletal:     Right lower leg: No edema.     Left lower leg: No edema.     Comments: Some limitation in motion of C-spine with slight discomfort left lateral flexion, left rotation.  No radicular symptoms in arms.  Equal arm strength.  Skin:    General: Skin is warm and dry.  Neurological:     Mental Status: He is alert and oriented to person, place, and time.  Psychiatric:        Mood and Affect: Mood normal.        Assessment & Plan:  Lukka Prinkey is a 48 y.o. male . Upper respiratory tract infection, unspecified type Subacute cough  -Cough overall improved.  May have component of upper airway cough syndrome with either postnasal drip or irritation from reflux with association from eating.  Recommended Flonase nasal spray as well as Pepcid or Prilosec over-the-counter initially then if symptoms improved can stop 1 or the other to primary cause.  RTC  precautions given.  Neck pain  -Likely degenerative disc disease component.  PT discussed, declined any specific treatment at this time, will continue home exercises, stretches, symptomatic care with RTC precautions given.  Flexeril low-dose if needed.  Muscle cramp Reports similar symptoms in the past with restarting exercise after hiatus.  He plans to continue to monitor with RTC precautions given if persistent or new cramps  No orders of the defined types were placed in this encounter.  Patient Instructions  Gentle range of motion and stretching, including after exercise for neck issues. Warm compress is ok if needed, and low dose flexeril if needed. Let me know if not improving.  If the episodic cramps are not improving as they have previously with restart of exercise, or any worsening symptoms please return for recheck, otherwise I can see you in 2 months for repeat labs.  Try flonase to see if nasal congestion is cause of cough. Can also try over the counter pepcid or prilosec to see if heartburn may be issue due to cough after eating.   Return to the clinic or go to the nearest emergency room if any of your symptoms worsen or new symptoms occur.     Signed,   Meredith Staggers, MD [facility], [facility] 08/21/23 11:05 AM

## 2023-08-23 ENCOUNTER — Encounter: Payer: Self-pay | Admitting: Family Medicine

## 2023-08-24 ENCOUNTER — Encounter: Payer: Self-pay | Admitting: *Deleted

## 2023-08-25 ENCOUNTER — Ambulatory Visit (INDEPENDENT_AMBULATORY_CARE_PROVIDER_SITE_OTHER): Payer: Managed Care, Other (non HMO) | Admitting: Neurology

## 2023-08-25 ENCOUNTER — Encounter: Payer: Self-pay | Admitting: Neurology

## 2023-08-25 VITALS — BP 135/80 | HR 78 | Ht 70.0 in | Wt 224.6 lb

## 2023-08-25 DIAGNOSIS — R0683 Snoring: Secondary | ICD-10-CM | POA: Diagnosis not present

## 2023-08-25 DIAGNOSIS — E785 Hyperlipidemia, unspecified: Secondary | ICD-10-CM | POA: Diagnosis not present

## 2023-08-25 DIAGNOSIS — I639 Cerebral infarction, unspecified: Secondary | ICD-10-CM | POA: Insufficient documentation

## 2023-08-25 DIAGNOSIS — I10 Essential (primary) hypertension: Secondary | ICD-10-CM | POA: Diagnosis not present

## 2023-08-25 HISTORY — DX: Hyperlipidemia, unspecified: E78.5

## 2023-08-25 HISTORY — DX: Essential (primary) hypertension: I10

## 2023-08-25 NOTE — Patient Instructions (Addendum)
Continue aspirin 81 mg daily  Referral for sleep consult to evaluate for OSA Strict management of vascular risk factors with a goal BP less than 130/90, A1c less than 7.0, LDL less than 70 for secondary stroke prevention Follow up with orthopedics about mild cervical stenosis, right shoulder  Continue to see your primary care doctor Follow up here as needed

## 2023-08-25 NOTE — Progress Notes (Signed)
Patient: Brian Reid Date of Birth: 24-Jul-1976  Reason for Visit: Stroke Clinic Follow Up History from: Patient Primary Neurologist: Pearlean Brownie   ASSESSMENT AND PLAN 48 y.o. 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  30-day cardiac monitor study was negative for A-fib.  Vascular bubble study showed small right-to-left shunt likely clinically insignificant. Outpatient TEE  showing EF 60 to 65%, cannot exclude small PFO, agitated saline contrast bubble study was positive with shunting observed within 3-6 cardiac cycles suggestive of interatrial shunt  Vascular risk factors: HTN, HLD, type II DM, former tobacco abuse.  Mild spinal canal stenosis at C4-5, C5-6 and C6/7.  Moderate bilateral C7 and mild right C5, C6 neural foraminal stenosis.  Has mild residual paresthesia to the right arm.  -Continue aspirin 81 mg daily for secondary stroke prevention -Referral for sleep consult to rule out OSA in the setting of cryptogenic stroke -Continue close follow-up with primary care strict management of vascular risk factors with a goal BP less than 130/90, A1c less than 7.0, LDL less than 70 for secondary stroke prevention -Follow-up with orthopedist, potentially some contribution to right sided paresthesia from mild cervical canal stenosis.  Left occipital neuralgia. -Seen cardiology in March, TEE cannot exclude small PFO -Recommend healthy eating, exercise, aggressive risk factor modification -Follow-up at our office on an as-needed basis  HISTORY OF PRESENT ILLNESS: Today 08/25/23 Here today alone. Remains on aspirin 81 mg daily, finished DAPT. Has resumed work outs 3 times a week, after feeling some aching more to right side. Prior to stroke for years has had tingling to right hand, loosens up. In the shower feelings tingling to right arm when water hits, otherwise back to normal. Sometimes left sided occipital neuralgia, in the  past going to [facility] Dr. Maurice Small, never had injections. Has not been evaluated for OSA, snores, goes to bed late around midnight. Has daytime fatigue. Works remotely in Consulting civil engineer, travels fairly often. Mentions surgery to right shoulder in the past, has issues with screw, some limitation. 07/20/23 A1c 6.8 on metformin.  Remains on Lipitor 40.  Takes amlodipine 2.5 mg.  Keeping BP log.  Today manual 135/80.  Had not taken medication yet.  HISTORY  Previous patient of Dr. Marjory Lies April 2022 for left occipital neuralgia/neck pain.  Went to the ER 07/06/2023 with episode of right arm weakness and tremor, right leg numbness.  MRI showed acute/early subacute ischemia at left precentral and postcentral gyri junction.  MRI cervical spine showed bilateral mild spinal canal stenosis and foraminal stenosis.  NIH 2.  Upon hospital discharge some residual paresthesia.  -CT head was normal -CTA head and neck no LVO -MRI of the brain showed focus of acute/early subacute ischemia at the junction of the left precentral and postcentral gyri -MRI cervical spine mild spinal canal stenosis at C4-5, C5-6 and C6/7.  Moderate bilateral C7 and mild right C5, C6 neural foraminal stenosis. -Vascular ultrasound LE: Negative for DVT -Vascular ultrasound TCD with bubble study-positive for small right-to-left shunt to likely clinically insignificant -2D echo EF 60 to 65%, no shunt -Recommended TEE as outpatient, showing EF 60 to 65%, cannot exclude small PFO, agitated saline contrast bubble study was positive with shunting observed within 3-6 cardiac cycles suggestive of interatrial shunt. -Recommended 30-day cardiac monitor, negative for AFIB -LDL 109, continue Lipitor 40 -A1c 7.6 -No antithrombotic prior to admission, 3 weeks DAPT aspirin 81 mg daily and Plavix 75 mg daily, then aspirin alone -Recommended  outpatient screening for OSA.  REVIEW OF SYSTEMS: Out of a complete 14 system review of symptoms, the patient  complains only of the following symptoms, and all other reviewed systems are negative.  See HPI  ALLERGIES: No Known Allergies  HOME MEDICATIONS: Outpatient Medications Prior to Visit  Medication Sig Dispense Refill   amLODipine (NORVASC) 2.5 MG tablet Take 1 tablet (2.5 mg total) by mouth daily. 180 tablet 3   aspirin EC 81 MG tablet Take 1 tablet (81 mg total) by mouth daily. Swallow whole. 90 tablet 0   atorvastatin (LIPITOR) 40 MG tablet Take 1 tablet (40 mg total) by mouth daily. 90 tablet 0   metFORMIN (GLUCOPHAGE) 500 MG tablet Take 1 tablet (500 mg total) by mouth 2 (two) times daily with a meal. 1-2 times per day as tolerated. 180 tablet 1   albuterol (VENTOLIN HFA) 108 (90 Base) MCG/ACT inhaler Inhale 1-2 puffs into the lungs every 6 (six) hours as needed for wheezing or shortness of breath. 8 g 0   benzonatate (TESSALON) 100 MG capsule Take 1 capsule (100 mg total) by mouth 3 (three) times daily as needed for cough. 20 capsule 0   cyclobenzaprine (FLEXERIL) 5 MG tablet Take 0.5-1 tablets (2.5-5 mg total) by mouth at bedtime as needed for muscle spasms. 15 tablet 1   guaiFENesin-codeine 100-10 MG/5ML syrup Take 5-10 mLs by mouth every 6 (six) hours as needed for cough. 120 mL 0   No facility-administered medications prior to visit.    PAST MEDICAL HISTORY: Past Medical History:  Diagnosis Date   Asthma     PAST SURGICAL HISTORY: Past Surgical History:  Procedure Laterality Date   APPENDECTOMY N/A    Phreesia 06/27/2020   LAPAROSCOPIC APPENDECTOMY N/A 04/02/2016   Procedure: APPENDECTOMY LAPAROSCOPIC;  Surgeon: Chevis Pretty III, MD;  Location: [facility];  Service: General;  Laterality: N/A;   LEG SURGERY     age 21   OPEN REDUCTION SHOULDER DISLOCATION     age 47   TRANSESOPHAGEAL ECHOCARDIOGRAM (CATH LAB) N/A 07/16/2023   Procedure: TRANSESOPHAGEAL ECHOCARDIOGRAM;  Surgeon: Thomasene Ripple, DO;  Location: [facility];  Service: Cardiovascular;  Laterality: N/A;     FAMILY HISTORY: Family History  Problem Relation Age of Onset   Diabetes Mother    Hypertension Mother    Stroke Mother 31   Diabetes Father    Heart disease Father 59       Heart attack with stent   Hypertension Sister     SOCIAL HISTORY: Social History   Socioeconomic History   Marital status: Married    Spouse name: Pattricia Boss   Number of children: 2   Years of education: post grad   Highest education level: Master's degree (e.g., MA, MS, MEng, MEd, MSW, MBA)  Occupational History    Comment: IT professional  Tobacco Use   Smoking status: Former   Smokeless tobacco: Never   Tobacco comments:    03/05/16 "very occasional now", 06/06/16 not smoking  Vaping Use   Vaping status: Never Used  Substance and Sexual Activity   Alcohol use: Yes    Alcohol/week: 2.0 standard drinks of alcohol    Types: 2 Standard drinks or equivalent per week    Comment: social   Drug use: No   Sexual activity: Yes  Other Topics Concern   Not on file  Social History Narrative   Lives with wife and two kids   Drinks 1 cup of coffee a day  Social Drivers of Corporate investment banker Strain: Low Risk  (06/04/2023)   Overall Financial Resource Strain (CARDIA)    Difficulty of Paying Living Expenses: Not hard at all  Food Insecurity: No Food Insecurity (07/07/2023)   Hunger Vital Sign    Worried About Running Out of Food in the Last Year: Never true    Ran Out of Food in the Last Year: Never true  Transportation Needs: No Transportation Needs (07/07/2023)   PRAPARE - Administrator, Civil Service (Medical): No    Lack of Transportation (Non-Medical): No  Physical Activity: Sufficiently Active (06/04/2023)   Exercise Vital Sign    Days of Exercise per Week: 4 days    Minutes of Exercise per Session: 40 min  Stress: No Stress Concern Present (06/04/2023)   [employer] of Occupational Health - Occupational Stress Questionnaire    Feeling of Stress : Not at all  Social  Connections: Socially Integrated (06/04/2023)   Social Connection and Isolation Panel [NHANES]    Frequency of Communication with Friends and Family: More than three times a week    Frequency of Social Gatherings with Friends and Family: Twice a week    Attends Religious Services: More than 4 times per year    Active Member of [employer] or Organizations: Yes    Attends Banker Meetings: More than 4 times per year    Marital Status: Married  Catering manager Violence: Not At Risk (07/08/2023)   Humiliation, Afraid, Rape, and Kick questionnaire    Fear of Current or Ex-Partner: No    Emotionally Abused: No    Physically Abused: No    Sexually Abused: No   PHYSICAL EXAM  Vitals:   08/25/23 0804 08/25/23 0832  BP: (!) 158/96 135/80  Pulse: 78   Weight: 224 lb 9.6 oz (101.9 kg)   Height: 5\' 10"  (1.778 m)    Body mass index is 32.23 kg/m.  Generalized: Well developed, in no acute distress  Neurological examination  Mentation: Alert oriented to time, place, history taking. Follows all commands speech and language fluent Cranial nerve II-XII: Pupils were equal round reactive to light. Extraocular movements were full, visual field were full on confrontational test. Facial sensation and strength were normal.  Head turning and shoulder shrug  were normal and symmetric. Motor: The motor testing reveals 5 over 5 strength of all 4 extremities. Good symmetric motor tone is noted throughout.  Sensory: Sensory testing is intact to soft touch on all 4 extremities. No evidence of extinction is noted.  Coordination: Cerebellar testing reveals good finger-nose-finger and heel-to-shin bilaterally.  Gait and station: Gait is normal. Tandem gait is normal.  Reflexes: Deep tendon reflexes are symmetric and normal bilaterally.   DIAGNOSTIC DATA (LABS, IMAGING, TESTING) - I reviewed patient records, labs, notes, testing and imaging myself where available.  Lab Results  Component Value Date    WBC 6.2 07/07/2023   HGB 13.7 07/07/2023   HCT 42.4 07/07/2023   MCV 77.7 (L) 07/07/2023   PLT 192 07/07/2023      Component Value Date/Time   NA 139 07/20/2023 1043   NA 140 10/12/2020 1007   K 4.0 07/20/2023 1043   CL 102 07/20/2023 1043   CO2 33 (H) 07/20/2023 1043   GLUCOSE 119 (H) 07/20/2023 1043   BUN 9 07/20/2023 1043   BUN 13 10/12/2020 1007   CREATININE 0.99 07/20/2023 1043   CREATININE 0.97 04/02/2016 1410   CALCIUM 9.5 07/20/2023 1043  PROT 6.9 07/20/2023 1043   PROT 6.9 10/12/2020 1007   ALBUMIN 4.5 07/20/2023 1043   ALBUMIN 4.6 10/12/2020 1007   AST 20 07/20/2023 1043   ALT 32 07/20/2023 1043   ALKPHOS 58 07/20/2023 1043   BILITOT 0.7 07/20/2023 1043   BILITOT 0.5 10/12/2020 1007   GFRNONAA >60 07/07/2023 0041   GFRNONAA >89 04/02/2016 1410   GFRAA >89 04/02/2016 1410   Lab Results  Component Value Date   CHOL 206 (H) 07/07/2023   HDL 30 (L) 07/07/2023   LDLCALC 109 (H) 07/07/2023   LDLDIRECT 119.0 03/20/2023   TRIG 333 (H) 07/07/2023   CHOLHDL 6.9 07/07/2023   Lab Results  Component Value Date   HGBA1C 6.8 (H) 07/20/2023   No results found for: "VITAMINB12" Lab Results  Component Value Date   TSH 2.10 02/26/2016    Margie Ege, AGNP-C, DNP 08/25/2023, 8:33 AM [facility] [address], [address] [place], [place] [address] [phone]

## 2023-08-26 NOTE — Progress Notes (Signed)
I agree with the above plan 

## 2023-09-15 ENCOUNTER — Institutional Professional Consult (permissible substitution): Payer: Managed Care, Other (non HMO) | Admitting: Neurology

## 2023-09-23 ENCOUNTER — Institutional Professional Consult (permissible substitution): Payer: Managed Care, Other (non HMO) | Admitting: Neurology

## 2023-09-28 ENCOUNTER — Other Ambulatory Visit: Payer: Self-pay | Admitting: Family Medicine

## 2023-09-28 DIAGNOSIS — R062 Wheezing: Secondary | ICD-10-CM

## 2023-10-07 NOTE — Progress Notes (Signed)
 Cardiology Clinic Note   Patient Name: Brian Reid Date of Encounter: 10/09/2023  Primary Care Provider:  [name], MD Primary Cardiologist:  Rollene Rotunda, MD  Patient Profile    Brian Reid 48 year old male presents to the clinic today for follow-up evaluation of his hypertension and hyperlipidemia.  Past Medical History    Past Medical History:  Diagnosis Date   Asthma    HLD (hyperlipidemia) 08/25/2023   HTN (hypertension) 08/25/2023   Past Surgical History:  Procedure Laterality Date   APPENDECTOMY N/A    Phreesia 06/27/2020   LAPAROSCOPIC APPENDECTOMY N/A 04/02/2016   Procedure: APPENDECTOMY LAPAROSCOPIC;  Surgeon: Chevis Pretty III, MD;  Location: [facility];  Service: General;  Laterality: N/A;   LEG SURGERY     age 21   OPEN REDUCTION SHOULDER DISLOCATION     age 57   TRANSESOPHAGEAL ECHOCARDIOGRAM (CATH LAB) N/A 07/16/2023   Procedure: TRANSESOPHAGEAL ECHOCARDIOGRAM;  Surgeon: Thomasene Ripple, DO;  Location: [facility];  Service: Cardiovascular;  Laterality: N/A;    Allergies  No Known Allergies  History of Present Illness    [name] has a PMH of HLD, HTN, CVA, type 2 diabetes, tobacco abuse, and acute appendicitis status post lap appendectomy 8/17.  He was seen in the emergency department 12-24 with right sided weakness.  His MRI showed acute/early subacute ischemia at the junction of the left precentral and postcentral gyri.  His TTE showed an EF of 60-65% which was positive for right-to-left shunt.  He was placed on aspirin and Plavix.  A TEE was recommended.  Sleep study was also recommended.  His A1c was noted to be 7.6.  When he was seen by Dr. Antoine Poche 07/10/2023 his blood pressure was elevated.  He continued with right-sided weakness.  He had not had any speech or vision changes.  He denied chest pain.  He denied shortness of breath PND and orthopnea.  He denied weight gain and lower extremity swelling.  He was doing some resistance training  for exercise.  He underwent TEE on 07/16/2023.  His EF was noted to be 60 to 65%.  Trivial mitral valve regurgitation was noted.  Could not exclude small PFO.  An agitated saline contrast bubble study was positive with shunting observed within 3-6 cardiac cycles suggestive of interatrial shunt.  He presents to the clinic today for follow-up evaluation and states he has been walking regularly and follows up with neurology.  They discussed the importance of sleep evaluation.  He reports compliance with his aspirin and amlodipine medication.  He denies side effects.  We reviewed his recent TEE and hospitalization.  He expressed understanding.  He has stopped smoking.  I will continue his current medication regimen, have him maintain his physical activity and plan follow-up in 6 months..  Today he denies chest pain, shortness of breath, lower extremity edema, fatigue, palpitations, melena, hematuria, hemoptysis, diaphoresis, weakness, presyncope, syncope, orthopnea, and PND.    Home Medications    Prior to Admission medications   Medication Sig Start Date End Date Taking? Authorizing Provider  albuterol (VENTOLIN HFA) 108 (90 Base) MCG/ACT inhaler INHALE 1-2 PUFFS BY MOUTH EVERY 6 HOURS AS NEEDED FOR WHEEZE OR SHORTNESS OF BREATH 09/28/23   [name], MD  amLODipine (NORVASC) 2.5 MG tablet Take 1 tablet (2.5 mg total) by mouth daily. 07/10/23 10/08/23  Rollene Rotunda, MD  aspirin EC 81 MG tablet Take 1 tablet (81 mg total) by mouth daily. Swallow whole. 07/09/23 10/07/23  [place], Eric J, DO  atorvastatin (LIPITOR) 40 MG tablet Take 1 tablet (40 mg total) by mouth daily. 08/06/23 11/04/23  Rollene Rotunda, MD  metFORMIN (GLUCOPHAGE) 500 MG tablet Take 1 tablet (500 mg total) by mouth 2 (two) times daily with a meal. 1-2 times per day as tolerated. 05/18/23   [name], MD    Family History    Family History  Problem Relation Age of Onset   Diabetes Mother    Hypertension Mother    Stroke  Mother 78   Diabetes Father    Heart disease Father 24       Heart attack with stent   Hypertension Sister    He indicated that his mother is deceased. He indicated that his father is alive. He indicated that his sister is alive. He indicated that his brother is alive.  Social History    Social History   Socioeconomic History   Marital status: Married    Spouse name: Pattricia Boss   Number of children: 2   Years of education: post grad   Highest education level: Master's degree (e.g., MA, MS, MEng, MEd, MSW, MBA)  Occupational History    Comment: IT professional  Tobacco Use   Smoking status: Former   Smokeless tobacco: Never   Tobacco comments:    03/05/16 "very occasional now", 06/06/16 not smoking  Vaping Use   Vaping status: Never Used  Substance and Sexual Activity   Alcohol use: Not Currently    Alcohol/week: 2.0 standard drinks of alcohol    Types: 2 Standard drinks or equivalent per week    Comment: social   Drug use: Never   Sexual activity: Yes    Partners: Female    Comment: married  Other Topics Concern   Not on file  Social History Narrative   Lives with wife and two kids   Drinks 1 cup of coffee a day    Social Drivers of Corporate investment banker Strain: Low Risk  (06/04/2023)   Overall Financial Resource Strain (CARDIA)    Difficulty of Paying Living Expenses: Not hard at all  Food Insecurity: No Food Insecurity (07/07/2023)   Hunger Vital Sign    Worried About Running Out of Food in the Last Year: Never true    Ran Out of Food in the Last Year: Never true  Transportation Needs: No Transportation Needs (07/07/2023)   PRAPARE - Administrator, Civil Service (Medical): No    Lack of Transportation (Non-Medical): No  Physical Activity: Sufficiently Active (06/04/2023)   Exercise Vital Sign    Days of Exercise per Week: 4 days    Minutes of Exercise per Session: 40 min  Stress: No Stress Concern Present (06/04/2023)   [employer] of  Occupational Health - Occupational Stress Questionnaire    Feeling of Stress : Not at all  Social Connections: Socially Integrated (06/04/2023)   Social Connection and Isolation Panel [NHANES]    Frequency of Communication with Friends and Family: More than three times a week    Frequency of Social Gatherings with Friends and Family: Twice a week    Attends Religious Services: More than 4 times per year    Active Member of [employer] or Organizations: Yes    Attends Banker Meetings: More than 4 times per year    Marital Status: Married  Catering manager Violence: Not At Risk (07/08/2023)   Humiliation, Afraid, Rape, and Kick questionnaire    Fear of Current or  Ex-Partner: No    Emotionally Abused: No    Physically Abused: No    Sexually Abused: No     Review of Systems    General:  No chills, fever, night sweats or weight changes.  Cardiovascular:  No chest pain, dyspnea on exertion, edema, orthopnea, palpitations, paroxysmal nocturnal dyspnea. Dermatological: No rash, lesions/masses Respiratory: No cough, dyspnea Urologic: No hematuria, dysuria Abdominal:   No nausea, vomiting, diarrhea, bright red blood per rectum, melena, or hematemesis Neurologic:  No visual changes, wkns, changes in mental status. All other systems reviewed and are otherwise negative except as noted above.  Physical Exam    VS:  BP 120/80   Pulse (!) 56   Ht 5\' 10"  (1.778 m)   Wt 223 lb 6.4 oz (101.3 kg)   SpO2 98%   BMI 32.05 kg/m  , BMI Body mass index is 32.05 kg/m. GEN: Well nourished, well developed, in no acute distress. HEENT: normal. Neck: Supple, no JVD, carotid bruits, or masses. Cardiac: RRR, no murmurs, rubs, or gallops. No clubbing, cyanosis, edema.  Radials/DP/PT 2+ and equal bilaterally.  Respiratory:  Respirations regular and unlabored, clear to auscultation bilaterally. GI: Soft, nontender, nondistended, BS + x 4. MS: no deformity or atrophy. Skin: warm and dry, no  rash. Neuro:  Strength and sensation are intact. Psych: Normal affect.  Accessory Clinical Findings    Recent Labs: 07/07/2023: Hemoglobin 13.7; Platelets 192 07/20/2023: ALT 32; BUN 9; Creatinine, Ser 0.99; Potassium 4.0; Sodium 139   Recent Lipid Panel    Component Value Date/Time   CHOL 206 (H) 07/07/2023 0640   CHOL 226 (H) 10/12/2020 1007   TRIG 333 (H) 07/07/2023 0640   HDL 30 (L) 07/07/2023 0640   HDL 37 (L) 10/12/2020 1007   CHOLHDL 6.9 07/07/2023 0640   VLDL 67 (H) 07/07/2023 0640   LDLCALC 109 (H) 07/07/2023 0640   LDLCALC 161 (H) 10/12/2020 1007   LDLDIRECT 119.0 03/20/2023 1140         ECG personally reviewed by me today- None today.    TEE 07/16/2023  IMPRESSIONS     1. Left ventricular ejection fraction, by estimation, is 60 to 65%. The  left ventricle has normal function. The left ventricle has no regional  wall motion abnormalities.   2. Right ventricular systolic function is normal. The right ventricular  size is normal.   3. No left atrial/left atrial appendage thrombus was detected.   4. The mitral valve is normal in structure. Trivial mitral valve  regurgitation. No evidence of mitral stenosis.   5. The aortic valve is normal in structure. Aortic valve regurgitation is  not visualized. No aortic stenosis is present.   6. Cannot exclude a small PFO. Agitated saline contrast bubble study was  positive with shunting observed within 3-6 cardiac cycles suggestive of  interatrial shunt.   Conclusion(s)/Recommendation(s): Normal biventricular function without  evidence of hemodynamically significant valvular heart disease.   FINDINGS   Left Ventricle: Left ventricular ejection fraction, by estimation, is 60  to 65%. The left ventricle has normal function. The left ventricle has no  regional wall motion abnormalities. The left ventricular internal cavity  size was normal in size. There is   no left ventricular hypertrophy.   Right Ventricle: The  right ventricular size is normal. No increase in  right ventricular wall thickness. Right ventricular systolic function is  normal.   Left Atrium: Left atrial size was normal in size. No left atrial/left  atrial appendage thrombus was  detected.   Right Atrium: Right atrial size was normal in size.   Pericardium: There is no evidence of pericardial effusion.   Mitral Valve: The mitral valve is normal in structure. Trivial mitral  valve regurgitation. No evidence of mitral valve stenosis.   Tricuspid Valve: The tricuspid valve is normal in structure. Tricuspid  valve regurgitation is not demonstrated. No evidence of tricuspid  stenosis.   Aortic Valve: The aortic valve is normal in structure. Aortic valve  regurgitation is not visualized. No aortic stenosis is present.   Pulmonic Valve: The pulmonic valve was normal in structure. Pulmonic valve  regurgitation is not visualized. No evidence of pulmonic stenosis.   Aorta: The aortic root is normal in size and structure.   Venous: The left upper pulmonary vein, left lower pulmonary vein, right  upper pulmonary vein and right lower pulmonary vein are normal.   IAS/Shunts: Cannot exclude a small PFO. Agitated saline contrast was given  intravenously to evaluate for intracardiac shunting. Agitated saline  contrast bubble study was positive with shunting observed within 3-6  cardiac cycles suggestive of interatrial  shunt.   Additional Comments: Spectral Doppler performed.    Cardiac event monitor 08/18/2023  Predominant rhythm was normal sinus Rare premature ventricular and supraventricular beates. No sustained arrhythmias No symptoms reported     Assessment & Plan   1.  Essential hypertension-BP today 120/80. Maintain blood pressure log Heart healthy low-sodium diet Continue amlodipine  CVA-neurologically intact.  Slight right-sided weakness.  Underwent TEE which showed small PFO could not be excluded with shunting  observed in 3-6 cardiac cycles.  Cardiac event monitor 08/18/2023 showed predominantly sinus rhythm.  Rare PVCs and supraventricular beats were noted.  No sustained arrhythmias noted. Continue aspirin Maintain good blood pressure control Following with neurology  Snoring-neurology has ordered a sleep study to rule out OSA. Avoid supine sleeping Elevate head of bed Sleep hygiene instructions  Hyperlipidemia-LDL 109 on 07/07/23. High-fiber diet Increase physical activity as tolerated Continue aspirin, atorvastatin  Tobacco abuse-has stopped smoking Congratulated on cessation  Disposition: Follow-up with Dr. Antoine Poche or me in 6 months.   Thomasene Ripple. Varonica Siharath NP-C     10/09/2023, 8:22 AM [facility] [address] Office [phone] Fax [phone]    I spent 14 minutes examining this patient, reviewing medications, and using patient centered shared decision making involving their cardiac care.   I spent  20 minutes reviewing past medical history,  medications, and prior cardiac tests.

## 2023-10-08 ENCOUNTER — Ambulatory Visit: Payer: Managed Care, Other (non HMO) | Admitting: Neurology

## 2023-10-08 VITALS — BP 124/80 | HR 68 | Ht 70.0 in | Wt 224.0 lb

## 2023-10-08 DIAGNOSIS — Z9189 Other specified personal risk factors, not elsewhere classified: Secondary | ICD-10-CM | POA: Diagnosis not present

## 2023-10-08 DIAGNOSIS — G4719 Other hypersomnia: Secondary | ICD-10-CM

## 2023-10-08 DIAGNOSIS — R0683 Snoring: Secondary | ICD-10-CM | POA: Diagnosis not present

## 2023-10-08 DIAGNOSIS — E66811 Obesity, class 1: Secondary | ICD-10-CM

## 2023-10-08 DIAGNOSIS — Z8673 Personal history of transient ischemic attack (TIA), and cerebral infarction without residual deficits: Secondary | ICD-10-CM

## 2023-10-08 DIAGNOSIS — R0681 Apnea, not elsewhere classified: Secondary | ICD-10-CM

## 2023-10-08 DIAGNOSIS — R519 Headache, unspecified: Secondary | ICD-10-CM

## 2023-10-08 NOTE — Progress Notes (Signed)
 Subjective:    Patient ID: Brian Reid is a 48 y.o. male.  HPI    Brian Foley, MD, PhD [facility] [address], [address] [address] [place], [place] [address]  Dear Brian Reid,  I saw your patient, Brian Reid, upon your kind request in my sleep clinic today for initial consultation of his sleep, concern for obstructive sleep apnea.  The patient is accompanied by his wife today.  As you know, Brian Reid is a 48 year old male with an underlying medical history of stroke, prior smoking, hypertension, hyperlipidemia, asthma, and obesity, who reports snoring and excessive daytime somnolence.  His Epworth sleepiness score is 2 out of 24 and fatigue severity score is 19 out of 63.  He reports daytime somnolence when he does not get enough sleep, less than 6 hours.  His wife has noted occasional pauses in his breathing while he is asleep.  Snoring can be disturbing to her.  He also has daytime fatigue and at times a dull, posterior headache when he gets less than 6 hours of sleep.  He goes to bed between 1030 and midnight and rise time is generally between 6:30 AM and 7.  He lives with his including wife and 2 children.  They have no pets in the household, no TV in the bedroom.  He works as an Scientist, product/process development, works from home.  He drinks caffeine in form of coffee in the morning, usually 1 cup and half a cup of tea during the afternoon.  He drinks soda occasionally.  He quit smoking in 2015.  He drinks alcohol occasionally.  From the stroke standpoint he feels back to normal or baseline.  He is working on weight loss and has lost about 15 pounds in the past 5 months.  He does not have nightly nocturia.  I reviewed your office note from 08/25/2023.  I had evaluated him several years ago at the request of Dr. Marjory Reid for sleep apnea concern.  He did not proceed with sleep testing at the time.  Previously:    03/27/2016: 48 year old right-handed gentleman with an underlying medical  history of neck pain, exertional headache, lumbar radiculopathy and obesity, who reports snoring and morning headaches. I reviewed your office note from 03/05/2016. He reports that in the last 3 weeks things have improved some as he started sleeping on the floor with a thin pillow. Neck pain is about the same however. He has some radiation to the top of the head and a abnormal sensation under her scalp sometimes. He still snores but not as loud he reports, he is not aware of any apneic pauses while asleep, his wife has not reported recently but has reported apneas in the past. He is restless but denies any restless leg syndrome, he used to wake up a lot with jerking of his body but not so much since he has been sleeping a little better on the ground. He works in [place], he has a 1 hour and 20 minute commute one-way typically, bedtime between 10:30 and 11:30 on average, wake time around 6:30. His Epworth sleepiness score is 4 out of 24 today, his fatigue score is 32 out of 63. He is not aware of any family history of OSA. He quit smoking in 2012, drinks alcohol very occasionally, coffee once a day. He denies any nighttime reflux, nocturia, or postnasal drip. He lives at home with his wife and 2 young children, ages 10 and 68. He works in Consulting civil engineer. He does not  typically like to take any naps.   His Past Medical History Is Significant For: Past Medical History:  Diagnosis Date   Asthma    HLD (hyperlipidemia) 08/25/2023   HTN (hypertension) 08/25/2023    His Past Surgical History Is Significant For: Past Surgical History:  Procedure Laterality Date   APPENDECTOMY N/A    [employer] 06/27/2020   LAPAROSCOPIC APPENDECTOMY N/A 04/02/2016   Procedure: APPENDECTOMY LAPAROSCOPIC;  Surgeon: Chevis Pretty III, MD;  Location: [facility];  Service: General;  Laterality: N/A;   LEG SURGERY     age 57   OPEN REDUCTION SHOULDER DISLOCATION     age 47   TRANSESOPHAGEAL ECHOCARDIOGRAM (CATH LAB) N/A 07/16/2023   Procedure:  TRANSESOPHAGEAL ECHOCARDIOGRAM;  Surgeon: Thomasene Ripple, DO;  Location: [facility] INVASIVE CV LAB;  Service: Cardiovascular;  Laterality: N/A;    His Family History Is Significant For: Family History  Problem Relation Age of Onset   Diabetes Mother    Hypertension Mother    Stroke Mother 85   Diabetes Father    Heart disease Father 24       Heart attack with stent   Hypertension Sister     His Social History Is Significant For: Social History   Socioeconomic History   Marital status: Married    Spouse name: Pattricia Boss   Number of children: 2   Years of education: post grad   Highest education level: Master's degree (e.g., MA, MS, MEng, MEd, MSW, MBA)  Occupational History    Comment: IT professional  Tobacco Use   Smoking status: Former   Smokeless tobacco: Never   Tobacco comments:    03/05/16 "very occasional now", 06/06/16 not smoking  Vaping Use   Vaping status: Never Used  Substance and Sexual Activity   Alcohol use: Yes    Alcohol/week: 2.0 standard drinks of alcohol    Types: 2 Standard drinks or equivalent per week    Comment: social   Drug use: No   Sexual activity: Yes  Other Topics Concern   Not on file  Social History Narrative   Lives with wife and two kids   Drinks 1 cup of coffee a day    Social Drivers of Corporate investment banker Strain: Low Risk  (06/04/2023)   Overall Financial Resource Strain (CARDIA)    Difficulty of Paying Living Expenses: Not hard at all  Food Insecurity: No Food Insecurity (07/07/2023)   Hunger Vital Sign    Worried About Running Out of Food in the Last Year: Never true    Ran Out of Food in the Last Year: Never true  Transportation Needs: No Transportation Needs (07/07/2023)   PRAPARE - Administrator, Civil Service (Medical): No    Lack of Transportation (Non-Medical): No  Physical Activity: Sufficiently Active (06/04/2023)   Exercise Vital Sign    Days of Exercise per Week: 4 days    Minutes of Exercise per Session:  40 min  Stress: No Stress Concern Present (06/04/2023)   [employer] of Occupational Health - Occupational Stress Questionnaire    Feeling of Stress : Not at all  Social Connections: Socially Integrated (06/04/2023)   Social Connection and Isolation Panel [NHANES]    Frequency of Communication with Friends and Family: More than three times a week    Frequency of Social Gatherings with Friends and Family: Twice a week    Attends Religious Services: More than 4 times per year    Active Member of [employer] or Organizations:  Yes    Attends Club or Organization Meetings: More than 4 times per year    Marital Status: Married    His Allergies Are:  No Known Allergies:   His Current Medications Are:  Outpatient Encounter Medications as of 10/08/2023  Medication Sig   amLODipine (NORVASC) 2.5 MG tablet Take 1 tablet (2.5 mg total) by mouth daily.   aspirin EC 81 MG tablet Take 81 mg by mouth daily. Swallow whole.   atorvastatin (LIPITOR) 40 MG tablet Take 1 tablet (40 mg total) by mouth daily.   metFORMIN (GLUCOPHAGE) 500 MG tablet Take 1 tablet (500 mg total) by mouth 2 (two) times daily with a meal. 1-2 times per day as tolerated.   [EXPIRED] aspirin EC 81 MG tablet Take 1 tablet (81 mg total) by mouth daily. Swallow whole.   [DISCONTINUED] albuterol (VENTOLIN HFA) 108 (90 Base) MCG/ACT inhaler INHALE 1-2 PUFFS BY MOUTH EVERY 6 HOURS AS NEEDED FOR WHEEZE OR SHORTNESS OF BREATH   No facility-administered encounter medications on file as of 10/08/2023.  :   Review of Systems:  Out of a complete 14 point review of systems, all are reviewed and negative with the exception of these symptoms as listed below:  Review of Systems  Neurological:        Patient in room #9 with his wife. Patient states he has headaches normal in the back of his head and neck. Patient wife states he snores and he wakes up in the middle of the night. Patient states sometime its hard for him to go back to sleep. Patient  states sometime he doesn't feel rested in the morning. ESS-2 , FSS-19    Objective:  Neurological Exam  Physical Exam Physical Examination:   Vitals:   10/08/23 1258  BP: 124/80  Pulse: 68    General Examination: The patient is a very pleasant 48 y.o. male in no acute distress. He appears well-developed and well-nourished and well groomed.   HEENT: Normocephalic, atraumatic, pupils are equal, round and reactive to light and accommodation.  Corrective eyeglasses in place.  Extraocular tracking is good without limitation to gaze excursion or nystagmus noted. Normal smooth pursuit is noted. Hearing is grossly intact. Face is symmetric with normal facial animation. Speech is clear with no dysarthria noted. There is no hypophonia. There is no lip, neck/head, jaw or voice tremor. Neck is supple with full range of passive and active motion. There are no carotid bruits on auscultation. Oropharynx exam reveals: mild mouth dryness, good dental hygiene and moderate airway crowding, due to small airway entry and prominent uvula, wider tongue.  Tonsils 1+.  Neck circumference 17 3/8 inches. He has a minimal to mild overbite.   Chest: Clear to auscultation without wheezing, rhonchi or crackles noted.   Heart: S1+S2+0, regular and normal without murmurs, rubs or gallops noted.    Abdomen: Soft, non-tender and non-distended.   Extremities: There is no pitting edema in the distal lower extremities bilaterally.    Skin: Warm and dry without trophic changes noted.   Musculoskeletal: exam reveals no obvious joint deformities.    Neurologically:  Mental status: The patient is awake, alert and oriented in all 4 spheres. His immediate and remote memory, attention, language skills and fund of knowledge are appropriate. There is no evidence of aphasia, agnosia, apraxia or anomia. Speech is clear with normal prosody and enunciation. Thought process is linear. Mood is normal and affect is normal.  Cranial  nerves II - XII are as described  above under HEENT exam. In addition: shoulder shrug is normal with equal shoulder height noted. Motor exam: Normal bulk, moving all 4 extremities without restriction.  No obvious resting or action tremor.  Fine motor skills are grossly intact.   Sensory exam: intact to light touch in the upper and lower extremities.  Gait, station and balance: He stands easily. No veering to one side is noted. No leaning to one side is noted. Posture is age-appropriate and stance is narrow based. Gait shows normal stride length and normal pace. No problems turning are noted.   Assessment and Plan:  In summary, Brian Reid is a very pleasant 48 y.o.-year old male with an underlying medical history of stroke, hypertension, hyperlipidemia, asthma, and obesity, whose history and physical exam are concerning for sleep disordered breathing, particularly obstructive sleep apnea (OSA).  While a laboratory attended sleep study is typically considered "gold standard" for evaluation of sleep disordered breathing he would like to proceed with a home sleep test at this time.   I had a long chat with the patient and his wife about my findings and the diagnosis of sleep apnea, particularly OSA, its prognosis and treatment options. We talked about medical/conservative treatments, surgical interventions and non-pharmacological approaches for symptom control. I explained, in particular, the risks and ramifications of untreated moderate to severe OSA, especially with respect to developing cardiovascular disease down the road, including congestive heart failure (CHF), difficult to treat hypertension, cardiac arrhythmias (particularly A-fib), neurovascular complications including TIA, stroke and dementia. Even type 2 diabetes has, in part, been linked to untreated OSA. Symptoms of untreated OSA may include (but may not be limited to) daytime sleepiness, nocturia (i.e. frequent nighttime urination), memory  problems, mood irritability and suboptimally controlled or worsening mood disorder such as depression and/or anxiety, lack of energy, lack of motivation, physical discomfort, as well as recurrent headaches, especially morning or nocturnal headaches. We talked about the importance of maintaining a healthy lifestyle and striving for healthy weight. In addition, we talked about the importance of striving for and maintaining good sleep hygiene. I recommended a sleep study at this time. I outlined the differences between a laboratory attended sleep study which is considered more comprehensive and accurate over the option of a home sleep test (HST); the latter may lead to underestimation of sleep disordered breathing in some instances and does not help with diagnosing upper airway resistance syndrome and is not accurate enough to diagnose primary central sleep apnea typically. I outlined possible surgical and non-surgical treatment options of OSA, including the use of a positive airway pressure (PAP) device (i.e. CPAP, AutoPAP/APAP or BiPAP in certain circumstances), a custom-made dental device (aka oral appliance, which would require a referral to a specialist dentist or orthodontist typically, and is generally speaking not considered for patients with full dentures or edentulous state), upper airway surgical options, such as traditional UPPP (which is not considered a first-line treatment) or the Inspire device (hypoglossal nerve stimulator, which would involve a referral for consultation with an ENT surgeon, after careful selection, following inclusion criteria - also not first-line treatment). I explained the PAP treatment option to the patient in detail, as this is generally considered first-line treatment.  The patient indicated that he would be willing to try PAP therapy, if the need arises. I explained the importance of being compliant with PAP treatment, not only for insurance purposes but primarily to improve  patient's symptoms symptoms, and for the patient's long term health benefit, including to reduce his cardiovascular  risks longer-term.    We will pick up our discussion about the next steps and treatment options after testing.  We will keep them posted as to the test results by phone call and/or MyChart messaging where possible.  We will plan to follow-up in sleep clinic accordingly as well.  I answered all their questions today and the patient and his wife were in agreement.   I encouraged them to call with any interim questions, concerns, problems or updates or email [place] through MyChart.  Generally speaking, sleep test authorizations may take up to 2 weeks, sometimes less, sometimes longer, the patient is encouraged to get in touch with [place] if they do not hear back from the sleep lab staff directly within the next 2 weeks.  Thank you very much for allowing me to participate in the care of this nice patient. If I can be of any further assistance to you please do not hesitate to call me at [phone].  Sincerely,   Brian Foley, MD, PhD

## 2023-10-09 ENCOUNTER — Ambulatory Visit: Payer: Managed Care, Other (non HMO) | Attending: General Practice | Admitting: General Practice

## 2023-10-09 ENCOUNTER — Encounter: Payer: Self-pay | Admitting: General Practice

## 2023-10-09 VITALS — BP 120/80 | HR 56 | Ht 70.0 in | Wt 223.4 lb

## 2023-10-09 DIAGNOSIS — E785 Hyperlipidemia, unspecified: Secondary | ICD-10-CM

## 2023-10-09 DIAGNOSIS — I639 Cerebral infarction, unspecified: Secondary | ICD-10-CM | POA: Diagnosis not present

## 2023-10-09 DIAGNOSIS — Z720 Tobacco use: Secondary | ICD-10-CM | POA: Diagnosis not present

## 2023-10-09 DIAGNOSIS — I10 Essential (primary) hypertension: Secondary | ICD-10-CM | POA: Diagnosis not present

## 2023-10-09 NOTE — Patient Instructions (Signed)
 Medication Instructions:  The current medical regimen is effective;  continue present plan and medications as directed. Please refer to the Current Medication list given to you today.  *If you need a refill on your cardiac medications before your next appointment, please call your pharmacy* 3 Lab Work: NONE  Other Instructions TAKE AND LOG YOUR BLOOD PRESSURE CALL YOUR NEUROLOGIST AND GET SLEEP STUDY DONE(LET THEM KNOW THIS HAS NOT BEEN ORDERED)  Follow-Up: At [facility], you and your health needs are our priority.  As part of our continuing mission to provide you with exceptional heart care, we have created designated Provider Care Teams.  These Care Teams include your primary Cardiologist (physician) and Advanced Practice Providers (APPs -  Physician Assistants and Nurse Practitioners) who all work together to provide you with the care you need, when you need it.  Your next appointment:   6 month(s)  Provider:   Rollene Rotunda, MD

## 2023-10-23 ENCOUNTER — Ambulatory Visit: Payer: Managed Care, Other (non HMO) | Admitting: Family Medicine

## 2023-10-23 VITALS — BP 122/78 | HR 67 | Temp 97.9°F | Ht 70.0 in | Wt 223.2 lb

## 2023-10-23 DIAGNOSIS — Z8673 Personal history of transient ischemic attack (TIA), and cerebral infarction without residual deficits: Secondary | ICD-10-CM | POA: Diagnosis not present

## 2023-10-23 DIAGNOSIS — I10 Essential (primary) hypertension: Secondary | ICD-10-CM

## 2023-10-23 DIAGNOSIS — I639 Cerebral infarction, unspecified: Secondary | ICD-10-CM

## 2023-10-23 DIAGNOSIS — Z7984 Long term (current) use of oral hypoglycemic drugs: Secondary | ICD-10-CM

## 2023-10-23 DIAGNOSIS — E119 Type 2 diabetes mellitus without complications: Secondary | ICD-10-CM | POA: Diagnosis not present

## 2023-10-23 LAB — COMPREHENSIVE METABOLIC PANEL
ALT: 34 U/L (ref 0–53)
AST: 20 U/L (ref 0–37)
Albumin: 4.6 g/dL (ref 3.5–5.2)
Alkaline Phosphatase: 56 U/L (ref 39–117)
BUN: 11 mg/dL (ref 6–23)
CO2: 33 meq/L — ABNORMAL HIGH (ref 19–32)
Calcium: 9.6 mg/dL (ref 8.4–10.5)
Chloride: 101 meq/L (ref 96–112)
Creatinine, Ser: 0.95 mg/dL (ref 0.40–1.50)
GFR: 95.07 mL/min (ref >60.00)
Glucose, Bld: 139 mg/dL — ABNORMAL HIGH (ref 70–99)
Potassium: 3.9 meq/L (ref 3.5–5.1)
Sodium: 140 meq/L (ref 135–145)
Total Bilirubin: 0.8 mg/dL (ref 0.2–1.2)
Total Protein: 6.8 g/dL (ref 6.0–8.3)

## 2023-10-23 LAB — HEMOGLOBIN A1C: Hgb A1c MFr Bld: 7.3 % — ABNORMAL HIGH (ref 4.6–6.5)

## 2023-10-23 LAB — LIPID PANEL
Cholesterol: 120 mg/dL (ref 0–200)
HDL: 34.6 mg/dL — ABNORMAL LOW (ref >39.00)
LDL Cholesterol: 52 mg/dL (ref 0–99)
NonHDL: 84.94
Total CHOL/HDL Ratio: 3
Triglycerides: 164 mg/dL — ABNORMAL HIGH (ref 0.0–149.0)
VLDL: 32.8 mg/dL (ref 0.0–40.0)

## 2023-10-23 MED ORDER — ATORVASTATIN CALCIUM 40 MG PO TABS: 40.0000 mg | ORAL_TABLET | Freq: Every day | ORAL | 1 refills | Status: DC

## 2023-10-23 MED ORDER — METFORMIN HCL 500 MG PO TABS
500.0000 mg | ORAL_TABLET | Freq: Two times a day (BID) | ORAL | 1 refills | Status: DC
Start: 2023-10-23 — End: 2024-02-03

## 2023-10-23 MED ORDER — AMLODIPINE BESYLATE 2.5 MG PO TABS: 2.5000 mg | ORAL_TABLET | Freq: Every day | ORAL | 3 refills | Status: DC

## 2023-10-23 NOTE — Patient Instructions (Signed)
 Thanks for coming in today.  No medication changes at this time.  If any concerns on labs I will let you know.  As long as labs are stable I will see you in 6 months for a physical and repeat labs at that time.  Take care.

## 2023-10-23 NOTE — Progress Notes (Signed)
 Subjective:  Patient ID: Brian Reid, male    DOB: 1975-11-10  Age: 48 y.o. MRN: [id number]  CC:  Chief Complaint  Patient presents with   Medical Management of Chronic Issues    Pt is doing well, notes will wake most mornings with soreness in his Lt side/shoulder and neck but doesn't really know what caused it or what to do     HPI Brian Reid presents for   History of CVA Admitted December 2 through July 08, 2023.  MRI with focus of acute early subacute ischemia at the junction of the left precentral and postcentral gyri.  Prior right sided weakness, no residual weakness.  Improved after 4 to 5 days.  Hospital follow-up with me in December.  He had been started on dual antiplatelet therapy with aspirin and Plavix followed by aspirin alone after 3 weeks.  Atorvastatin 40 mg daily for lipid management, and additional 2.5 mg amlodipine provided by cardiology in December. No new HA, weakness. Notes occasional brief numbness right side - transient. No weakness. No bleeding with daily ASA.   Diabetes: Metformin 500 mg twice daily.  Improved control in December.  Goal under 7 with history of CVA as above. No side effects with metformin.  No home readings, no sx;s of hypoglycemia.  Microalbumin: Normal 07/20/2023 Optho, foot exam, pneumovax:  Pneumococcal vaccine, flu vaccine, COVID vaccine, tetanus vaccine declined.  Colonoscopy/colon cancer screening - negative cologuard 03/2021.   Lab Results  Component Value Date   HGBA1C 7.3 (H) 10/23/2023   HGBA1C 6.8 (H) 07/20/2023   HGBA1C 7.6 (H) 03/20/2023   Lab Results  Component Value Date   MICROALBUR <0.7 07/20/2023   LDLCALC 52 10/23/2023   CREATININE 0.95 10/23/2023   Hyperlipidemia: Started on atorvastatin 40 mg daily with hospitalization in December.  Due for repeat labs. Taking 40mg  dose daily no myalgia. Only occasional neck pain - as in past. Not persistent. (Rtc precautions discussed if persistent/worsening)  Lab Results   Component Value Date   CHOL 120 10/23/2023   HDL 34.60 (L) 10/23/2023   LDLCALC 52 10/23/2023   LDLDIRECT 119.0 03/20/2023   TRIG 164.0 (H) 10/23/2023   CHOLHDL 3 10/23/2023   Lab Results  Component Value Date   ALT 34 10/23/2023   AST 20 10/23/2023   ALKPHOS 56 10/23/2023   BILITOT 0.8 10/23/2023   Hypertension: As above, 2.5 mg amlodipine provided by cardiology in December. Still taking once per day. No edema. Cardiology visit 3/7.  Met with sleep specialist testing planned.  Home readings: 130/80.  BP Readings from Last 3 Encounters:  10/23/23 122/78  10/09/23 120/80  10/08/23 124/80   Lab Results  Component Value Date   CREATININE 0.95 10/23/2023        History Patient Active Problem List   Diagnosis Date Noted   Stroke (HCC) 08/25/2023   HTN (hypertension) 08/25/2023   HLD (hyperlipidemia) 08/25/2023   Snoring 08/25/2023   TIA (transient ischemic attack) 07/06/2023   Elevated BP without diagnosis of hypertension 07/06/2023   Pain in left hand 09/09/2021   Left forearm pain 04/12/2021   Acute appendicitis s/p lap appendectomy 04/02/2016 04/03/2016   Past Medical History:  Diagnosis Date   Asthma    HLD (hyperlipidemia) 08/25/2023   HTN (hypertension) 08/25/2023   Past Surgical History:  Procedure Laterality Date   APPENDECTOMY N/A    [facility] 06/27/2020   LAPAROSCOPIC APPENDECTOMY N/A 04/02/2016   Procedure: APPENDECTOMY LAPAROSCOPIC;  Surgeon: Chevis Pretty III, MD;  Location: [facility];  Service: General;  Laterality: N/A;   LEG SURGERY     age 75   OPEN REDUCTION SHOULDER DISLOCATION     age 28   TRANSESOPHAGEAL ECHOCARDIOGRAM (CATH LAB) N/A 07/16/2023   Procedure: TRANSESOPHAGEAL ECHOCARDIOGRAM;  Surgeon: Thomasene Ripple, DO;  Location: [facility];  Service: Cardiovascular;  Laterality: N/A;   No Known Allergies Prior to Admission medications   Medication Sig Start Date End Date Taking? Authorizing Provider  aspirin EC 81 MG tablet Take 81 mg by  mouth daily. Swallow whole.   Yes [provider]  atorvastatin (LIPITOR) 40 MG tablet Take 1 tablet (40 mg total) by mouth daily. 08/06/23 11/04/23 Yes Rollene Rotunda, MD  metFORMIN (GLUCOPHAGE) 500 MG tablet Take 1 tablet (500 mg total) by mouth 2 (two) times daily with a meal. 1-2 times per day as tolerated. 05/18/23  Yes [name], MD  amLODipine (NORVASC) 2.5 MG tablet Take 1 tablet (2.5 mg total) by mouth daily. 07/10/23 10/08/23  Rollene Rotunda, MD   Social History   Socioeconomic History   Marital status: Married    Spouse name: Pattricia Boss   Number of children: 2   Years of education: post grad   Highest education level: Master's degree (e.g., MA, MS, MEng, MEd, MSW, MBA)  Occupational History    Comment: IT professional  Tobacco Use   Smoking status: Former   Smokeless tobacco: Never   Tobacco comments:    03/05/16 "very occasional now", 06/06/16 not smoking  Vaping Use   Vaping status: Never Used  Substance and Sexual Activity   Alcohol use: Not Currently    Alcohol/week: 2.0 standard drinks of alcohol    Types: 2 Standard drinks or equivalent per week    Comment: social   Drug use: Never   Sexual activity: Yes    Partners: Female    Comment: married  Other Topics Concern   Not on file  Social History Narrative   Lives with wife and two kids   Drinks 1 cup of coffee a day    Social Drivers of Corporate investment banker Strain: Low Risk  (10/23/2023)   Overall Financial Resource Strain (CARDIA)    Difficulty of Paying Living Expenses: Not hard at all  Food Insecurity: No Food Insecurity (10/23/2023)   Hunger Vital Sign    Worried About Running Out of Food in the Last Year: Never true    Ran Out of Food in the Last Year: Never true  Transportation Needs: No Transportation Needs (10/23/2023)   PRAPARE - Administrator, Civil Service (Medical): No    Lack of Transportation (Non-Medical): No  Physical Activity: Insufficiently Active (10/23/2023)    Exercise Vital Sign    Days of Exercise per Week: 3 days    Minutes of Exercise per Session: 30 min  Stress: No Stress Concern Present (10/23/2023)   [employer] of Occupational Health - Occupational Stress Questionnaire    Feeling of Stress : Not at all  Social Connections: Socially Integrated (10/23/2023)   Social Connection and Isolation Panel [NHANES]    Frequency of Communication with Friends and Family: More than three times a week    Frequency of Social Gatherings with Friends and Family: More than three times a week    Attends Religious Services: More than 4 times per year    Active Member of [employer] or Organizations: No    Attends Engineer, structural: More than 4 times per year  Marital Status: Married  Catering manager Violence: Not At Risk (07/08/2023)   Humiliation, Afraid, Rape, and Kick questionnaire    Fear of Current or Ex-Partner: No    Emotionally Abused: No    Physically Abused: No    Sexually Abused: No    Review of Systems  Constitutional:  Negative for fatigue and unexpected weight change.  Eyes:  Negative for visual disturbance.  Respiratory:  Negative for cough, chest tightness and shortness of breath.   Cardiovascular:  Negative for chest pain, palpitations and leg swelling.  Gastrointestinal:  Negative for abdominal pain and blood in stool.  Neurological:  Negative for dizziness, light-headedness and headaches.     Objective:   Vitals:   10/23/23 0841  BP: 122/78  Pulse: 67  Temp: 97.9 F (36.6 C)  TempSrc: Temporal  SpO2: 98%  Weight: 223 lb 3.2 oz (101.2 kg)  Height: 5\' 10"  (1.778 m)     Physical Exam Vitals reviewed.  Constitutional:      Appearance: He is well-developed.  HENT:     Head: Normocephalic and atraumatic.  Neck:     Vascular: No carotid bruit or JVD.  Cardiovascular:     Rate and Rhythm: Normal rate and regular rhythm.     Heart sounds: Normal heart sounds. No murmur heard. Pulmonary:     Effort:  Pulmonary effort is normal.     Breath sounds: Normal breath sounds. No rales.  Musculoskeletal:     Right lower leg: No edema.     Left lower leg: No edema.  Skin:    General: Skin is warm and dry.  Neurological:     Mental Status: He is alert and oriented to person, place, and time.  Psychiatric:        Mood and Affect: Mood normal.        Assessment & Plan:  Brian Reid is a 48 y.o. male . Diabetes mellitus without complication (HCC) - Plan: atorvastatin (LIPITOR) 40 MG tablet, metFORMIN (GLUCOPHAGE) 500 MG tablet, Comprehensive metabolic panel, Lipid panel, Hemoglobin A1c  -Tolerating current med regimen, borderline A1c control, will review options with patient.  No changes for now.  Goal LDL under 7 with history of CVA.  Essential hypertension - Plan: amLODipine (NORVASC) 2.5 MG tablet, Comprehensive metabolic panel  -Tolerating amlodipine with borderline BP control, no changes for now, goal of 130/80 or below.  Cerebrovascular accident (CVA), unspecified mechanism (HCC) - Plan: atorvastatin (LIPITOR) 40 MG tablet  -Tolerating Lipitor with significantly improved LDL.  No changes for now.  Meds ordered this encounter  Medications   amLODipine (NORVASC) 2.5 MG tablet    Sig: Take 1 tablet (2.5 mg total) by mouth daily.    Dispense:  180 tablet    Refill:  3   atorvastatin (LIPITOR) 40 MG tablet    Sig: Take 1 tablet (40 mg total) by mouth daily.    Dispense:  90 tablet    Refill:  1   metFORMIN (GLUCOPHAGE) 500 MG tablet    Sig: Take 1 tablet (500 mg total) by mouth 2 (two) times daily with a meal. 1-2 times per day as tolerated.    Dispense:  180 tablet    Refill:  1   Patient Instructions  Thanks for coming in today.  No medication changes at this time.  If any concerns on labs I will let you know.  As long as labs are stable I will see you in 6 months for a physical and repeat labs  at that time.  Take care.     Signed,   Meredith Staggers, MD [facility], [facility] 10/23/23 1:59 PM

## 2023-10-27 ENCOUNTER — Encounter: Payer: Self-pay | Admitting: Family Medicine

## 2023-11-13 DIAGNOSIS — M79671 Pain in right foot: Secondary | ICD-10-CM | POA: Insufficient documentation

## 2023-12-07 DIAGNOSIS — M7662 Achilles tendinitis, left leg: Secondary | ICD-10-CM | POA: Insufficient documentation

## 2024-02-01 ENCOUNTER — Encounter: Payer: Self-pay | Admitting: Neurology

## 2024-02-03 ENCOUNTER — Ambulatory Visit: Admitting: Family Medicine

## 2024-02-03 VITALS — BP 128/72 | HR 78 | Temp 98.1°F | Resp 16 | Ht 70.0 in | Wt 220.0 lb

## 2024-02-03 DIAGNOSIS — E1149 Type 2 diabetes mellitus with other diabetic neurological complication: Secondary | ICD-10-CM

## 2024-02-03 DIAGNOSIS — Z23 Encounter for immunization: Secondary | ICD-10-CM

## 2024-02-03 DIAGNOSIS — R52 Pain, unspecified: Secondary | ICD-10-CM

## 2024-02-03 DIAGNOSIS — Z7984 Long term (current) use of oral hypoglycemic drugs: Secondary | ICD-10-CM

## 2024-02-03 LAB — COMPREHENSIVE METABOLIC PANEL WITH GFR
ALT: 30 U/L (ref 0–53)
AST: 15 U/L (ref 0–37)
Albumin: 4.6 g/dL (ref 3.5–5.2)
Alkaline Phosphatase: 54 U/L (ref 39–117)
BUN: 8 mg/dL (ref 6–23)
CO2: 32 meq/L (ref 19–32)
Calcium: 9 mg/dL (ref 8.4–10.5)
Chloride: 102 meq/L (ref 96–112)
Creatinine, Ser: 0.95 mg/dL (ref 0.40–1.50)
GFR: 94.88 mL/min (ref >60.00)
Glucose, Bld: 190 mg/dL — ABNORMAL HIGH (ref 70–99)
Potassium: 4.2 meq/L (ref 3.5–5.1)
Sodium: 140 meq/L (ref 135–145)
Total Bilirubin: 0.7 mg/dL (ref 0.2–1.2)
Total Protein: 6.6 g/dL (ref 6.0–8.3)

## 2024-02-03 LAB — CK: Total CK: 145 U/L (ref 7–232)

## 2024-02-03 LAB — MICROALBUMIN / CREATININE URINE RATIO
Creatinine,U: 227.8 mg/dL
Microalb Creat Ratio: 3.9 mg/g (ref 0.0–30.0)
Microalb, Ur: 0.9 mg/dL (ref 0.0–1.9)

## 2024-02-03 LAB — HEMOGLOBIN A1C: Hgb A1c MFr Bld: 9.5 % — ABNORMAL HIGH (ref 4.6–6.5)

## 2024-02-03 MED ORDER — METFORMIN HCL ER 500 MG PO TB24: 1000.0000 mg | ORAL_TABLET | Freq: Every day | ORAL | 2 refills | Status: DC

## 2024-02-03 NOTE — Patient Instructions (Addendum)
 Make sure to stay well-hydrated with the elevated temperatures recently and sometimes that can cause some cramps or aches.  I will check a muscle enzyme test with your blood work, no med changes for now but if you continue to have body aches, let me know.  You can also try an over-the-counter supplement called co-Q10 to see if that helps with bodyaches.  I will check your diabetes level today, and can discuss if further changes needed but I did go ahead and change you to the extended release metformin  which may have less bloating.  Take 2 pills once per day.  Depending on the hepatitis B lab test today, we can schedule a nurse visit for a hepatitis B vaccine if you are not immune.  Pneumonia vaccine given today with planned repeat in approximately 5 years.  Take care!

## 2024-02-03 NOTE — Progress Notes (Signed)
 Subjective:  Patient ID: Brian Reid, male    DOB: May 01, 1976  Age: 48 y.o. MRN: [id number]  CC:  Chief Complaint  Patient presents with   Medical Management of Chronic Issues    Pt notes doing okay no concerns at this time just follow up     HPI Brian Reid presents for   Diabetes: With history of CVA.  December 2024.  Dual antiplatelet therapy with aspirin  and Plavix  followed by aspirin  alone after 3 weeks. Treated with metformin  500 mg twice daily.  Slight elevated A1c in March, plan diet/exercise changes with repeat testing today.Brian Reid is on statin with Lipitor 40 mg daily No new side effects, slight bloating, no diarrhea.  No home readings.  No chest pains, some bodyaches/cramps with workouts, or with working outside. Variable areas. Taking statin. Drinking fluids.  Microalbumin: Due. Optho, foot exam, pneumovax:  Pneumonia and hepatitis B vaccines - plans today with repeat Hep B next visit.  Immunization History  Administered Date(s) Administered   PFIZER Comirnaty(Gray Top)Covid-19 Tri-Sucrose Vaccine 11/15/2019, 12/06/2019, 07/31/2020   PFIZER(Purple Top)SARS-COV-2 Vaccination 07/31/2020   Tdap 06/04/2023   ` Lab Results  Component Value Date   HGBA1C 7.3 (H) 10/23/2023   HGBA1C 6.8 (H) 07/20/2023   HGBA1C 7.6 (H) 03/20/2023   Lab Results  Component Value Date   LDLCALC 52 10/23/2023   CREATININE 0.95 10/23/2023    History Patient Active Problem List   Diagnosis Date Noted   Left Achilles tendinitis 12/07/2023   Pain of right heel 11/13/2023   HTN (hypertension) 08/25/2023   HLD (hyperlipidemia) 08/25/2023   Snoring 08/25/2023   TIA (transient ischemic attack) 07/06/2023   Elevated BP without diagnosis of hypertension 07/06/2023   Pain in left hand 09/09/2021   Left forearm pain 04/12/2021   Acute appendicitis s/p lap appendectomy 04/02/2016 04/03/2016   Past Medical History:  Diagnosis Date   Asthma    HLD (hyperlipidemia) 08/25/2023   HTN  (hypertension) 08/25/2023   Past Surgical History:  Procedure Laterality Date   APPENDECTOMY N/A    [facility]reesia 06/27/2020   LAPAROSCOPIC APPENDECTOMY N/A 04/02/2016   Procedure: APPENDECTOMY LAPAROSCOPIC;  Surgeon: Deward Null III, MD;  Location: [facility];  Service: General;  Laterality: N/A;   LEG SURGERY     age 59   OPEN REDUCTION SHOULDER DISLOCATION     age 18   TRANSESOPHAGEAL ECHOCARDIOGRAM (CATH LAB) N/A 07/16/2023   Procedure: TRANSESOPHAGEAL ECHOCARDIOGRAM;  Surgeon: Sheena Pugh, DO;  Location: [facility];  Service: Cardiovascular;  Laterality: N/A;   No Known Allergies Prior to Admission medications   Medication Sig Start Date End Date Taking? Authorizing Provider  albuterol  (VENTOLIN  HFA) 108 (90 Base) MCG/ACT inhaler INHALE 1-2 PUFFS BY MOUTH EVERY 6 HOURS AS NEEDED FOR WHEEZE OR SHORTNESS OF BREATH   Yes [provider]  amLODipine  (NORVASC ) 2.5 MG tablet Take 1 tablet (2.5 mg total) by mouth daily. 10/23/23 02/03/24 Yes Levora Reyes SAUNDERS, MD  aspirin  EC 81 MG tablet Take 81 mg by mouth daily. Swallow whole.   Yes [provider]  atorvastatin  (LIPITOR) 40 MG tablet Take 1 tablet (40 mg total) by mouth daily. 10/23/23 02/03/24 Yes Levora Reyes SAUNDERS, MD  azithromycin  (ZITHROMAX ) 250 MG tablet TAKE 2 TABLETS ON DAY 1, THEN 1 TABLET DAILY ON DAYS 2 THROUGH 5   Yes [provider]  clopidogrel  (PLAVIX ) 75 MG tablet TAKE 1 TABLET (75 MG TOTAL) BY MOUTH DAILY FOR 21 DAYS.   Yes [provider]  cyclobenzaprine  (FLEXERIL ) 5 MG tablet TAKE 0.5-1 TABLETS (2.5-5 MG TOTAL) BY MOUTH AT BEDTIME AS NEEDED FOR MUSCLE SPASMS.   Yes [provider]  meloxicam  (MOBIC ) 15 MG tablet TAKE 1 TABLET EVERY DAY BY ORAL ROUTE WITH MEAL(S) FOR 30 DAYS.   Yes [provider]  metFORMIN  (GLUCOPHAGE ) 500 MG tablet Take 1 tablet (500 mg total) by mouth 2 (two) times daily with a meal. 1-2 times per day as tolerated. 10/23/23  Yes Levora Reyes SAUNDERS, MD    Social History   Socioeconomic History   Marital status: Married    Spouse name: [name]   Number of children: 2   Years of education: post grad   Highest education level: Master's degree (e.g., MA, MS, MEng, MEd, MSW, MBA)  Occupational History    Comment: IT professional  Tobacco Use   Smoking status: Former   Smokeless tobacco: Never   Tobacco comments:    03/05/16 very occasional now, 06/06/16 not smoking  Vaping Use   Vaping status: Never Used  Substance and Sexual Activity   Alcohol use: Not Currently    Alcohol/week: 2.0 standard drinks of alcohol    Types: 2 Standard drinks or equivalent per week    Comment: social   Drug use: Never   Sexual activity: Yes    Partners: Female    Comment: married  Other Topics Concern   Not on file  Social History Narrative   Lives with wife and two kids   Drinks 1 cup of coffee a day    Social Drivers of Corporate investment banker Strain: Low Risk  (02/03/2024)   Overall Financial Resource Strain (CARDIA)    Difficulty of Paying Living Expenses: Not hard at all  Food Insecurity: No Food Insecurity (02/03/2024)   Hunger Vital Sign    Worried About Running Out of Food in the Last Year: Never true    Ran Out of Food in the Last Year: Never true  Transportation Needs: No Transportation Needs (02/03/2024)   PRAPARE - Administrator, Civil Service (Medical): No    Lack of Transportation (Non-Medical): No  Physical Activity: Sufficiently Active (02/03/2024)   Exercise Vital Sign    Days of Exercise per Week: 3 days    Minutes of Exercise per Session: 50 min  Stress: No Stress Concern Present (02/03/2024)   [employer] of Occupational Health - Occupational Stress Questionnaire    Feeling of Stress: Only a little  Social Connections: Moderately Integrated (02/03/2024)   Social Connection and Isolation Panel    Frequency of Communication with Friends and Family: Three times a week    Frequency of Social Gatherings with  Friends and Family: Three times a week    Attends Religious Services: More than 4 times per year    Active Member of Clubs or Organizations: No    Attends Banker Meetings: Not on file    Marital Status: Married  Intimate Partner Violence: Not At Risk (07/08/2023)   Humiliation, Afraid, Rape, and Kick questionnaire    Fear of Current or Ex-Partner: No    Emotionally Abused: No    Physically Abused: No    Sexually Abused: No    Review of Systems  Constitutional:  Negative for fatigue and unexpected weight change.  Eyes:  Negative for visual disturbance.  Respiratory:  Negative for cough, chest tightness and shortness of breath.   Cardiovascular:  Negative for chest pain, palpitations and leg swelling.  Gastrointestinal:  Negative for abdominal pain and blood in stool.  Neurological:  Negative for dizziness, light-headedness and headaches.     Objective:   Vitals:   02/03/24 0839  BP: 128/72  Pulse: 78  Resp: 16  Temp: 98.1 F (36.7 C)  TempSrc: Temporal  SpO2: 97%  Weight: 220 lb (99.8 kg)  Height: 5' 10 (1.778 m)     Physical Exam Vitals reviewed.  Constitutional:      Appearance: Reid is well-developed.  HENT:     Head: Normocephalic and atraumatic.  Neck:     Vascular: No carotid bruit or JVD.  Cardiovascular:     Rate and Rhythm: Normal rate and regular rhythm.     Heart sounds: Normal heart sounds. No murmur heard. Pulmonary:     Effort: Pulmonary effort is normal.     Breath sounds: Normal breath sounds. No rales.  Musculoskeletal:     Right lower leg: No edema.     Left lower leg: No edema.  Skin:    General: Skin is warm and dry.  Neurological:     Mental Status: Reid is alert and oriented to person, place, and time.  Psychiatric:        Mood and Affect: Mood normal.        Assessment & Plan:  Brian Reid is a 49 y.o. male . Type 2 diabetes mellitus with other neurologic complication, without long-term current use of insulin  (HCC)  - Plan: Microalbumin / creatinine urine ratio, metFORMIN  (GLUCOPHAGE -XR) 500 MG 24 hr tablet, Comprehensive metabolic panel with GFR, Hemoglobin A1c  - Check labs, changed to extended release metformin  for possible bloating.  Adjust dose based on A1c result, needs to be under 7, ideally closer to 6.5 with history of CVA.  Body aches - Plan: CK  - May be related to heat, exercise.  Check electrolytes above, check CPK, option of co-Q10 supplement with use of statin with RTC precautions if persistent symptoms.  Need for pneumococcal vaccination - Plan: Pneumococcal conjugate vaccine 20-valent (Prevnar 20)  Need for hepatitis B vaccination - Plan:   - Check titer for hepatitis B surface antibody, then if negative can schedule nurse visit for hep B vaccine. Meds ordered this encounter  Medications   metFORMIN  (GLUCOPHAGE -XR) 500 MG 24 hr tablet    Sig: Take 2 tablets (1,000 mg total) by mouth daily with breakfast.    Dispense:  180 tablet    Refill:  2   Patient Instructions  Make sure to stay well-hydrated with the elevated temperatures recently and sometimes that can cause some cramps or aches.  I will check a muscle enzyme test with your blood work, no med changes for now but if you continue to have body aches, let me know.  You can also try an over-the-counter supplement called co-Q10 to see if that helps with bodyaches.  I will check your diabetes level today, and can discuss if further changes needed but I did go ahead and change you to the extended release metformin  which may have less bloating.  Take 2 pills once per day.  First of 2 hepatitis B vaccines given today, second 1 can be at your next visit.  Pneumonia vaccine also given today with planned repeat in approximately 5 years.  Take care!    Signed,   Reyes Pines, MD [facility], [facility] 02/03/24 9:23 AM

## 2024-02-04 LAB — HEPATITIS B SURFACE ANTIBODY, QUANTITATIVE: Hep B S AB Quant (Post): <5 m[IU]/mL — ABNORMAL LOW (ref >=10)

## 2024-02-07 ENCOUNTER — Ambulatory Visit: Payer: Self-pay | Admitting: Family Medicine

## 2024-02-11 ENCOUNTER — Encounter: Payer: Self-pay | Admitting: Family Medicine

## 2024-02-11 ENCOUNTER — Ambulatory Visit: Admitting: Family Medicine

## 2024-02-11 VITALS — BP 122/68 | HR 76 | Temp 98.1°F | Resp 18 | Ht 70.0 in | Wt 222.2 lb

## 2024-02-11 DIAGNOSIS — E1149 Type 2 diabetes mellitus with other diabetic neurological complication: Secondary | ICD-10-CM

## 2024-02-11 DIAGNOSIS — Z7984 Long term (current) use of oral hypoglycemic drugs: Secondary | ICD-10-CM

## 2024-02-11 DIAGNOSIS — E1165 Type 2 diabetes mellitus with hyperglycemia: Secondary | ICD-10-CM | POA: Diagnosis not present

## 2024-02-11 LAB — GLUCOSE, POCT (MANUAL RESULT ENTRY): POC Glucose: 197 mg/dL — AB (ref 70–99)

## 2024-02-11 MED ORDER — FREESTYLE LIBRE 3 SENSOR MISC: 1.0000 | 1 refills | Status: DC

## 2024-02-11 MED ORDER — EMPAGLIFLOZIN 10 MG PO TABS: 10.0000 mg | ORAL_TABLET | Freq: Every day | ORAL | 1 refills | Status: DC

## 2024-02-11 NOTE — Progress Notes (Signed)
 Subjective:  Patient ID: Brian Reid, male    DOB: [birth year]  Age: 48 y.o. MRN: [id number]  CC:  Chief Complaint  Patient presents with   Diabetes    Pt notes BG has been better, patient did not pick up Rx that was sent in the other day as he was concerned additional changes may need to be made     HPI Brian Reid presents for   Diabetes: Follow-up from July 2.  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  Had been treated with metformin  500 mg twice daily, slight bloating but no diarrhea at his last visit.  I did initially change him to the extended release metformin  to lessen risk for bloating, gastrointestinal side effects, but he has not yet filled that medication due to elevated A1c and possible other changes.  A1c had increased to 9.5 on most recent labs, up from 7.3 in March.  Has been exercising, but increased sweets in June. Did ok on weekdays, less adherence on weekends.  No home readings - has meter at home, just not using. Would like CGM if covered.  Feels like doing ok with weight loss now.  Can do better with eating  - less eating out. No n/v/blurry vision - only slight in am only.  GLP1/GIP, SGLT2 options discussed, including risks/benefits.   Traveling in August - few weeks per month - needs advice for diet.   Lab Results  Component Value Date   HGBA1C 9.5 (H) 02/03/2024       History Patient Active Problem List   Diagnosis Date Noted   Left Achilles tendinitis 12/07/2023   Pain of right heel 11/13/2023   HTN (hypertension) 08/25/2023   HLD (hyperlipidemia) 08/25/2023   Snoring 08/25/2023   TIA (transient ischemic attack) 07/06/2023   Elevated BP without diagnosis of hypertension 07/06/2023   Pain in left hand 09/09/2021   Left forearm pain 04/12/2021   Acute appendicitis s/p lap appendectomy 04/02/2016 04/03/2016   Past Medical History:  Diagnosis Date   Asthma    HLD (hyperlipidemia) 08/25/2023   HTN (hypertension) 08/25/2023   Past Surgical  History:  Procedure Laterality Date   APPENDECTOMY N/A    [facility] 06/27/2020   LAPAROSCOPIC APPENDECTOMY N/A 04/02/2016   Procedure: APPENDECTOMY LAPAROSCOPIC;  Surgeon: Deward Null III, MD;  Location: [facility];  Service: General;  Laterality: N/A;   LEG SURGERY     age 26   OPEN REDUCTION SHOULDER DISLOCATION     age 32   TRANSESOPHAGEAL ECHOCARDIOGRAM (CATH LAB) N/A 07/16/2023   Procedure: TRANSESOPHAGEAL ECHOCARDIOGRAM;  Surgeon: Sheena Pugh, DO;  Location: [facility];  Service: Cardiovascular;  Laterality: N/A;   No Known Allergies Prior to Admission medications   Medication Sig Start Date End Date Taking? Authorizing Provider  albuterol  (VENTOLIN  HFA) 108 (90 Base) MCG/ACT inhaler INHALE 1-2 PUFFS BY MOUTH EVERY 6 HOURS AS NEEDED FOR WHEEZE OR SHORTNESS OF BREATH   Yes [provider]  amLODipine  (NORVASC ) 2.5 MG tablet Take 1 tablet (2.5 mg total) by mouth daily. 10/23/23 02/11/24 Yes Levora Reyes SAUNDERS, MD  aspirin  EC 81 MG tablet Take 81 mg by mouth daily. Swallow whole.   Yes [provider]  atorvastatin  (LIPITOR) 40 MG tablet Take 1 tablet (40 mg total) by mouth daily. 10/23/23 02/11/24 Yes Levora Reyes SAUNDERS, MD  azithromycin  (ZITHROMAX ) 250 MG tablet TAKE 2 TABLETS ON DAY 1, THEN 1 TABLET DAILY ON DAYS 2 THROUGH 5   Yes [provider]  clopidogrel  (PLAVIX ) 75 MG tablet TAKE 1 TABLET (75 MG TOTAL) BY MOUTH DAILY FOR 21 DAYS.   Yes [provider]  cyclobenzaprine  (FLEXERIL ) 5 MG tablet TAKE 0.5-1 TABLETS (2.5-5 MG TOTAL) BY MOUTH AT BEDTIME AS NEEDED FOR MUSCLE SPASMS.   Yes [provider]  meloxicam  (MOBIC ) 15 MG tablet TAKE 1 TABLET EVERY DAY BY ORAL ROUTE WITH MEAL(S) FOR 30 DAYS.   Yes [provider]  metFORMIN  (GLUCOPHAGE -XR) 500 MG 24 hr tablet Take 2 tablets (1,000 mg total) by mouth daily with breakfast. 02/03/24  Yes Levora Reyes SAUNDERS, MD   Social History   Socioeconomic History   Marital status: Married     Spouse name: [name]   Number of children: 2   Years of education: post grad   Highest education level: Master's degree (e.g., MA, MS, MEng, MEd, MSW, MBA)  Occupational History    Comment: IT professional  Tobacco Use   Smoking status: Former   Smokeless tobacco: Never   Tobacco comments:    03/05/16 very occasional now, 06/06/16 not smoking  Vaping Use   Vaping status: Never Used  Substance and Sexual Activity   Alcohol use: Not Currently    Alcohol/week: 2.0 standard drinks of alcohol    Types: 2 Standard drinks or equivalent per week    Comment: social   Drug use: Never   Sexual activity: Yes    Partners: Female    Comment: married  Other Topics Concern   Not on file  Social History Narrative   Lives with wife and two kids   Drinks 1 cup of coffee a day    Social Drivers of Corporate investment banker Strain: Low Risk  (02/03/2024)   Overall Financial Resource Strain (CARDIA)    Difficulty of Paying Living Expenses: Not hard at all  Food Insecurity: No Food Insecurity (02/03/2024)   Hunger Vital Sign    Worried About Running Out of Food in the Last Year: Never true    Ran Out of Food in the Last Year: Never true  Transportation Needs: No Transportation Needs (02/03/2024)   PRAPARE - Administrator, Civil Service (Medical): No    Lack of Transportation (Non-Medical): No  Physical Activity: Sufficiently Active (02/03/2024)   Exercise Vital Sign    Days of Exercise per Week: 3 days    Minutes of Exercise per Session: 50 min  Stress: No Stress Concern Present (02/03/2024)   [employer] of Occupational Health - Occupational Stress Questionnaire    Feeling of Stress: Only a little  Social Connections: Moderately Integrated (02/03/2024)   Social Connection and Isolation Panel    Frequency of Communication with Friends and Family: Three times a week    Frequency of Social Gatherings with Friends and Family: Three times a week    Attends Religious Services: More than  4 times per year    Active Member of Clubs or Organizations: No    Attends Banker Meetings: Not on file    Marital Status: Married  Intimate Partner Violence: Not At Risk (07/08/2023)   Humiliation, Afraid, Rape, and Kick questionnaire    Fear of Current or Ex-Partner: No    Emotionally Abused: No    Physically Abused: No    Sexually Abused: No    Review of Systems   Objective:   Vitals:   02/11/24 1555  BP: 122/68  Pulse: 76  Resp: 18  Temp: 98.1 F (36.7 C)  TempSrc: Temporal  SpO2: 98%  Weight: 222 lb 3.2 oz (100.8 kg)  Height: 5' 10 (1.778 m)    Physical Exam Vitals reviewed.  Constitutional:      Appearance: He is well-developed.  HENT:     Head: Normocephalic and atraumatic.  Neck:     Vascular: No carotid bruit or JVD.  Cardiovascular:     Rate and Rhythm: Normal rate and regular rhythm.     Heart sounds: Normal heart sounds. No murmur heard. Pulmonary:     Effort: Pulmonary effort is normal.     Breath sounds: Normal breath sounds. No rales.  Musculoskeletal:     Right lower leg: No edema.     Left lower leg: No edema.  Skin:    General: Skin is warm and dry.  Neurological:     Mental Status: He is alert and oriented to person, place, and time.  Psychiatric:        Mood and Affect: Mood normal.     Results for orders placed or performed in visit on 02/11/24  POCT glucose (manual entry)   Collection Time: 02/11/24  4:00 PM  Result Value Ref Range   POC Glucose 197 (A) 70 - 99 mg/dl    Assessment & Plan:  Birdie Beveridge is a 48 y.o. male . Type 2 diabetes mellitus with other neurologic complication, without long-term current use of insulin  (HCC) - Plan: POCT glucose (manual entry), Continuous Glucose Sensor (FREESTYLE LIBRE 3 SENSOR) MISC, empagliflozin  (JARDIANCE ) 10 MG TABS tablet  Type 2 diabetes mellitus with hyperglycemia, without long-term current use of insulin  (HCC) - Plan: POCT glucose (manual entry), Continuous Glucose  Sensor (FREESTYLE LIBRE 3 SENSOR) MISC, empagliflozin  (JARDIANCE ) 10 MG TABS tablet  - Unfortunately decreased control of diabetes with elevated A1c as above, some of that may be related to diet adherence, but given his age and prior CVA would like to see improved control.  Definitely below 7 and possibly below 6.5.  Option of GLP versus GIP discussed.  No family history of multiple endocrine neoplasia or medullary thyroid cancer, no personal history of pancreatitis.  He would like to try SGLT2 at this time, mycotic/UTI risks discussed with RTC precautions.  Start with Jardiance  at 10 mg/day, 6-week follow-up, continuous glucose monitor ordered.  Advised to go ahead and fill the extended release metformin  to combine with the Jardiance .  Discussed rationale for not discontinuing any meds but additive treatment with Jardiance .  Handout given on management of diabetes as well as tips for diet when he is traveling.  6-week follow-up.   Meds ordered this encounter  Medications   Continuous Glucose Sensor (FREESTYLE LIBRE 3 SENSOR) MISC    Sig: 1 Application by Does not apply route every 14 (fourteen) days. Place 1 sensor on the skin every 14 days. Use to check glucose continuously    Dispense:  6 each    Refill:  1   empagliflozin  (JARDIANCE ) 10 MG TABS tablet    Sig: Take 1 tablet (10 mg total) by mouth daily.    Dispense:  90 tablet    Refill:  1   Patient Instructions  See below on info on diet with diabetes and when traveling.  Start Jardiance  once per day and fill the extended release metformin .  I will order the continuous glucose monitor. If not covered, check blood sugars fasting or 2 hours after meal.  Recheck in 6 weeks.  Let me know if there are questions in the meantime.   Empagliflozin  Tablets What  is this medication? EMPAGLIFLOZIN  (EM pa gli FLOE zin) treats type 2 diabetes. It works by helping your kidneys remove sugar (glucose) from your blood through the urine, which decreases  your blood sugar. It may also be used to lower the risk of worsening disease and death caused by kidney disease and heart failure. It works by helping your kidneys remove salt (sodium) from your blood through the urine. This decreases the amount of work the kidneys and heart have to do. Changes to diet and exercise are often combined with this medication. This medicine may be used for other purposes; ask your health care provider or pharmacist if you have questions. COMMON BRAND NAME(S): Jardiance  What should I tell my care team before I take this medication? They need to know if you have any of these conditions: Change in diet, eating less Changes to your insulin  dose Dehydration Diet low in salt Frequently drink alcohol Having surgery History of amputation History of diabetic ketoacidosis (DKA) History of foot sores caused by diabetes History of genital infections History of pancreatitis or pancreas problems History of urinary tract infections (UTI) Kidney disease Liver disease Nerve condition that causes pain, tingling, or numbness in the hands or feet Peripheral vascular disease, narrowing of the blood vessels Serious infection Trouble passing urine Type 1 diabetes An unusual or allergic reaction to empagliflozin , other medications, foods, dyes, or preservatives Pregnant or trying to get pregnant Breastfeeding How should I use this medication? Take this medication by mouth with water. Take it as directed on the prescription label at the same time every day. You may take it with or without food. Keep taking it unless your care team tells you to stop. A special MedGuide will be given to you by the pharmacist with each prescription and refill. Be sure to read this information carefully each time. Talk to your care team about the use of this medication in children. While it may be prescribed for children as young as 10 years for selected conditions, precautions do apply. Overdosage: If  you think you have taken too much of this medicine contact a poison control center or emergency room at once. NOTE: This medicine is only for you. Do not share this medicine with others. What if I miss a dose? If you miss a dose, take it as soon as you can. If it is almost time for your next dose, take only that dose. Do not take double or extra doses. What may interact with this medication? Alcohol Diuretics Insulin  Lithium This list may not describe all possible interactions. Give your health care provider a list of all the medicines, herbs, non-prescription drugs, or dietary supplements you use. Also tell them if you smoke, drink alcohol, or use illegal drugs. Some items may interact with your medicine. What should I watch for while using this medication? Visit your care team for regular checks on your progress. Tell your care team if your symptoms do not start to get better or if they get worse. You may need blood work done while you are taking this medication. If you have diabetes, your care team will monitor your HbA1C (A1C). This test shows what your average blood sugar (glucose) level was over the past 2 to 3 months. This medication may increase the risk of diabetic ketoacidosis (DKA), a serious condition in which high ketone levels make your blood too acidic. Your care team may ask you to check for ketones in your urine or blood while you are taking this medication.  If you develop nausea, vomiting, stomach pain, unusual weakness or fatigue, or trouble breathing, stop taking this medication and call your care team right away. Check with your care team if you have severe diarrhea, nausea, and vomiting, or if you sweat a lot. The loss of too much body fluid may make it dangerous for you to take this medication. Know the symptoms of low blood sugar and know how to treat it. Always carry a source of quick sugar with you. Examples include hard sugar candy or glucose tablets. Make sure others know  that you can choke if you eat or drink if your blood sugar is too low and you are unable to care for yourself. Get medical help at once. Tell your care team if you have high blood sugar. Your medication dose may change if your body is under stress. Some types of stress that may affect your blood sugar include fever, infection, and surgery. If you have diabetes, you may get a false-positive result for sugar in your urine while you are taking this medication. Check with your care team. What side effects may I notice from receiving this medication? Side effects that you should report to your care team as soon as possible: Allergic reactions--skin rash, itching, hives, swelling of the face, lips, tongue, or throat Dehydration--increased thirst, dry mouth, feeling faint or lightheaded, headache, dark yellow or brown urine Diabetic ketoacidosis (DKA)--increased thirst or amount of urine, dry mouth, fatigue, fruity odor to breath, trouble breathing, stomach pain, nausea, vomiting Genital yeast infection--redness, swelling, pain, or itchiness, odor, thick or lumpy discharge New pain or tenderness, change in skin color, sores or ulcers, infection of the leg or foot Infection or redness, swelling, tenderness, or pain in the genitals, or area from the genitals to the back of the rectum Urinary tract infection (UTI)--burning when passing urine, passing frequent small amounts of urine, bloody or cloudy urine, pain in the lower back or sides This list may not describe all possible side effects. Call your doctor for medical advice about side effects. You may report side effects to FDA at 1-800-FDA-1088. Where should I keep my medication? Keep out of the reach of children and pets. Store at room temperature between 20 and 25 degrees C (68 and 77 degrees F). Get rid of any unused medication after the expiration date. To get rid of medications that are no longer needed or have expired: Take the medication to a  medication take-back program. Check with your pharmacy or law enforcement to find a location. If you cannot return the medication, check the label or package insert to see if the medication should be thrown out in the garbage or flushed down the toilet. If you are not sure, ask your care team. If it is safe to put it in the trash, take the medication out of the container. Mix the medication with cat litter, dirt, coffee grounds, or other unwanted substance. Seal the mixture in a bag or container. Put it in the trash. NOTE: This sheet is a summary. It may not cover all possible information. If you have questions about this medicine, talk to your doctor, pharmacist, or health care provider.  2024 Elsevier/Gold Standard (2023-07-03 00:00:00)   Type 2 Diabetes Mellitus, Self-Care, Adult Caring for yourself after you have been diagnosed with type 2 diabetes (type 2 diabetes mellitus) means keeping your blood sugar (glucose) under control with a balance of: Nutrition. Exercise. Lifestyle changes. Medicines or insulin , if needed. Support from your team of health  care providers and others. What are the risks? Having type 2 diabetes can put you at risk for other long-term (chronic) conditions, such as heart disease and kidney disease. Your health care provider may prescribe medicines to help prevent complications from diabetes. How to monitor your blood glucose  Check your blood glucose every day or as often as told by your health care provider. Have your A1C (hemoglobin A1C) level checked two or more times a year, or as often as told by your health care provider. Your health care provider will set personalized treatment goals for you. Generally, the goal of treatment is to maintain the following blood glucose levels: Before meals: 80-130 mg/dL (4.4-7.2 mmol/L). After meals: below 180 mg/dL (10 mmol/L). A1C level: less than 7%. How to manage hyperglycemia and hypoglycemia Hyperglycemia  symptoms Hyperglycemia, also called high blood glucose, occurs when blood glucose is too high. Make sure you know the early signs of hyperglycemia, such as: Increased thirst. Hunger. Feeling very tired. Needing to urinate more often than usual. Blurry vision. Hypoglycemia symptoms Hypoglycemia, also called low blood glucose, occurs with a blood glucose level at or below 70 mg/dL (3.9 mmol/L). Diabetes medicines lower your blood glucose and can cause hypoglycemia. The risk for hypoglycemia increases during or after exercise, during sleep, during illness, and when skipping meals or not eating for a long time (fasting). It is important to know the symptoms of hypoglycemia and treat it right away. Always have a 15-gram rapid-acting carbohydrate snack with you to treat low blood glucose. Family members and close friends should also know the symptoms and understand how to treat hypoglycemia, in case you are not able to treat yourself. Symptoms may include: Hunger. Anxiety. Sweating and feeling clammy. Dizziness or feeling light-headed. Sleepiness. Increased heart rate. Irritability. Tingling or numbness around the mouth, lips, or tongue. Restless sleep. Severe hypoglycemia is when your blood glucose level is at or below 54 mg/dL (3 mmol/L). Severe hypoglycemia is an emergency. Do not wait to see if the symptoms will go away. Get medical help right away. Call your local emergency services (911 in the [place] Do not drive yourself to the hospital. If you have severe hypoglycemia and you cannot eat or drink, you may need glucagon. A family member or close friend should learn how to check your blood glucose and how to give you glucagon. Ask your health care provider if you need to have an emergency glucagon kit available. Follow these instructions at home: Medicines Take prescribed insulin  or diabetes medicines as told by your health care provider. Do not run out of insulin  or other diabetes medicines.  Plan ahead so you always have these available. If you use insulin , adjust your dosage based on your physical activity and what foods you eat. Your health care provider will tell you how to adjust your dosage. Take over-the-counter and prescription medicines only as told by your health care provider. Eating and drinking  What you eat and drink affects your blood glucose and your insulin  dosage. Making good choices helps to control your diabetes and prevent other health problems. A healthy meal plan includes eating lean proteins, complex carbohydrates, fresh fruits and vegetables, low-fat dairy products, and healthy fats. Make an appointment to see a registered dietitian to help you create an eating plan that is right for you. Make sure that you: Follow instructions from your health care provider about eating or drinking restrictions. Drink enough fluid to keep your urine pale yellow. Keep a record of the carbohydrates  that you eat. Do this by reading food labels and learning the standard serving sizes of foods. Follow your sick-day plan whenever you cannot eat or drink as usual. Make this plan in advance with your health care provider.  Activity Stay active. Exercise regularly, as told by your health care provider. This may include: Stretching and doing strength exercises, such as yoga or weight lifting, two or more times a week. Doing 150 minutes or more of moderate-intensity or vigorous-intensity exercise each week. This could be brisk walking, biking, or water aerobics. Spread out your activity over 3 or more days of the week. Do not go more than 2 days in a row without doing some kind of physical activity. When you start a new exercise or activity, work with your health care provider to adjust your insulin , medicines, or food intake as needed. Lifestyle Do not use any products that contain nicotine or tobacco. These products include cigarettes, chewing tobacco, and vaping devices, such as  e-cigarettes. If you need help quitting, ask your health care provider. If you drink alcohol and your health care provider says that it is safe for you: Limit how much you have to: 0-1 drink a day for women who are not pregnant. 0-2 drinks a day for men. Know how much alcohol is in your drink. In the [place] one drink equals one 12 oz bottle of beer (355 mL), one 5 oz glass of wine (148 mL), or one 1 oz glass of hard liquor (44 mL). Learn to manage stress. If you need help with this, ask your health care provider. Take care of your body  Keep your immunizations up to date. In addition to getting vaccinations as told by your health care provider, it is recommended that you get vaccinated against the following illnesses: The flu (influenza). Get a flu shot every year. Pneumonia. Hepatitis B. Schedule an eye exam soon after your diagnosis, and then one time every year after that. Check your skin and feet every day for cuts, bruises, redness, blisters, or sores. Schedule a foot exam with your health care provider once every year. Brush your teeth and gums two times a day, and floss one or more times a day. Visit your dentist one or more times every 6 months. Maintain a healthy weight. General instructions Share your diabetes management plan with people in your workplace, school, and household. Carry a medical alert card or wear medical alert jewelry. Keep all follow-up visits. This is important. Questions to ask your health care provider Should I meet with a certified diabetes care and education specialist? Where can I find a support group for people with diabetes? Where to find more information For help and guidance and for more information about diabetes, please visit: American Diabetes Association (ADA): www.diabetes.org American Association of Diabetes Care and Education Specialists (ADCES): www.diabeteseducator.org International Diabetes Federation (IDF): DCOnly.dk Summary Caring for  yourself after you have been diagnosed with type 2 diabetes (type 2 diabetes mellitus) means keeping your blood sugar (glucose) under control with a balance of nutrition, exercise, lifestyle changes, and medicine. Check your blood glucose every day, as often as told by your health care provider. Having diabetes can put you at risk for other long-term (chronic) conditions, such as heart disease and kidney disease. Your health care provider may prescribe medicines to help prevent complications from diabetes. Share your diabetes management plan with people in your workplace, school, and household. Keep all follow-up visits. This is important. This information is not intended to  replace advice given to you by your health care provider. Make sure you discuss any questions you have with your health care provider. Document Revised: 12/19/2020 Document Reviewed: 12/19/2020 Elsevier Patient Education  2024 [employer].  Tips for Eating Away From Home If You Have Diabetes Mellitus Managing your blood sugar (glucose) levels can be challenging when you do not prepare your own meals. The following tips can help you manage your diabetes when you eat away from home. If you have questions or if you need help, work with your health care provider or dietitian. How to plan ahead Plan ahead if you know you will be eating away from home. To do this: Try to eat your meals and snacks at about the same time each day. If you know your meal is going to be later than normal, make sure you have a small snack. Being very hungry can cause you to make unhealthy food choices. Make a list of restaurants near you that offer healthy choices. If a restaurant has a M[employer], take the menu home and plan what you will order ahead of time. Look up the restaurant you want to eat at online. Many chain and fast-food restaurants list nutritional information online. Use this information to choose the healthiest options and to calculate  how many carbohydrates will be in your meal. Use a carbohydrate-counting book or mobile app to look up the carbohydrate content and serving sizes of the foods you want to eat. How to get more free foods in your diet A free food is any food or drink that has less than 5 grams of carbohydrates and fewer than 20 calories per serving. These foods are high in fiber and nutrients and low in calories, carbohydrates, and fats. Free foods include: Non-starchy vegetables, such as carrots, broccoli, celery, lettuce, or green beans. Non-sugar drinks, such as water, unsweetened coffee, or unsweetened tea. Low-calorie salad dressings. Sugar-free gelatin. Starting meals with a salad full of vegetables is a healthy choice that includes a lot of free foods. Avoid high-calorie salad toppings, such as bacon, cheese, and high-fat dressings. Ask for your salad dressing to be served on the side so that you dip your fork in the dressing and then in the salad. This allows you to control how much dressing you eat and still get the flavor with every bite. How to control the amount of carbohydrates you have  Ask your server to take away the bread basket or chips from your table. Choose light yogurt or [place] yogurt instead of nonfat sweetened yogurt. Order fresh fruit. A salad bar often offers fresh fruit choices. Avoid canned fruit because it is usually packed in sugar or syrup. Order a salad and ask for dressing on the side. Ask for substitutes. For example, if your meal comes with french fries, ask for a side salad or steamed veggies instead. If a meal comes with fried chicken, ask for grilled chicken instead. How to know which beverages to choose or avoid Choose drinks that are low in calories and sugar, such as: Water. Unsweetened tea or coffee. Low-fat milk. Avoid the following drinks: Alcoholic beverages. Regular (not diet) sodas. General tips If you take insulin , wait to take your insulin  until your food  arrives to your table. This will ensure that your insulin  and your food are timed correctly. Become familiar with serving sizes and learn to recognize how many servings are in a portion. Restaurant portions are typically two to three times larger than what you really  need. Ask your server for a to-go box at the beginning of the meal. When your food comes, leave the amount you should have on your plate, and put the rest in the to-go box so that you are not tempted to eat too much. Consider splitting an entre with someone and ordering a side salad. Avoid buffets. They are typically too tempting and result in overeating. Where to find more information American Diabetes Association: diabetes.org Association of Diabetes Care & Education Specialists: diabeteseducator.org Academy of Nutrition and Dietetics: eatright.org Summary If you have diabetes, plan ahead when eating away from home. Try to eat your meals and snacks at about the same time each day. If you know your meal is going to be later than normal, make sure you have a small snack. Being very hungry can cause you to make unhealthy food choices. To control the amount of carbohydrates you have, ask for substitutes. For example, if your meal comes with french fries, ask for a side salad or steamed veggies instead. If a meal comes with fried chicken, ask for grilled chicken instead. Ask for a to-go box when you order your meal. Divide your meal before you start eating. This information is not intended to replace advice given to you by your health care provider. Make sure you discuss any questions you have with your health care provider. Document Revised: 02/21/2020 Document Reviewed: 02/22/2020 Elsevier Patient Education  2024 [employer]            Signed,   Reyes Pines, MD [facility], [facility] 02/11/24 4:48 PM

## 2024-02-11 NOTE — Patient Instructions (Addendum)
 See below on info on diet with diabetes and when traveling.  Start Jardiance  once per day and fill the extended release metformin .  I will order the continuous glucose monitor. If not covered, check blood sugars fasting or 2 hours after meal.  Recheck in 6 weeks.  Let me know if there are questions in the meantime.   Empagliflozin  Tablets What is this medication? EMPAGLIFLOZIN  (EM pa gli FLOE zin) treats type 2 diabetes. It works by helping your kidneys remove sugar (glucose) from your blood through the urine, which decreases your blood sugar. It may also be used to lower the risk of worsening disease and death caused by kidney disease and heart failure. It works by helping your kidneys remove salt (sodium) from your blood through the urine. This decreases the amount of work the kidneys and heart have to do. Changes to diet and exercise are often combined with this medication. This medicine may be used for other purposes; ask your health care provider or pharmacist if you have questions. COMMON BRAND NAME(S): Jardiance  What should I tell my care team before I take this medication? They need to know if you have any of these conditions: Change in diet, eating less Changes to your insulin  dose Dehydration Diet low in salt Frequently drink alcohol Having surgery History of amputation History of diabetic ketoacidosis (DKA) History of foot sores caused by diabetes History of genital infections History of pancreatitis or pancreas problems History of urinary tract infections (UTI) Kidney disease Liver disease Nerve condition that causes pain, tingling, or numbness in the hands or feet Peripheral vascular disease, narrowing of the blood vessels Serious infection Trouble passing urine Type 1 diabetes An unusual or allergic reaction to empagliflozin , other medications, foods, dyes, or preservatives Pregnant or trying to get pregnant Breastfeeding How should I use this medication? Take this  medication by mouth with water. Take it as directed on the prescription label at the same time every day. You may take it with or without food. Keep taking it unless your care team tells you to stop. A special MedGuide will be given to you by the pharmacist with each prescription and refill. Be sure to read this information carefully each time. Talk to your care team about the use of this medication in children. While it may be prescribed for children as young as 10 years for selected conditions, precautions do apply. Overdosage: If you think you have taken too much of this medicine contact a poison control center or emergency room at once. NOTE: This medicine is only for you. Do not share this medicine with others. What if I miss a dose? If you miss a dose, take it as soon as you can. If it is almost time for your next dose, take only that dose. Do not take double or extra doses. What may interact with this medication? Alcohol Diuretics Insulin  Lithium This list may not describe all possible interactions. Give your health care provider a list of all the medicines, herbs, non-prescription drugs, or dietary supplements you use. Also tell them if you smoke, drink alcohol, or use illegal drugs. Some items may interact with your medicine. What should I watch for while using this medication? Visit your care team for regular checks on your progress. Tell your care team if your symptoms do not start to get better or if they get worse. You may need blood work done while you are taking this medication. If you have diabetes, your care team will monitor your HbA1C (  A1C). This test shows what your average blood sugar (glucose) level was over the past 2 to 3 months. This medication may increase the risk of diabetic ketoacidosis (DKA), a serious condition in which high ketone levels make your blood too acidic. Your care team may ask you to check for ketones in your urine or blood while you are taking this  medication. If you develop nausea, vomiting, stomach pain, unusual weakness or fatigue, or trouble breathing, stop taking this medication and call your care team right away. Check with your care team if you have severe diarrhea, nausea, and vomiting, or if you sweat a lot. The loss of too much body fluid may make it dangerous for you to take this medication. Know the symptoms of low blood sugar and know how to treat it. Always carry a source of quick sugar with you. Examples include hard sugar candy or glucose tablets. Make sure others know that you can choke if you eat or drink if your blood sugar is too low and you are unable to care for yourself. Get medical help at once. Tell your care team if you have high blood sugar. Your medication dose may change if your body is under stress. Some types of stress that may affect your blood sugar include fever, infection, and surgery. If you have diabetes, you may get a false-positive result for sugar in your urine while you are taking this medication. Check with your care team. What side effects may I notice from receiving this medication? Side effects that you should report to your care team as soon as possible: Allergic reactions--skin rash, itching, hives, swelling of the face, lips, tongue, or throat Dehydration--increased thirst, dry mouth, feeling faint or lightheaded, headache, dark yellow or brown urine Diabetic ketoacidosis (DKA)--increased thirst or amount of urine, dry mouth, fatigue, fruity odor to breath, trouble breathing, stomach pain, nausea, vomiting Genital yeast infection--redness, swelling, pain, or itchiness, odor, thick or lumpy discharge New pain or tenderness, change in skin color, sores or ulcers, infection of the leg or foot Infection or redness, swelling, tenderness, or pain in the genitals, or area from the genitals to the back of the rectum Urinary tract infection (UTI)--burning when passing urine, passing frequent small amounts of  urine, bloody or cloudy urine, pain in the lower back or sides This list may not describe all possible side effects. Call your doctor for medical advice about side effects. You may report side effects to FDA at 1-800-FDA-1088. Where should I keep my medication? Keep out of the reach of children and pets. Store at room temperature between 20 and 25 degrees C (68 and 77 degrees F). Get rid of any unused medication after the expiration date. To get rid of medications that are no longer needed or have expired: Take the medication to a medication take-back program. Check with your pharmacy or law enforcement to find a location. If you cannot return the medication, check the label or package insert to see if the medication should be thrown out in the garbage or flushed down the toilet. If you are not sure, ask your care team. If it is safe to put it in the trash, take the medication out of the container. Mix the medication with cat litter, dirt, coffee grounds, or other unwanted substance. Seal the mixture in a bag or container. Put it in the trash. NOTE: This sheet is a summary. It may not cover all possible information. If you have questions about this medicine, talk to your  doctor, pharmacist, or health care provider.  2024 Elsevier/Gold Standard (2023-07-03 00:00:00)   Type 2 Diabetes Mellitus, Self-Care, Adult Caring for yourself after you have been diagnosed with type 2 diabetes (type 2 diabetes mellitus) means keeping your blood sugar (glucose) under control with a balance of: Nutrition. Exercise. Lifestyle changes. Medicines or insulin , if needed. Support from your team of health care providers and others. What are the risks? Having type 2 diabetes can put you at risk for other long-term (chronic) conditions, such as heart disease and kidney disease. Your health care provider may prescribe medicines to help prevent complications from diabetes. How to monitor your blood glucose  Check your  blood glucose every day or as often as told by your health care provider. Have your A1C (hemoglobin A1C) level checked two or more times a year, or as often as told by your health care provider. Your health care provider will set personalized treatment goals for you. Generally, the goal of treatment is to maintain the following blood glucose levels: Before meals: 80-130 mg/dL (4.4-7.2 mmol/L). After meals: below 180 mg/dL (10 mmol/L). A1C level: less than 7%. How to manage hyperglycemia and hypoglycemia Hyperglycemia symptoms Hyperglycemia, also called high blood glucose, occurs when blood glucose is too high. Make sure you know the early signs of hyperglycemia, such as: Increased thirst. Hunger. Feeling very tired. Needing to urinate more often than usual. Blurry vision. Hypoglycemia symptoms Hypoglycemia, also called low blood glucose, occurs with a blood glucose level at or below 70 mg/dL (3.9 mmol/L). Diabetes medicines lower your blood glucose and can cause hypoglycemia. The risk for hypoglycemia increases during or after exercise, during sleep, during illness, and when skipping meals or not eating for a long time (fasting). It is important to know the symptoms of hypoglycemia and treat it right away. Always have a 15-gram rapid-acting carbohydrate snack with you to treat low blood glucose. Family members and close friends should also know the symptoms and understand how to treat hypoglycemia, in case you are not able to treat yourself. Symptoms may include: Hunger. Anxiety. Sweating and feeling clammy. Dizziness or feeling light-headed. Sleepiness. Increased heart rate. Irritability. Tingling or numbness around the mouth, lips, or tongue. Restless sleep. Severe hypoglycemia is when your blood glucose level is at or below 54 mg/dL (3 mmol/L). Severe hypoglycemia is an emergency. Do not wait to see if the symptoms will go away. Get medical help right away. Call your local emergency  services (911 in the [place] Do not drive yourself to the hospital. If you have severe hypoglycemia and you cannot eat or drink, you may need glucagon. A family member or close friend should learn how to check your blood glucose and how to give you glucagon. Ask your health care provider if you need to have an emergency glucagon kit available. Follow these instructions at home: Medicines Take prescribed insulin  or diabetes medicines as told by your health care provider. Do not run out of insulin  or other diabetes medicines. Plan ahead so you always have these available. If you use insulin , adjust your dosage based on your physical activity and what foods you eat. Your health care provider will tell you how to adjust your dosage. Take over-the-counter and prescription medicines only as told by your health care provider. Eating and drinking  What you eat and drink affects your blood glucose and your insulin  dosage. Making good choices helps to control your diabetes and prevent other health problems. A healthy meal plan includes eating lean proteins,  complex carbohydrates, fresh fruits and vegetables, low-fat dairy products, and healthy fats. Make an appointment to see a registered dietitian to help you create an eating plan that is right for you. Make sure that you: Follow instructions from your health care provider about eating or drinking restrictions. Drink enough fluid to keep your urine pale yellow. Keep a record of the carbohydrates that you eat. Do this by reading food labels and learning the standard serving sizes of foods. Follow your sick-day plan whenever you cannot eat or drink as usual. Make this plan in advance with your health care provider.  Activity Stay active. Exercise regularly, as told by your health care provider. This may include: Stretching and doing strength exercises, such as yoga or weight lifting, two or more times a week. Doing 150 minutes or more of moderate-intensity  or vigorous-intensity exercise each week. This could be brisk walking, biking, or water aerobics. Spread out your activity over 3 or more days of the week. Do not go more than 2 days in a row without doing some kind of physical activity. When you start a new exercise or activity, work with your health care provider to adjust your insulin , medicines, or food intake as needed. Lifestyle Do not use any products that contain nicotine or tobacco. These products include cigarettes, chewing tobacco, and vaping devices, such as e-cigarettes. If you need help quitting, ask your health care provider. If you drink alcohol and your health care provider says that it is safe for you: Limit how much you have to: 0-1 drink a day for women who are not pregnant. 0-2 drinks a day for men. Know how much alcohol is in your drink. In the [place] one drink equals one 12 oz bottle of beer (355 mL), one 5 oz glass of wine (148 mL), or one 1 oz glass of hard liquor (44 mL). Learn to manage stress. If you need help with this, ask your health care provider. Take care of your body  Keep your immunizations up to date. In addition to getting vaccinations as told by your health care provider, it is recommended that you get vaccinated against the following illnesses: The flu (influenza). Get a flu shot every year. Pneumonia. Hepatitis B. Schedule an eye exam soon after your diagnosis, and then one time every year after that. Check your skin and feet every day for cuts, bruises, redness, blisters, or sores. Schedule a foot exam with your health care provider once every year. Brush your teeth and gums two times a day, and floss one or more times a day. Visit your dentist one or more times every 6 months. Maintain a healthy weight. General instructions Share your diabetes management plan with people in your workplace, school, and household. Carry a medical alert card or wear medical alert jewelry. Keep all follow-up visits.  This is important. Questions to ask your health care provider Should I meet with a certified diabetes care and education specialist? Where can I find a support group for people with diabetes? Where to find more information For help and guidance and for more information about diabetes, please visit: American Diabetes Association (ADA): www.diabetes.org American Association of Diabetes Care and Education Specialists (ADCES): www.diabeteseducator.org International Diabetes Federation (IDF): DCOnly.dk Summary Caring for yourself after you have been diagnosed with type 2 diabetes (type 2 diabetes mellitus) means keeping your blood sugar (glucose) under control with a balance of nutrition, exercise, lifestyle changes, and medicine. Check your blood glucose every day, as often as told  by your health care provider. Having diabetes can put you at risk for other long-term (chronic) conditions, such as heart disease and kidney disease. Your health care provider may prescribe medicines to help prevent complications from diabetes. Share your diabetes management plan with people in your workplace, school, and household. Keep all follow-up visits. This is important. This information is not intended to replace advice given to you by your health care provider. Make sure you discuss any questions you have with your health care provider. Document Revised: 12/19/2020 Document Reviewed: 12/19/2020 Elsevier Patient Education  2024 [employer].  Tips for Eating Away From Home If You Have Diabetes Mellitus Managing your blood sugar (glucose) levels can be challenging when you do not prepare your own meals. The following tips can help you manage your diabetes when you eat away from home. If you have questions or if you need help, work with your health care provider or dietitian. How to plan ahead Plan ahead if you know you will be eating away from home. To do this: Try to eat your meals and snacks at about the  same time each day. If you know your meal is going to be later than normal, make sure you have a small snack. Being very hungry can cause you to make unhealthy food choices. Make a list of restaurants near you that offer healthy choices. If a restaurant has a Mudlogger, take the menu home and plan what you will order ahead of time. Look up the restaurant you want to eat at online. Many chain and fast-food restaurants list nutritional information online. Use this information to choose the healthiest options and to calculate how many carbohydrates will be in your meal. Use a carbohydrate-counting book or mobile app to look up the carbohydrate content and serving sizes of the foods you want to eat. How to get more free foods in your diet A free food is any food or drink that has less than 5 grams of carbohydrates and fewer than 20 calories per serving. These foods are high in fiber and nutrients and low in calories, carbohydrates, and fats. Free foods include: Non-starchy vegetables, such as carrots, broccoli, celery, lettuce, or green beans. Non-sugar drinks, such as water, unsweetened coffee, or unsweetened tea. Low-calorie salad dressings. Sugar-free gelatin. Starting meals with a salad full of vegetables is a healthy choice that includes a lot of free foods. Avoid high-calorie salad toppings, such as bacon, cheese, and high-fat dressings. Ask for your salad dressing to be served on the side so that you dip your fork in the dressing and then in the salad. This allows you to control how much dressing you eat and still get the flavor with every bite. How to control the amount of carbohydrates you have  Ask your server to take away the bread basket or chips from your table. Choose light yogurt or [place] yogurt instead of nonfat sweetened yogurt. Order fresh fruit. A salad bar often offers fresh fruit choices. Avoid canned fruit because it is usually packed in sugar or syrup. Order a salad and ask  for dressing on the side. Ask for substitutes. For example, if your meal comes with french fries, ask for a side salad or steamed veggies instead. If a meal comes with fried chicken, ask for grilled chicken instead. How to know which beverages to choose or avoid Choose drinks that are low in calories and sugar, such as: Water. Unsweetened tea or coffee. Low-fat milk. Avoid the following drinks: Alcoholic beverages.  Regular (not diet) sodas. General tips If you take insulin , wait to take your insulin  until your food arrives to your table. This will ensure that your insulin  and your food are timed correctly. Become familiar with serving sizes and learn to recognize how many servings are in a portion. Restaurant portions are typically two to three times larger than what you really need. Ask your server for a to-go box at the beginning of the meal. When your food comes, leave the amount you should have on your plate, and put the rest in the to-go box so that you are not tempted to eat too much. Consider splitting an entre with someone and ordering a side salad. Avoid buffets. They are typically too tempting and result in overeating. Where to find more information American Diabetes Association: diabetes.org Association of Diabetes Care & Education Specialists: diabeteseducator.org Academy of Nutrition and Dietetics: eatright.org Summary If you have diabetes, plan ahead when eating away from home. Try to eat your meals and snacks at about the same time each day. If you know your meal is going to be later than normal, make sure you have a small snack. Being very hungry can cause you to make unhealthy food choices. To control the amount of carbohydrates you have, ask for substitutes. For example, if your meal comes with french fries, ask for a side salad or steamed veggies instead. If a meal comes with fried chicken, ask for grilled chicken instead. Ask for a to-go box when you order your meal.  Divide your meal before you start eating. This information is not intended to replace advice given to you by your health care provider. Make sure you discuss any questions you have with your health care provider. Document Revised: 02/21/2020 Document Reviewed: 02/22/2020 Elsevier Patient Education  2024 [employer].

## 2024-02-26 ENCOUNTER — Ambulatory Visit: Admitting: Family Medicine

## 2024-02-26 DIAGNOSIS — M25511 Pain in right shoulder: Secondary | ICD-10-CM | POA: Insufficient documentation

## 2024-04-07 ENCOUNTER — Encounter: Payer: Self-pay | Admitting: Cardiology

## 2024-05-04 ENCOUNTER — Encounter: Payer: Self-pay | Admitting: Family Medicine

## 2024-05-04 ENCOUNTER — Ambulatory Visit (INDEPENDENT_AMBULATORY_CARE_PROVIDER_SITE_OTHER): Admitting: Family Medicine

## 2024-05-04 VITALS — BP 126/82 | HR 67 | Temp 97.7°F | Ht 70.0 in | Wt 213.0 lb

## 2024-05-04 DIAGNOSIS — E1149 Type 2 diabetes mellitus with other diabetic neurological complication: Secondary | ICD-10-CM

## 2024-05-04 DIAGNOSIS — I639 Cerebral infarction, unspecified: Secondary | ICD-10-CM | POA: Diagnosis not present

## 2024-05-04 DIAGNOSIS — E1165 Type 2 diabetes mellitus with hyperglycemia: Secondary | ICD-10-CM | POA: Diagnosis not present

## 2024-05-04 DIAGNOSIS — Z125 Encounter for screening for malignant neoplasm of prostate: Secondary | ICD-10-CM

## 2024-05-04 DIAGNOSIS — Z7984 Long term (current) use of oral hypoglycemic drugs: Secondary | ICD-10-CM

## 2024-05-04 DIAGNOSIS — E785 Hyperlipidemia, unspecified: Secondary | ICD-10-CM

## 2024-05-04 DIAGNOSIS — Z0000 Encounter for general adult medical examination without abnormal findings: Secondary | ICD-10-CM

## 2024-05-04 DIAGNOSIS — Z1211 Encounter for screening for malignant neoplasm of colon: Secondary | ICD-10-CM

## 2024-05-04 DIAGNOSIS — I10 Essential (primary) hypertension: Secondary | ICD-10-CM

## 2024-05-04 MED ORDER — ATORVASTATIN CALCIUM 40 MG PO TABS: 40.0000 mg | ORAL_TABLET | Freq: Every day | ORAL | 1 refills | Status: AC

## 2024-05-04 MED ORDER — EMPAGLIFLOZIN 10 MG PO TABS: 10.0000 mg | ORAL_TABLET | Freq: Every day | ORAL | 1 refills | Status: DC

## 2024-05-04 MED ORDER — FREESTYLE LIBRE 3 SENSOR MISC: 1.0000 | 1 refills | Status: AC

## 2024-05-04 MED ORDER — AMLODIPINE BESYLATE 2.5 MG PO TABS: 2.5000 mg | ORAL_TABLET | Freq: Every day | ORAL | 3 refills | Status: AC

## 2024-05-04 MED ORDER — METFORMIN HCL ER 500 MG PO TB24: 1000.0000 mg | ORAL_TABLET | Freq: Every day | ORAL | 2 refills | Status: AC

## 2024-05-04 NOTE — Patient Instructions (Signed)
 Thanks for coming in today.  I did order the Cologuard for colon cancer screening.  Labs at a fasting lab visit this Friday and will adjust meds accordingly.  No changes for now.  See information below on hepatitis B vaccine.  If that something you would like to have, we can perform that at a nurse visit.  Follow-up with me in 3 months for diabetes, happy to see you sooner if any concerns.  Take care!  Hepatitis B Vaccine Injection What is this medication? HEPATITIS B VACCINE (hep uh TAHY tis B vak SEEN) reduces the risk of hepatitis B. It does not treat hepatitis B. It is still possible to get hepatitis B after receiving this vaccine, but the symptoms may be less severe or not last as long. It works by helping your immune system learn how to fight off a future infection. This medicine may be used for other purposes; ask your health care provider or pharmacist if you have questions. COMMON BRAND NAME(S): Engerix-B, Engerix-B Pediatric, HEPLISAV-B, PreHevbrio, Recombivax HB, Recombivax HB Pediatric/Adolescent What should I tell my care team before I take this medication? They need to know if you have any of these conditions: Fever, infection Heart disease Hepatitis B infection Immune system problems Kidney disease An unusual or allergic reaction to vaccines, other medications, foods, dyes, or preservatives Pregnant or trying to get pregnant Breastfeeding How should I use this medication? This vaccine is injected into a muscle. It is given by your care team. A copy of Vaccine Information Statements will be given before each vaccination. Be sure to read this sheet carefully each time. This sheet may change often. Talk to your care team about the use of this medication in children. While it may be prescribed for children as young as newborn for selected conditions, precautions do apply. Overdosage: If you think you have taken too much of this medicine contact a poison control center or emergency  room at once. NOTE: This medicine is only for you. Do not share this medicine with others. What if I miss a dose? Keep appointments for follow-up (booster) doses as directed. It is important not to miss your dose. Call your care team if you are unable to keep an appointment. What may interact with this medication? Certain medications that suppress your immune function, such as adalimumab, anakinra, infliximab Certain medications to treat cancer Steroid medications, such as prednisone or cortisone This list may not describe all possible interactions. Give your health care provider a list of all the medicines, herbs, non-prescription drugs, or dietary supplements you use. Also tell them if you smoke, drink alcohol, or use illegal drugs. Some items may interact with your medicine. What should I watch for while using this medication? See your care team for all shots of this vaccine as directed. You must have 3 shots of this vaccine for protection from hepatitis B infection. Tell your care team right away if you have any serious or unusual side effects after getting this vaccine. What side effects may I notice from receiving this medication? Side effects that you should report to your care team as soon as possible: Allergic reactions--skin rash, itching, hives, swelling of the face, lips, tongue, or throat Burning or tingling sensation in hands or feet Feeling faint or lightheaded Trouble breathing Side effects that usually do not require medical attention (report these to your care team if they continue or are bothersome): Dizziness Fatigue Fever Headache Pain, redness, irritation, or bruising at the injection site This list  may not describe all possible side effects. Call your doctor for medical advice about side effects. You may report side effects to FDA at 1-800-FDA-1088. Where should I keep my medication? This vaccine is only given by your care team. It will not be stored at home. NOTE:  This sheet is a summary. It may not cover all possible information. If you have questions about this medicine, talk to your doctor, pharmacist, or health care provider.  2024 Elsevier/Gold Standard (2021-12-31 00:00:00)Preventive Care 48-55 Years Old, Male Preventive care refers to lifestyle choices and visits with your health care provider that can promote health and wellness. Preventive care visits are also called wellness exams. What can I expect for my preventive care visit? Counseling During your preventive care visit, your health care provider may ask about your: Medical history, including: Past medical problems. Family medical history. Current health, including: Emotional well-being. Home life and relationship well-being. Sexual activity. Lifestyle, including: Alcohol, nicotine or tobacco, and drug use. Access to firearms. Diet, exercise, and sleep habits. Safety issues such as seatbelt and bike helmet use. Sunscreen use. Work and work Astronomer. Physical exam Your health care provider will check your: Height and weight. These may be used to calculate your BMI (body mass index). BMI is a measurement that tells if you are at a healthy weight. Waist circumference. This measures the distance around your waistline. This measurement also tells if you are at a healthy weight and may help predict your risk of certain diseases, such as type 2 diabetes and high blood pressure. Heart rate and blood pressure. Body temperature. Skin for abnormal spots. What immunizations do I need?  Vaccines are usually given at various ages, according to a schedule. Your health care provider will recommend vaccines for you based on your age, medical history, and lifestyle or other factors, such as travel or where you work. What tests do I need? Screening Your health care provider may recommend screening tests for certain conditions. This may include: Lipid and cholesterol levels. Diabetes screening.  This is done by checking your blood sugar (glucose) after you have not eaten for a while (fasting). Hepatitis B test. Hepatitis C test. HIV (human immunodeficiency virus) test. STI (sexually transmitted infection) testing, if you are at risk. Lung cancer screening. Prostate cancer screening. Colorectal cancer screening. Talk with your health care provider about your test results, treatment options, and if necessary, the need for more tests. Follow these instructions at home: Eating and drinking  Eat a diet that includes fresh fruits and vegetables, whole grains, lean protein, and low-fat dairy products. Take vitamin and mineral supplements as recommended by your health care provider. Do not drink alcohol if your health care provider tells you not to drink. If you drink alcohol: Limit how much you have to 0-2 drinks a day. Know how much alcohol is in your drink. In the [place] one drink equals one 12 oz bottle of beer (355 mL), one 5 oz glass of wine (148 mL), or one 1 oz glass of hard liquor (44 mL). Lifestyle Brush your teeth every morning and night with fluoride toothpaste. Floss one time each day. Exercise for at least 30 minutes 5 or more days each week. Do not use any products that contain nicotine or tobacco. These products include cigarettes, chewing tobacco, and vaping devices, such as e-cigarettes. If you need help quitting, ask your health care provider. Do not use drugs. If you are sexually active, practice safe sex. Use a condom or other form of  protection to prevent STIs. Take aspirin  only as told by your health care provider. Make sure that you understand how much to take and what form to take. Work with your health care provider to find out whether it is safe and beneficial for you to take aspirin  daily. Find healthy ways to manage stress, such as: Meditation, yoga, or listening to music. Journaling. Talking to a trusted person. Spending time with friends and  family. Minimize exposure to UV radiation to reduce your risk of skin cancer. Safety Always wear your seat belt while driving or riding in a vehicle. Do not drive: If you have been drinking alcohol. Do not ride with someone who has been drinking. When you are tired or distracted. While texting. If you have been using any mind-altering substances or drugs. Wear a helmet and other protective equipment during sports activities. If you have firearms in your house, make sure you follow all gun safety procedures. What's next? Go to your health care provider once a year for an annual wellness visit. Ask your health care provider how often you should have your eyes and teeth checked. Stay up to date on all vaccines. This information is not intended to replace advice given to you by your health care provider. Make sure you discuss any questions you have with your health care provider. Document Revised: 01/16/2021 Document Reviewed: 01/16/2021 Elsevier Patient Education  2024 [employer].

## 2024-05-04 NOTE — Progress Notes (Unsigned)
 Subjective:  Patient ID: Brian Reid, male    DOB: [birth year]  Age: 48 y.o. MRN: [id number]  CC:  Chief Complaint  Patient presents with   Annual Exam    No questions or concerns.    Diabetes    Patient has not picked up [place] sensor.     HPI Brian Reid presents for Annual Exam  PCP, [facility], Dr. Lavona.  History of CVA, small PFO cannot be excluded with shunting on prior TEE.  Cardiac event monitor in January with predominant sinus rhythm, rare PVCs.  Continued on aspirin  and atorvastatin  for his hyperlipidemia. Neuro/sleep specialist, Dr. Buck, plan for home sleep testing for possible sleep apnea. Has not yet performed. Advised to have this done.   Hypertension: Amlodipine  2.5 mg daily. Home readings: BP Readings from Last 3 Encounters:  05/04/24 126/82  02/11/24 122/68  02/03/24 128/72   Lab Results  Component Value Date   CREATININE 0.95 02/03/2024      Diabetes: Complicated by history of CVA in December 2024.  Metformin  500 mg twice daily, previously, I have discussed the extended release formulation.  Unfortunately A1c was significantly higher in July at 9.5.   CGM ordered at that time for home monitoring.  Started on Jardiance  10 mg daily, extended release metformin  1000 mg daily, he is on statin with Lipitor. Try to be careful with diet when outside the home or travels with work. No mycotic or UTI sx's. No new GI side effects.  Home readings - 114 fasting yesterday, around 145 after meal.  No sx lows. Plans lab visit in next few days for updated labs.  Microalbumin: Normal ratio 02/03/24 Optho, foot exam, pneumovax:  Hepatitis C screening: agrees on test.  Colon cancer screening, see below Lab Results  Component Value Date   HGBA1C 9.5 (H) 02/03/2024   HGBA1C 7.3 (H) 10/23/2023   HGBA1C 6.8 (H) 07/20/2023   Lab Results  Component Value Date   MICROALBUR 0.9 02/03/2024   LDLCALC 52 10/23/2023   CREATININE 0.95 02/03/2024     Hyperlipidemia: On Lipitor 40mg  as above.  Denies any myalgias or side effects.  LDL at a good range in March. Lab Results  Component Value Date   CHOL 120 10/23/2023   HDL 34.60 (L) 10/23/2023   LDLCALC 52 10/23/2023   LDLDIRECT 119.0 03/20/2023   TRIG 164.0 (H) 10/23/2023   CHOLHDL 3 10/23/2023   Lab Results  Component Value Date   ALT 30 02/03/2024   AST 15 02/03/2024   ALKPHOS 54 02/03/2024   BILITOT 0.7 02/03/2024          05/04/2024    8:10 AM 02/03/2024    8:43 AM 08/21/2023    9:47 AM 06/04/2023    1:05 PM 03/20/2023   10:52 AM  Depression screen PHQ 2/9  Decreased Interest 0 0 0 0 0  Down, Depressed, Hopeless 0 0 0 0 0  PHQ - 2 Score 0 0 0 0 0  Altered sleeping 0 0 0 0 0  Tired, decreased energy 0 0 0 0 0  Change in appetite 0 0 0 0 0  Feeling bad or failure about yourself  0 0 0 0 0  Trouble concentrating 0 0 0 0 0  Moving slowly or fidgety/restless 0 0 0 0 0  Suicidal thoughts 0 0 0 0 0  PHQ-9 Score 0 0 0 0 0  Difficult doing work/chores Not difficult at all Not difficult at all  Not difficult at all  Health Maintenance  Topic Date Due   Hepatitis C Screening  Never done   Fecal DNA (Cologuard)  03/22/2024   Influenza Vaccine  11/01/2024 (Originally 03/04/2024)   Hepatitis B Vaccines 19-59 Average Risk (1 of 3 - 19+ 3-dose series) 11/02/2024 (Originally 12/05/1994)   COVID-19 Vaccine (5 - 2025-26 season) 11/02/2024 (Originally 04/04/2024)   Diabetic kidney evaluation - eGFR measurement  02/02/2025   Diabetic kidney evaluation - Urine ACR  02/02/2025   DTaP/Tdap/Td (2 - Td or Tdap) 06/03/2033   Pneumococcal Vaccine  Completed   HIV Screening  Completed   HPV VACCINES  Aged Out   Meningococcal B Vaccine  Aged Out  Colon cancer screening -Cologuard - 03/22/2021, due. Screening options with colonoscopy versus Cologuard discussed. Discussed timing of repeat testing intervals if normal, as well as potential need for diagnostic Colonoscopy if positive  Cologuard. Understanding expressed, and chose Cologuard.  Prostate: does not have family history of prostate cancer The natural history of prostate cancer and ongoing controversy regarding screening and potential treatment outcomes of prostate cancer has been discussed with the patient. The meaning of a false positive PSA and a false negative PSA has been discussed. He indicates understanding of the limitations of this screening test and wishes to proceed with screening PSA testing. No results found for: PSA1, PSA  Immunization History  Administered Date(s) Administered   PFIZER Comirnaty(Gray Top)Covid-19 Tri-Sucrose Vaccine 11/15/2019, 12/06/2019, 07/31/2020   PFIZER(Purple Top)SARS-COV-2 Vaccination 07/31/2020   PNEUMOCOCCAL CONJUGATE-20 02/03/2024   Tdap 06/04/2023  Flu vaccine: declines.  Hepatitis B vaccine - titer nonimmune. He would like more info prior to hep B.  COVID booster: declines.   No results found. Wears glasses. Optho in past year.   Dental:Within Last 6 months  Alcohol: none  Tobacco: none  Exercise: few days per week - 40 min. Walking, body weight exercises.    History Patient Active Problem List   Diagnosis Date Noted   Pain in right shoulder 02/26/2024   Left Achilles tendinitis 12/07/2023   Pain of right heel 11/13/2023   HTN (hypertension) 08/25/2023   HLD (hyperlipidemia) 08/25/2023   Snoring 08/25/2023   TIA (transient ischemic attack) 07/06/2023   Elevated BP without diagnosis of hypertension 07/06/2023   Pain in left hand 09/09/2021   Left forearm pain 04/12/2021   Acute appendicitis s/p lap appendectomy 04/02/2016 04/03/2016   Past Medical History:  Diagnosis Date   Asthma    Diabetes mellitus without complication (HCC) 11-02-2021   HLD (hyperlipidemia) 08/25/2023   HTN (hypertension) 08/25/2023   Stroke (HCC) 07-06-2023   Past Surgical History:  Procedure Laterality Date   APPENDECTOMY N/A    [employer] 06/27/2020   LAPAROSCOPIC  APPENDECTOMY N/A 04/02/2016   Procedure: APPENDECTOMY LAPAROSCOPIC;  Surgeon: Deward Null III, MD;  Location: [facility];  Service: General;  Laterality: N/A;   LEG SURGERY     age 39   OPEN REDUCTION SHOULDER DISLOCATION     age 47   TRANSESOPHAGEAL ECHOCARDIOGRAM (CATH LAB) N/A 07/16/2023   Procedure: TRANSESOPHAGEAL ECHOCARDIOGRAM;  Surgeon: Sheena Pugh, DO;  Location: [facility];  Service: Cardiovascular;  Laterality: N/A;   No Known Allergies Prior to Admission medications   Medication Sig Start Date End Date Taking? Authorizing Provider  amLODipine  (NORVASC ) 2.5 MG tablet Take 1 tablet (2.5 mg total) by mouth daily. 10/23/23 05/04/24 Yes Levora Reyes SAUNDERS, MD  aspirin  EC 81 MG tablet Take 81 mg by mouth daily. Swallow whole.   Yes [provider]  atorvastatin  (LIPITOR) 40 MG tablet Take 1 tablet (40 mg total) by mouth daily. 10/23/23 05/04/24 Yes Levora Reyes SAUNDERS, MD  Continuous Glucose Sensor (FREESTYLE LIBRE 3 SENSOR) MISC 1 Application by Does not apply route every 14 (fourteen) days. Place 1 sensor on the skin every 14 days. Use to check glucose continuously 02/11/24  Yes Levora Reyes SAUNDERS, MD  empagliflozin  (JARDIANCE ) 10 MG TABS tablet Take 1 tablet (10 mg total) by mouth daily. 02/11/24  Yes Levora Reyes SAUNDERS, MD  metFORMIN  (GLUCOPHAGE -XR) 500 MG 24 hr tablet Take 2 tablets (1,000 mg total) by mouth daily with breakfast. 02/03/24  Yes Levora Reyes SAUNDERS, MD  albuterol  (VENTOLIN  HFA) 108 (90 Base) MCG/ACT inhaler INHALE 1-2 PUFFS BY MOUTH EVERY 6 HOURS AS NEEDED FOR WHEEZE OR SHORTNESS OF BREATH    [provider]  azithromycin  (ZITHROMAX ) 250 MG tablet TAKE 2 TABLETS ON DAY 1, THEN 1 TABLET DAILY ON DAYS 2 THROUGH 5    [provider]  clopidogrel  (PLAVIX ) 75 MG tablet TAKE 1 TABLET (75 MG TOTAL) BY MOUTH DAILY FOR 21 DAYS.    [provider]  cyclobenzaprine  (FLEXERIL ) 5 MG tablet TAKE 0.5-1 TABLETS (2.5-5 MG TOTAL) BY MOUTH AT BEDTIME AS NEEDED FOR  MUSCLE SPASMS.     [provider]  meloxicam  (MOBIC ) 15 MG tablet TAKE 1 TABLET EVERY DAY BY ORAL ROUTE WITH MEAL(S) FOR 30 DAYS.    [provider]   Social History   Socioeconomic History   Marital status: Married    Spouse name: [name]   Number of children: 2   Years of education: post grad   Highest education level: Master's degree (e.g., MA, MS, MEng, MEd, MSW, MBA)  Occupational History    Comment: IT professional  Tobacco Use   Smoking status: Former    Current packs/day: 0.00    Average packs/day: 1 pack/day for 15.0 years (15.0 ttl pk-yrs)    Types: Cigarettes    Quit date: 08/04/2014    Years since quitting: 9.7   Smokeless tobacco: Never   Tobacco comments:    03/05/16 very occasional now, 06/06/16 not smoking  Vaping Use   Vaping status: Never Used  Substance and Sexual Activity   Alcohol use: Not Currently    Alcohol/week: 2.0 standard drinks of alcohol    Types: 2 Standard drinks or equivalent per week    Comment: social   Drug use: No   Sexual activity: Yes    Partners: Female    Birth control/protection: None    Comment: married  Other Topics Concern   Not on file  Social History Narrative   Lives with wife and two kids   Drinks 1 cup of coffee a day    Social Drivers of Corporate investment banker Strain: Low Risk  (02/03/2024)   Overall Financial Resource Strain (CARDIA)    Difficulty of Paying Living Expenses: Not hard at all  Food Insecurity: No Food Insecurity (02/03/2024)   Hunger Vital Sign    Worried About Running Out of Food in the Last Year: Never true    Ran Out of Food in the Last Year: Never true  Transportation Needs: No Transportation Needs (02/03/2024)   PRAPARE - Administrator, Civil Service (Medical): No    Lack of Transportation (Non-Medical): No  Physical Activity: Sufficiently Active (02/03/2024)   Exercise Vital Sign    Days of Exercise per Week: 3 days    Minutes of Exercise per Session: 50  min   Stress: No Stress Concern Present (02/03/2024)   [employer] of Occupational Health - Occupational Stress Questionnaire    Feeling of Stress: Only a little  Social Connections: Moderately Integrated (02/03/2024)   Social Connection and Isolation Panel    Frequency of Communication with Friends and Family: Three times a week    Frequency of Social Gatherings with Friends and Family: Three times a week    Attends Religious Services: More than 4 times per year    Active Member of Clubs or Organizations: No    Attends Banker Meetings: Not on file    Marital Status: Married  Intimate Partner Violence: Not At Risk (07/08/2023)   Humiliation, Afraid, Rape, and Kick questionnaire    Fear of Current or Ex-Partner: No    Emotionally Abused: No    Physically Abused: No    Sexually Abused: No    Review of Systems  13 point review of systems per patient health survey noted.  Negative other than as indicated above or in HPI.   Objective:   Vitals:   05/04/24 0804  BP: 126/82  Pulse: 67  Temp: 97.7 F (36.5 C)  TempSrc: Temporal  SpO2: 98%  Weight: 213 lb (96.6 kg)  Height: 5' 10 (1.778 m)   {Vitals History (Optional):23777}  Physical Exam Vitals reviewed.  Constitutional:      Appearance: He is well-developed.  HENT:     Head: Normocephalic and atraumatic.     Right Ear: External ear normal.     Left Ear: External ear normal.  Eyes:     Conjunctiva/sclera: Conjunctivae normal.     Pupils: Pupils are equal, round, and reactive to light.  Neck:     Thyroid: No thyromegaly.  Cardiovascular:     Rate and Rhythm: Normal rate and regular rhythm.     Heart sounds: Normal heart sounds.  Pulmonary:     Effort: Pulmonary effort is normal. No respiratory distress.     Breath sounds: Normal breath sounds. No wheezing.  Abdominal:     General: There is no distension.     Palpations: Abdomen is soft.     Tenderness: There is no abdominal tenderness.   Musculoskeletal:        General: No tenderness. Normal range of motion.     Cervical back: Normal range of motion and neck supple.  Lymphadenopathy:     Cervical: No cervical adenopathy.  Skin:    General: Skin is warm and dry.  Neurological:     Mental Status: He is alert and oriented to person, place, and time.     Deep Tendon Reflexes: Reflexes are normal and symmetric.  Psychiatric:        Behavior: Behavior normal.        Assessment & Plan:  Brian Reid is a 48 y.o. male . Annual physical exam  Type 2 diabetes mellitus with other neurologic complication, without long-term current use of insulin  (HCC) - Plan: Continuous Glucose Sensor (FREESTYLE LIBRE 3 SENSOR) MISC, Hemoglobin A1c, Comprehensive metabolic panel with GFR, Lipid panel, atorvastatin  (LIPITOR) 40 MG tablet, metFORMIN  (GLUCOPHAGE -XR) 500 MG 24 hr tablet, empagliflozin  (JARDIANCE ) 10 MG TABS tablet  Type 2 diabetes mellitus with hyperglycemia, without long-term current use of insulin  (HCC) - Plan: Continuous Glucose Sensor (FREESTYLE LIBRE 3 SENSOR) MISC, empagliflozin  (JARDIANCE ) 10 MG TABS tablet  Cerebrovascular accident (CVA), unspecified mechanism (HCC) - Plan: atorvastatin  (LIPITOR) 40 MG tablet  Essential hypertension - Plan: CBC, amLODipine  (NORVASC ) 2.5 MG tablet  Hyperlipidemia, unspecified hyperlipidemia type - Plan: Comprehensive metabolic panel with GFR, Lipid panel, atorvastatin  (LIPITOR) 40 MG tablet  Screen for colon cancer - Plan: Cologuard   Meds ordered this encounter  Medications   Continuous Glucose Sensor (FREESTYLE LIBRE 3 SENSOR) MISC    Sig: 1 Application by Does not apply route every 14 (fourteen) days. Place 1 sensor on the skin every 14 days. Use to check glucose continuously    Dispense:  6 each    Refill:  1   atorvastatin  (LIPITOR) 40 MG tablet    Sig: Take 1 tablet (40 mg total) by mouth daily.    Dispense:  90 tablet    Refill:  1   amLODipine  (NORVASC ) 2.5 MG tablet     Sig: Take 1 tablet (2.5 mg total) by mouth daily.    Dispense:  180 tablet    Refill:  3   metFORMIN  (GLUCOPHAGE -XR) 500 MG 24 hr tablet    Sig: Take 2 tablets (1,000 mg total) by mouth daily with breakfast.    Dispense:  180 tablet    Refill:  2   empagliflozin  (JARDIANCE ) 10 MG TABS tablet    Sig: Take 1 tablet (10 mg total) by mouth daily.    Dispense:  90 tablet    Refill:  1   Patient Instructions  Thanks for coming in today.  I did order the Cologuard for colon cancer screening.  Labs at a fasting lab visit this Friday and will adjust meds accordingly.  No changes for now.  See information below on hepatitis B vaccine.  If that something you would like to have, we can perform that at a nurse visit.  Follow-up with me in 3 months for diabetes, happy to see you sooner if any concerns.  Take care!  Hepatitis B Vaccine Injection What is this medication? HEPATITIS B VACCINE (hep uh TAHY tis B vak SEEN) reduces the risk of hepatitis B. It does not treat hepatitis B. It is still possible to get hepatitis B after receiving this vaccine, but the symptoms may be less severe or not last as long. It works by helping your immune system learn how to fight off a future infection. This medicine may be used for other purposes; ask your health care provider or pharmacist if you have questions. COMMON BRAND NAME(S): Engerix-B, Engerix-B Pediatric, HEPLISAV-B, PreHevbrio, Recombivax HB, Recombivax HB Pediatric/Adolescent What should I tell my care team before I take this medication? They need to know if you have any of these conditions: Fever, infection Heart disease Hepatitis B infection Immune system problems Kidney disease An unusual or allergic reaction to vaccines, other medications, foods, dyes, or preservatives Pregnant or trying to get pregnant Breastfeeding How should I use this medication? This vaccine is injected into a muscle. It is given by your care team. A copy of Vaccine Information  Statements will be given before each vaccination. Be sure to read this sheet carefully each time. This sheet may change often. Talk to your care team about the use of this medication in children. While it may be prescribed for children as young as newborn for selected conditions, precautions do apply. Overdosage: If you think you have taken too much of this medicine contact a poison control center or emergency room at once. NOTE: This medicine is only for you. Do not share this medicine with others. What if I miss a dose? Keep appointments for follow-up (booster) doses as directed. It is important not to miss your dose.  Call your care team if you are unable to keep an appointment. What may interact with this medication? Certain medications that suppress your immune function, such as adalimumab, anakinra, infliximab Certain medications to treat cancer Steroid medications, such as prednisone or cortisone This list may not describe all possible interactions. Give your health care provider a list of all the medicines, herbs, non-prescription drugs, or dietary supplements you use. Also tell them if you smoke, drink alcohol, or use illegal drugs. Some items may interact with your medicine. What should I watch for while using this medication? See your care team for all shots of this vaccine as directed. You must have 3 shots of this vaccine for protection from hepatitis B infection. Tell your care team right away if you have any serious or unusual side effects after getting this vaccine. What side effects may I notice from receiving this medication? Side effects that you should report to your care team as soon as possible: Allergic reactions--skin rash, itching, hives, swelling of the face, lips, tongue, or throat Burning or tingling sensation in hands or feet Feeling faint or lightheaded Trouble breathing Side effects that usually do not require medical attention (report these to your care team if they  continue or are bothersome): Dizziness Fatigue Fever Headache Pain, redness, irritation, or bruising at the injection site This list may not describe all possible side effects. Call your doctor for medical advice about side effects. You may report side effects to FDA at 1-800-FDA-1088. Where should I keep my medication? This vaccine is only given by your care team. It will not be stored at home. NOTE: This sheet is a summary. It may not cover all possible information. If you have questions about this medicine, talk to your doctor, pharmacist, or health care provider.  2024 Elsevier/Gold Standard (2021-12-31 00:00:00)Preventive Care 76-65 Years Old, Male Preventive care refers to lifestyle choices and visits with your health care provider that can promote health and wellness. Preventive care visits are also called wellness exams. What can I expect for my preventive care visit? Counseling During your preventive care visit, your health care provider may ask about your: Medical history, including: Past medical problems. Family medical history. Current health, including: Emotional well-being. Home life and relationship well-being. Sexual activity. Lifestyle, including: Alcohol, nicotine or tobacco, and drug use. Access to firearms. Diet, exercise, and sleep habits. Safety issues such as seatbelt and bike helmet use. Sunscreen use. Work and work Astronomer. Physical exam Your health care provider will check your: Height and weight. These may be used to calculate your BMI (body mass index). BMI is a measurement that tells if you are at a healthy weight. Waist circumference. This measures the distance around your waistline. This measurement also tells if you are at a healthy weight and may help predict your risk of certain diseases, such as type 2 diabetes and high blood pressure. Heart rate and blood pressure. Body temperature. Skin for abnormal spots. What immunizations do I  need?  Vaccines are usually given at various ages, according to a schedule. Your health care provider will recommend vaccines for you based on your age, medical history, and lifestyle or other factors, such as travel or where you work. What tests do I need? Screening Your health care provider may recommend screening tests for certain conditions. This may include: Lipid and cholesterol levels. Diabetes screening. This is done by checking your blood sugar (glucose) after you have not eaten for a while (fasting). Hepatitis B test. Hepatitis C test.  HIV (human immunodeficiency virus) test. STI (sexually transmitted infection) testing, if you are at risk. Lung cancer screening. Prostate cancer screening. Colorectal cancer screening. Talk with your health care provider about your test results, treatment options, and if necessary, the need for more tests. Follow these instructions at home: Eating and drinking  Eat a diet that includes fresh fruits and vegetables, whole grains, lean protein, and low-fat dairy products. Take vitamin and mineral supplements as recommended by your health care provider. Do not drink alcohol if your health care provider tells you not to drink. If you drink alcohol: Limit how much you have to 0-2 drinks a day. Know how much alcohol is in your drink. In the [place] one drink equals one 12 oz bottle of beer (355 mL), one 5 oz glass of wine (148 mL), or one 1 oz glass of hard liquor (44 mL). Lifestyle Brush your teeth every morning and night with fluoride toothpaste. Floss one time each day. Exercise for at least 30 minutes 5 or more days each week. Do not use any products that contain nicotine or tobacco. These products include cigarettes, chewing tobacco, and vaping devices, such as e-cigarettes. If you need help quitting, ask your health care provider. Do not use drugs. If you are sexually active, practice safe sex. Use a condom or other form of protection to prevent  STIs. Take aspirin  only as told by your health care provider. Make sure that you understand how much to take and what form to take. Work with your health care provider to find out whether it is safe and beneficial for you to take aspirin  daily. Find healthy ways to manage stress, such as: Meditation, yoga, or listening to music. Journaling. Talking to a trusted person. Spending time with friends and family. Minimize exposure to UV radiation to reduce your risk of skin cancer. Safety Always wear your seat belt while driving or riding in a vehicle. Do not drive: If you have been drinking alcohol. Do not ride with someone who has been drinking. When you are tired or distracted. While texting. If you have been using any mind-altering substances or drugs. Wear a helmet and other protective equipment during sports activities. If you have firearms in your house, make sure you follow all gun safety procedures. What's next? Go to your health care provider once a year for an annual wellness visit. Ask your health care provider how often you should have your eyes and teeth checked. Stay up to date on all vaccines. This information is not intended to replace advice given to you by your health care provider. Make sure you discuss any questions you have with your health care provider. Document Revised: 01/16/2021 Document Reviewed: 01/16/2021 Elsevier Patient Education  2024 Elsevier Inc.    Signed,   Reyes Pines, MD [facility], [facility] 05/04/24 8:40 AM

## 2024-05-06 ENCOUNTER — Ambulatory Visit: Admitting: Family Medicine

## 2024-05-06 ENCOUNTER — Other Ambulatory Visit

## 2024-05-06 DIAGNOSIS — E1149 Type 2 diabetes mellitus with other diabetic neurological complication: Secondary | ICD-10-CM

## 2024-05-06 DIAGNOSIS — E785 Hyperlipidemia, unspecified: Secondary | ICD-10-CM

## 2024-05-06 DIAGNOSIS — I10 Essential (primary) hypertension: Secondary | ICD-10-CM | POA: Diagnosis not present

## 2024-05-06 DIAGNOSIS — Z125 Encounter for screening for malignant neoplasm of prostate: Secondary | ICD-10-CM

## 2024-05-06 LAB — CBC
HCT: 44.5 % (ref 39.0–52.0)
Hemoglobin: 14.7 g/dL (ref 13.0–17.0)
MCHC: 32.9 g/dL (ref 30.0–36.0)
MCV: 78.5 fl (ref 78.0–100.0)
Platelets: 187 K/uL (ref 150.0–400.0)
RBC: 5.67 Mil/uL (ref 4.22–5.81)
RDW: 13.7 % (ref 11.5–15.5)
WBC: 5.9 K/uL (ref 4.0–10.5)

## 2024-05-06 LAB — COMPREHENSIVE METABOLIC PANEL WITH GFR
ALT: 26 U/L (ref 0–53)
AST: 17 U/L (ref 0–37)
Albumin: 4.6 g/dL (ref 3.5–5.2)
Alkaline Phosphatase: 50 U/L (ref 39–117)
BUN: 14 mg/dL (ref 6–23)
CO2: 31 meq/L (ref 19–32)
Calcium: 9.4 mg/dL (ref 8.4–10.5)
Chloride: 103 meq/L (ref 96–112)
Creatinine, Ser: 1.07 mg/dL (ref 0.40–1.50)
GFR: 82.11 mL/min (ref >60.00)
Glucose, Bld: 103 mg/dL — ABNORMAL HIGH (ref 70–99)
Potassium: 4.2 meq/L (ref 3.5–5.1)
Sodium: 141 meq/L (ref 135–145)
Total Bilirubin: 0.8 mg/dL (ref 0.2–1.2)
Total Protein: 6.8 g/dL (ref 6.0–8.3)

## 2024-05-06 LAB — LIPID PANEL
Cholesterol: 104 mg/dL (ref 0–200)
HDL: 28.8 mg/dL — ABNORMAL LOW (ref >39.00)
LDL Cholesterol: 51 mg/dL (ref 0–99)
NonHDL: 75.03
Total CHOL/HDL Ratio: 4
Triglycerides: 122 mg/dL (ref 0.0–149.0)
VLDL: 24.4 mg/dL (ref 0.0–40.0)

## 2024-05-06 LAB — HEMOGLOBIN A1C: Hgb A1c MFr Bld: 6.7 % — ABNORMAL HIGH (ref 4.6–6.5)

## 2024-05-06 NOTE — Addendum Note (Signed)
 Addended by: Kimbely Whiteaker R on: 05/06/2024 08:57 AM   Modules accepted: Orders

## 2024-05-10 ENCOUNTER — Other Ambulatory Visit

## 2024-05-10 DIAGNOSIS — R030 Elevated blood-pressure reading, without diagnosis of hypertension: Secondary | ICD-10-CM | POA: Diagnosis not present

## 2024-05-10 LAB — PSA: PSA: 0.35 ng/mL (ref <=4.00)

## 2024-05-12 ENCOUNTER — Other Ambulatory Visit: Payer: Self-pay | Admitting: Family Medicine

## 2024-05-12 ENCOUNTER — Other Ambulatory Visit (HOSPITAL_COMMUNITY): Payer: Self-pay

## 2024-05-12 DIAGNOSIS — E1165 Type 2 diabetes mellitus with hyperglycemia: Secondary | ICD-10-CM

## 2024-05-12 DIAGNOSIS — E1149 Type 2 diabetes mellitus with other diabetic neurological complication: Secondary | ICD-10-CM

## 2024-05-12 NOTE — Telephone Encounter (Signed)
 error

## 2024-05-12 NOTE — Telephone Encounter (Signed)
 Insurance does not cover  Sent to PA team .

## 2024-05-13 ENCOUNTER — Ambulatory Visit: Payer: Self-pay | Admitting: Family Medicine

## 2024-05-13 ENCOUNTER — Other Ambulatory Visit (HOSPITAL_COMMUNITY): Payer: Self-pay

## 2024-05-13 DIAGNOSIS — E113293 Type 2 diabetes mellitus with mild nonproliferative diabetic retinopathy without macular edema, bilateral: Secondary | ICD-10-CM | POA: Diagnosis not present

## 2024-05-13 DIAGNOSIS — H40013 Open angle with borderline findings, low risk, bilateral: Secondary | ICD-10-CM | POA: Diagnosis not present

## 2024-05-13 DIAGNOSIS — D3121 Benign neoplasm of right retina: Secondary | ICD-10-CM | POA: Diagnosis not present

## 2024-05-13 NOTE — Telephone Encounter (Signed)
 Patient okay to continue Jardiance ? Just wanting to double confirm

## 2024-05-13 NOTE — Telephone Encounter (Signed)
 Pharmacy Patient Advocate Encounter  Received notification from [facility]  that Prior Authorization for Jardiance  10MG  tablets has been APPROVED from 05/13/24 to 05/13/25. Ran test claim, Copay is $29.99. This test claim was processed through [facility]- copay amounts may vary at other pharmacies due to pharmacy/plan contracts, or as the patient moves through the different stages of their insurance plan.   PA #/Case ID/Reference #: [id number]

## 2024-05-18 LAB — COLOGUARD: COLOGUARD: NEGATIVE

## 2024-06-09 ENCOUNTER — Encounter: Admitting: Family Medicine

## 2024-06-23 DIAGNOSIS — D3121 Benign neoplasm of right retina: Secondary | ICD-10-CM | POA: Diagnosis not present

## 2024-06-23 DIAGNOSIS — H40013 Open angle with borderline findings, low risk, bilateral: Secondary | ICD-10-CM | POA: Diagnosis not present

## 2024-06-23 DIAGNOSIS — E119 Type 2 diabetes mellitus without complications: Secondary | ICD-10-CM | POA: Diagnosis not present

## 2024-08-01 ENCOUNTER — Encounter: Payer: Self-pay | Admitting: Family Medicine

## 2024-08-01 ENCOUNTER — Ambulatory Visit: Admitting: Family Medicine

## 2024-08-01 ENCOUNTER — Ambulatory Visit: Payer: Self-pay

## 2024-08-01 VITALS — BP 134/88 | HR 77 | Temp 98.5°F | Resp 16 | Ht 70.0 in | Wt 218.0 lb

## 2024-08-01 DIAGNOSIS — M5412 Radiculopathy, cervical region: Secondary | ICD-10-CM | POA: Diagnosis not present

## 2024-08-01 DIAGNOSIS — M549 Dorsalgia, unspecified: Secondary | ICD-10-CM

## 2024-08-01 DIAGNOSIS — M4802 Spinal stenosis, cervical region: Secondary | ICD-10-CM

## 2024-08-01 DIAGNOSIS — R051 Acute cough: Secondary | ICD-10-CM

## 2024-08-01 MED ORDER — CYCLOBENZAPRINE HCL 5 MG PO TABS: 5.0000 mg | ORAL_TABLET | Freq: Every evening | ORAL | 0 refills | Status: AC | PRN

## 2024-08-01 MED ORDER — PREDNISONE 20 MG PO TABS: 40.0000 mg | ORAL_TABLET | Freq: Every day | ORAL | 0 refills | Status: AC

## 2024-08-01 NOTE — Progress Notes (Signed)
 "  Subjective:  Patient ID: Brian Reid, male    DOB: 19-Aug-1975  Age: 48 y.o. MRN: [id number]  CC:  Chief Complaint  Patient presents with   Arm Pain    Sx started 3 days ago. Left shoulder, neck and arm pain. Constant. Cannot fully extent neck and back. Not able to fully rotate arm. When patient coughs, neck pain is worse. No chest pain.     HPI Brian Reid presents for   Left shoulder/neck/arm pain Initial symptoms started 3 days ago.  Noted in upper left shoulder blade. Having lunch. No known injury. No prior heavy lifting, but had been exercising 2 time per week with lunges and light weights. No symptoms with exercise. No change in exercise. Pain radiates to back of the upper left arm. No weakness. Pain radiates to left neck at times as well.  Hurts to flex neck.  Pain in same area with cough. Rare cough with recent cold that has improved. Cough past 4 days, No fever. Mucinex  has helped. Cough has improved. No home testing.   No rash, no weakness.  Upper back pain has remained the same.   Tx: tylenol  - 2 per day with cough. Hot shower temporarily helps.   Cspine XR 06/27/20: FINDINGS: Straightening of the cervical spine, suboptimal visualization of cervicothoracic junction. Vertebral body heights are maintained. Mild degenerative changes C5-C6 and C6-C7. Possible mild right foraminal narrowing at C3-C4 and C4-C5. The dens and lateral masses are within normal limits.  IMPRESSION: Straightening of the cervical spine with mild degenerative changes as described above.  MRI cervical spine 07/06/23: IMPRESSION: 1. Focus of acute/early subacute ischemia at the junction of the left precentral and postcentral gyri. 2. 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 3. Moderate bilateral C7 and mild right C5, C6 neural foraminal stenosis.   Lab Results  Component Value Date   HGBA1C 6.7 (H) 05/06/2024  Home readings: 110-120, postprandial      History Patient Active Problem List   Diagnosis Date Noted   Pain in right shoulder 02/26/2024   Left Achilles tendinitis 12/07/2023   Pain of right heel 11/13/2023   HTN (hypertension) 08/25/2023   HLD (hyperlipidemia) 08/25/2023   Snoring 08/25/2023   TIA (transient ischemic attack) 07/06/2023   Elevated BP without diagnosis of hypertension 07/06/2023   Pain in left hand 09/09/2021   Left forearm pain 04/12/2021   Acute appendicitis s/p lap appendectomy 04/02/2016 04/03/2016   Past Medical History:  Diagnosis Date   Asthma    Diabetes mellitus without complication (HCC) 11-02-2021   HLD (hyperlipidemia) 08/25/2023   HTN (hypertension) 08/25/2023   Stroke (HCC) 07-06-2023   Past Surgical History:  Procedure Laterality Date   APPENDECTOMY N/A    [employer] 06/27/2020   LAPAROSCOPIC APPENDECTOMY N/A 04/02/2016   Procedure: APPENDECTOMY LAPAROSCOPIC;  Surgeon: Deward Null III, MD;  Location: [facility];  Service: General;  Laterality: N/A;   LEG SURGERY     age 20   OPEN REDUCTION SHOULDER DISLOCATION     age 62   TRANSESOPHAGEAL ECHOCARDIOGRAM (CATH LAB) N/A 07/16/2023   Procedure: TRANSESOPHAGEAL ECHOCARDIOGRAM;  Surgeon: Sheena Pugh, DO;  Location: [facility];  Service: Cardiovascular;  Laterality: N/A;   Allergies[1] Prior to Admission medications  Medication Sig Start Date End Date Taking? Authorizing Provider  amLODipine  (NORVASC ) 2.5 MG tablet Take 1 tablet (2.5 mg total) by mouth daily. 05/04/24 08/02/24 Yes Levora Reyes SAUNDERS, MD  aspirin  EC 81 MG tablet Take 81  mg by mouth daily. Swallow whole.   Yes [provider]  atorvastatin  (LIPITOR) 40 MG tablet Take 1 tablet (40 mg total) by mouth daily. 05/04/24 08/02/24 Yes Levora Reyes SAUNDERS, MD  metFORMIN  (GLUCOPHAGE -XR) 500 MG 24 hr tablet Take 2 tablets (1,000 mg total) by mouth daily with breakfast. 05/04/24  Yes Levora Reyes SAUNDERS, MD  Continuous Glucose Sensor (FREESTYLE LIBRE 3 SENSOR) MISC 1 Application by  Does not apply route every 14 (fourteen) days. Place 1 sensor on the skin every 14 days. Use to check glucose continuously Patient not taking: Reported on 08/01/2024 05/04/24   Levora Reyes SAUNDERS, MD  JARDIANCE  10 MG TABS tablet TAKE 1 TABLET BY MOUTH EVERY DAY Patient not taking: Reported on 08/01/2024 05/13/24   Levora Reyes SAUNDERS, MD   Social History   Socioeconomic History   Marital status: Married    Spouse name: [name]   Number of children: 2   Years of education: post grad   Highest education level: Master's degree (e.g., MA, MS, MEng, MEd, MSW, MBA)  Occupational History    Comment: IT professional  Tobacco Use   Smoking status: Former    Current packs/day: 0.00    Average packs/day: 1 pack/day for 15.0 years (15.0 ttl pk-yrs)    Types: Cigarettes    Quit date: 08/04/2014    Years since quitting: 10.0   Smokeless tobacco: Never   Tobacco comments:    03/05/16 very occasional now, 06/06/16 not smoking  Vaping Use   Vaping status: Never Used  Substance and Sexual Activity   Alcohol use: Not Currently    Alcohol/week: 2.0 standard drinks of alcohol    Types: 2 Standard drinks or equivalent per week    Comment: social   Drug use: No   Sexual activity: Yes    Partners: Female    Birth control/protection: None    Comment: married  Other Topics Concern   Not on file  Social History Narrative   Lives with wife and two kids   Drinks 1 cup of coffee a day    Social Drivers of Health   Tobacco Use: Medium Risk (08/01/2024)   Patient History    Smoking Tobacco Use: Former    Smokeless Tobacco Use: Never    Passive Exposure: Not on Actuary Strain: Low Risk (02/03/2024)   Overall Financial Resource Strain (CARDIA)    Difficulty of Paying Living Expenses: Not hard at all  Food Insecurity: No Food Insecurity (02/03/2024)   Epic    Worried About Radiation Protection Practitioner of Food in the Last Year: Never true    Ran Out of Food in the Last Year: Never true  Transportation Needs:  No Transportation Needs (02/03/2024)   Epic    Lack of Transportation (Medical): No    Lack of Transportation (Non-Medical): No  Physical Activity: Sufficiently Active (02/03/2024)   Exercise Vital Sign    Days of Exercise per Week: 3 days    Minutes of Exercise per Session: 50 min  Stress: No Stress Concern Present (02/03/2024)   [employer] of Occupational Health - Occupational Stress Questionnaire    Feeling of Stress: Only a little  Social Connections: Moderately Integrated (02/03/2024)   Social Connection and Isolation Panel    Frequency of Communication with Friends and Family: Three times a week    Frequency of Social Gatherings with Friends and Family: Three times a week    Attends Religious Services: More than 4 times per year  Active Member of Clubs or Organizations: No    Attends Banker Meetings: Not on file    Marital Status: Married  Intimate Partner Violence: Not At Risk (07/08/2023)   Humiliation, Afraid, Rape, and Kick questionnaire    Fear of Current or Ex-Partner: No    Emotionally Abused: No    Physically Abused: No    Sexually Abused: No  Depression (PHQ2-9): Low Risk (05/04/2024)   Depression (PHQ2-9)    PHQ-2 Score: 0  Alcohol Screen: Low Risk (02/03/2024)   Alcohol Screen    Last Alcohol Screening Score (AUDIT): 1  Housing: Low Risk (02/03/2024)   Epic    Unable to Pay for Housing in the Last Year: No    Number of Times Moved in the Last Year: 0    Homeless in the Last Year: No  Utilities: Not At Risk (07/07/2023)   [facility] Utilities    Threatened with loss of utilities: No  Health Literacy: Not on file    Review of Systems   Objective:   Vitals:   08/01/24 1512  BP: 134/88  Pulse: 77  Resp: 16  Temp: 98.5 F (36.9 C)  TempSrc: Temporal  SpO2: 98%  Weight: 218 lb (98.9 kg)  Height: 5' 10 (1.778 m)     Physical Exam Vitals reviewed.  Constitutional:      Appearance: He is well-developed.  HENT:     Head: Normocephalic and  atraumatic.  Neck:     Vascular: No carotid bruit or JVD.  Cardiovascular:     Rate and Rhythm: Normal rate and regular rhythm.     Heart sounds: Normal heart sounds. No murmur heard. Pulmonary:     Effort: Pulmonary effort is normal. No respiratory distress.     Breath sounds: Normal breath sounds. No stridor. No wheezing, rhonchi or rales.  Musculoskeletal:     Right lower leg: No edema.     Left lower leg: No edema.     Comments: C-spine, decreased extension greater than flexion, pain with flexion.  Reproduces pain in the upper back.  Decreased lateral rotation, lateral flexion, equal. Intact range of motion at shoulders bilaterally, equal upper extremity grip strength. Reproducible discomfort with pressure over left trapezius with slight spasm, no midline bony tenderness of cervical or thoracic spine. Skin intact without rash.  Skin:    General: Skin is warm and dry.  Neurological:     Mental Status: He is alert and oriented to person, place, and time.  Psychiatric:        Mood and Affect: Mood normal.        Assessment & Plan:  Brian Reid is a 48 y.o. male . Left cervical radiculopathy - Plan: cyclobenzaprine  (FLEXERIL ) 5 MG tablet, predniSONE (DELTASONE) 20 MG tablet  Upper back pain on left side - Plan: cyclobenzaprine  (FLEXERIL ) 5 MG tablet, predniSONE (DELTASONE) 20 MG tablet  Cervical spinal stenosis  Acute cough  History of cervical spinal stenosis with likely flare, cervical radiculopathy with pain into the upper back, arm without weakness.  Likely viral illness recently which is improving, lungs clear.  Cough improving.  However cough exacerbating symptoms does indicate likely neuronal impingement, flare of his cervical spinal stenosis.  Described structures involved with diagram on Essential Anatomy program.  Without specific injury, trauma, or weakness will hold on imaging at this time.  Trial of muscle relaxant with potential side effects discussed, then  prednisone if not improved in the next 24 to 48 hours with potential side  effects and risk discussed, monitoring of blood sugars given underlying diabetes.  RTC precautions were given.  Meds ordered this encounter  Medications   cyclobenzaprine  (FLEXERIL ) 5 MG tablet    Sig: Take 1 tablet (5 mg total) by mouth at bedtime as needed for muscle spasms (start qhs prn due to sedation).    Dispense:  15 tablet    Refill:  0   predniSONE (DELTASONE) 20 MG tablet    Sig: Take 2 tablets (40 mg total) by mouth daily with breakfast.    Dispense:  10 tablet    Refill:  0   Patient Instructions  I do think that the upper back pain, neck pain, arm pain are related to the cervical spinal stenosis or narrowing of some of the areas in your bones of the neck which may be causing a pinched nerve.  Can try muscle relaxer tonight, heat and gentle range of motion but if not improving in the next 24 to 48 hours, start prednisone 2 pills/day for 5 days.  Monitor your blood sugar during that time as that medicine will cause those readings to go up slightly.  If above 250 let me know.  If neck pain is not improving into next week or worsening sooner please be seen.  Take care!  Spinal Stenosis  Spinal stenosis is a condition that happens when the spinal canal narrows. The spinal canal is the space between the bones of your spine (vertebrae). This narrowing puts pressure on the spinal cord and nerves that exit the spine and run down the arms or legs. When nerves exiting the spine are pinched, it can cause pain, numbness, or weakness in the arms or legs. Spinal stenosis can affect the vertebrae in the neck, upper back, and lower back. Spinal stenosis can range from mild to severe. What are the causes? This condition is caused by areas of bone pushing into the spinal canal. This condition may be present at birth (congenital), or it may be caused by: Slow breakdown of your vertebrae (spinal degeneration). This usually starts  between 8 and 30 years of age. Injury (trauma) to your spine. Previous spinal surgery. Tumors in your spine. Calcium  deposits in your spine. What increases the risk? The following factors may make you more likely to develop this condition: Being older than age 8. Being born with an abnormally shaped spine (congenitalspinal deformity), such as scoliosis. Having arthritis. What are the signs or symptoms? Symptoms of this condition include: Pain in the neck or back that is generally worse with activities, particularly when standing or walking. Numbness, tingling, hot or cold sensations, weakness, or tiredness (fatigue) in your arms or legs. This can happen in one arm or leg, or both. Pain going from the buttock, down the thigh, and to the calf (sciatica). This can happen in one or both legs. Falling frequently. Foot drop. This is when you have trouble lifting the front part of your foot and it drags on the ground when you walk. This can lead to muscle weakness. In more severe cases, you may develop: Problems having a bowel movement or urinating. Difficulty having sex. Loss of feeling in your legs and inability to walk. Symptoms may come on slowly and get worse over time. In some cases, there are no symptoms. How is this diagnosed? This condition is diagnosed based on your medical history and a physical exam. You may also have tests, such as an X-ray, CT scan, or MRI. How is this treated? Treatment for  this condition often focuses on managing your pain and any other symptoms. Treatment may include: Practicing good posture to lessen pressure on your nerves. Exercises to strengthen muscles, build endurance, improve balance, and maintain range of motion. This may include physical therapy to restore movement and strength to your back. Losing weight, if needed. Medicines to reduce inflammation or pain. This may include a medicine that is injected into your spine (steroidinjection). Assistive  devices, such as a corset or brace. In some cases, surgery may be needed. The most common procedure is decompression laminectomy. This removes excess bone that puts pressure on your nerve roots. Follow these instructions at home: Managing pain, stiffness, and swelling  Practice good posture. If you were given a brace or a corset, wear it as told by your health care provider. Maintain a healthy weight. Talk with your health care provider if you need help losing weight. If directed, apply heat to the affected area as often as told by your health care provider. Use the heat source that your health care provider recommends, such as a moist heat pack or a heating pad. Place a towel between your skin and the heat source. Leave the heat on for 20-30 minutes. If your skin turns bright red, remove the heat right away to prevent burns. The risk of burns is higher if you cannot feel pain, heat, or cold. Activity Do all exercises and stretches as told by your health care provider. Do not do any activities that cause pain. You may have to avoid lifting. Ask your health care provider how much you can safely lift. Return to your normal activities as told by your health care provider. Ask your health care provider what activities are safe for you. General instructions Take over-the-counter and prescription medicines only as told by your health care provider. Do not use any products that contain nicotine or tobacco. These products include cigarettes, chewing tobacco, and vaping devices, such as e-cigarettes. If you need help quitting, ask your health care provider. Eat a healthy diet. This includes plenty of fruits and vegetables, whole grains, and low-fat (lean) protein. Where to find more information [employer] of Arthritis and Musculoskeletal and Skin Diseases: www.niams.http://www.myers.net/ Contact a health care provider if: Your symptoms do not get better or they get worse. You have a fever. Get help right  away if: You have new pain or symptoms of severe pain, such as: New or worsening pain in your neck or upper back. Severe pain that cannot be controlled with medicines. A severe headache that gets worse when you stand. You are dizzy. You have vision problems, such as blurred vision or double vision. You have nausea or vomiting. You develop new or worsening numbness or tingling in your back or legs. You lose control of your bowels or bladder. You have pain, redness, swelling, or warmth in your arm or leg. These symptoms may be an emergency. Get help right away. Call 911. Do not wait to see if the symptoms will go away. Do not drive yourself to the hospital. Summary Spinal stenosis is a condition that happens when the spinal canal narrows, putting pressure on the spinal cord or nerves that exit the vertebrae. This condition is caused by areas of bone pushing into the spinal canal. Spinal stenosis can cause numbness, weakness, or pain in the buttocks, neck, back, arms, and legs. This condition is usually diagnosed with your medical history, a physical exam, and tests, such as an X-ray, CT scan, or MRI. This information  is not intended to replace advice given to you by your health care provider. Make sure you discuss any questions you have with your health care provider. Document Revised: 10/15/2021 Document Reviewed: 10/15/2021 Elsevier Patient Education  2024 Else[employer] Inc.    Signed,   Reyes Pines, MD [facility], [facility] 08/01/2024 4:18 PM       [1] No Known Allergies  "

## 2024-08-01 NOTE — Patient Instructions (Addendum)
 I do think that the upper back pain, neck pain, arm pain are related to the cervical spinal stenosis or narrowing of some of the areas in your bones of the neck which may be causing a pinched nerve.  Can try muscle relaxer tonight, heat and gentle range of motion but if not improving in the next 24 to 48 hours, start prednisone 2 pills/day for 5 days.  Monitor your blood sugar during that time as that medicine will cause those readings to go up slightly.  If above 250 let me know.  If neck pain is not improving into next week or worsening sooner please be seen.  Take care!  Spinal Stenosis  Spinal stenosis is a condition that happens when the spinal canal narrows. The spinal canal is the space between the bones of your spine (vertebrae). This narrowing puts pressure on the spinal cord and nerves that exit the spine and run down the arms or legs. When nerves exiting the spine are pinched, it can cause pain, numbness, or weakness in the arms or legs. Spinal stenosis can affect the vertebrae in the neck, upper back, and lower back. Spinal stenosis can range from mild to severe. What are the causes? This condition is caused by areas of bone pushing into the spinal canal. This condition may be present at birth (congenital), or it may be caused by: Slow breakdown of your vertebrae (spinal degeneration). This usually starts between 96 and 46 years of age. Injury (trauma) to your spine. Previous spinal surgery. Tumors in your spine. Calcium  deposits in your spine. What increases the risk? The following factors may make you more likely to develop this condition: Being older than age 64. Being born with an abnormally shaped spine (congenitalspinal deformity), such as scoliosis. Having arthritis. What are the signs or symptoms? Symptoms of this condition include: Pain in the neck or back that is generally worse with activities, particularly when standing or walking. Numbness, tingling, hot or cold  sensations, weakness, or tiredness (fatigue) in your arms or legs. This can happen in one arm or leg, or both. Pain going from the buttock, down the thigh, and to the calf (sciatica). This can happen in one or both legs. Falling frequently. Foot drop. This is when you have trouble lifting the front part of your foot and it drags on the ground when you walk. This can lead to muscle weakness. In more severe cases, you may develop: Problems having a bowel movement or urinating. Difficulty having sex. Loss of feeling in your legs and inability to walk. Symptoms may come on slowly and get worse over time. In some cases, there are no symptoms. How is this diagnosed? This condition is diagnosed based on your medical history and a physical exam. You may also have tests, such as an X-ray, CT scan, or MRI. How is this treated? Treatment for this condition often focuses on managing your pain and any other symptoms. Treatment may include: Practicing good posture to lessen pressure on your nerves. Exercises to strengthen muscles, build endurance, improve balance, and maintain range of motion. This may include physical therapy to restore movement and strength to your back. Losing weight, if needed. Medicines to reduce inflammation or pain. This may include a medicine that is injected into your spine (steroidinjection). Assistive devices, such as a corset or brace. In some cases, surgery may be needed. The most common procedure is decompression laminectomy. This removes excess bone that puts pressure on your nerve roots. Follow these instructions  at home: Managing pain, stiffness, and swelling  Practice good posture. If you were given a brace or a corset, wear it as told by your health care provider. Maintain a healthy weight. Talk with your health care provider if you need help losing weight. If directed, apply heat to the affected area as often as told by your health care provider. Use the heat source  that your health care provider recommends, such as a moist heat pack or a heating pad. Place a towel between your skin and the heat source. Leave the heat on for 20-30 minutes. If your skin turns bright red, remove the heat right away to prevent burns. The risk of burns is higher if you cannot feel pain, heat, or cold. Activity Do all exercises and stretches as told by your health care provider. Do not do any activities that cause pain. You may have to avoid lifting. Ask your health care provider how much you can safely lift. Return to your normal activities as told by your health care provider. Ask your health care provider what activities are safe for you. General instructions Take over-the-counter and prescription medicines only as told by your health care provider. Do not use any products that contain nicotine or tobacco. These products include cigarettes, chewing tobacco, and vaping devices, such as e-cigarettes. If you need help quitting, ask your health care provider. Eat a healthy diet. This includes plenty of fruits and vegetables, whole grains, and low-fat (lean) protein. Where to find more information [employer] of Arthritis and Musculoskeletal and Skin Diseases: www.niams.http://www.myers.net/ Contact a health care provider if: Your symptoms do not get better or they get worse. You have a fever. Get help right away if: You have new pain or symptoms of severe pain, such as: New or worsening pain in your neck or upper back. Severe pain that cannot be controlled with medicines. A severe headache that gets worse when you stand. You are dizzy. You have vision problems, such as blurred vision or double vision. You have nausea or vomiting. You develop new or worsening numbness or tingling in your back or legs. You lose control of your bowels or bladder. You have pain, redness, swelling, or warmth in your arm or leg. These symptoms may be an emergency. Get help right away. Call 911. Do  not wait to see if the symptoms will go away. Do not drive yourself to the hospital. Summary Spinal stenosis is a condition that happens when the spinal canal narrows, putting pressure on the spinal cord or nerves that exit the vertebrae. This condition is caused by areas of bone pushing into the spinal canal. Spinal stenosis can cause numbness, weakness, or pain in the buttocks, neck, back, arms, and legs. This condition is usually diagnosed with your medical history, a physical exam, and tests, such as an X-ray, CT scan, or MRI. This information is not intended to replace advice given to you by your health care provider. Make sure you discuss any questions you have with your health care provider. Document Revised: 10/15/2021 Document Reviewed: 10/15/2021 Elsevier Patient Education  2024 [employer].

## 2024-08-01 NOTE — Telephone Encounter (Signed)
 Pt reports left shoulder, neck and arm pain x 3 days.   FYI Only or Action Required?: FYI only for provider: appointment scheduled on 08/01/24.  Patient was last seen in primary care on 05/04/2024 by Levora Reyes SAUNDERS, MD.  Called Nurse Triage reporting Pain.  Symptoms began several days ago.  Interventions attempted: Rest, hydration, or home remedies.  Symptoms are: gradually worsening.  Triage Disposition: See PCP When Office is Open (Within 3 Days)  Patient/caregiver understands and will follow disposition?:  Reason for Disposition  [1] MODERATE pain (e.g., interferes with normal activities) AND [2] present > 3 days  Answer Assessment - Initial Assessment Questions 1. ONSET: When did the pain start?     3 days 2. LOCATION: Where is the pain located?     Left neck, shoulder and arm pain. Worse when coughing. 3. PAIN: How bad is the pain? (Scale 0-10; or none, mild, moderate, severe)     Limiting ADL 5. CAUSE: What do you think is causing the arm pain?     Unknown 6. OTHER SYMPTOMS: Do you have any other symptoms? (e.g., neck pain, swelling, rash, fever, numbness, weakness)     See above  Protocols used: Arm Pain-A-AH Copied from CRM #[id number]. Topic: Clinical - Red Word Triage >> Aug 01, 2024 12:09 PM Rea ORN wrote: Red Word that prompted transfer to Nurse Triage: left arm pain with coughing. The pt said when he coughs the pain gets worse in arm for the past 3 days

## 2024-08-05 ENCOUNTER — Ambulatory Visit: Admitting: Family Medicine

## 2024-09-09 ENCOUNTER — Ambulatory Visit: Admitting: Family Medicine

## 2024-09-14 ENCOUNTER — Ambulatory Visit: Admitting: Family Medicine
